# Patient Record
Sex: Male | Born: 1980
Health system: Southern US, Community
[De-identification: ages and names within clinical notes are randomized; demographics above are authoritative.]

## PROBLEM LIST (undated history)

## (undated) DIAGNOSIS — F418 Other specified anxiety disorders: Secondary | ICD-10-CM

## (undated) DIAGNOSIS — E785 Hyperlipidemia, unspecified: Secondary | ICD-10-CM

## (undated) DIAGNOSIS — I10 Essential (primary) hypertension: Secondary | ICD-10-CM

## (undated) DIAGNOSIS — Z903 Acquired absence of stomach [part of]: Secondary | ICD-10-CM

## (undated) DIAGNOSIS — R7303 Prediabetes: Secondary | ICD-10-CM

## (undated) DIAGNOSIS — Z8042 Family history of malignant neoplasm of prostate: Secondary | ICD-10-CM

## (undated) HISTORY — DX: Acquired absence of stomach (part of): Z90.3

## (undated) HISTORY — DX: Family history of malignant neoplasm of prostate: Z80.42

## (undated) HISTORY — DX: Hyperlipidemia, unspecified: E78.5

## (undated) HISTORY — DX: Other specified anxiety disorders: F41.8

## (undated) HISTORY — PX: WISDOM TOOTH EXTRACTION: SHX21

## (undated) HISTORY — DX: Essential (primary) hypertension: I10

---

## 2006-03-09 ENCOUNTER — Emergency Department (HOSPITAL_COMMUNITY): Admission: EM | Admit: 2006-03-09 | Discharge: 2006-03-09 | Payer: Self-pay | Admitting: Emergency Medicine

## 2012-12-15 ENCOUNTER — Encounter (HOSPITAL_COMMUNITY): Payer: Self-pay | Admitting: Emergency Medicine

## 2012-12-15 ENCOUNTER — Emergency Department (HOSPITAL_COMMUNITY)
Admission: EM | Admit: 2012-12-15 | Discharge: 2012-12-15 | Disposition: A | Discharge status: Home / Self Care | Attending: Emergency Medicine | Admitting: Emergency Medicine

## 2012-12-15 DIAGNOSIS — Y92410 Unspecified street and highway as the place of occurrence of the external cause: Secondary | ICD-10-CM | POA: Insufficient documentation

## 2012-12-15 DIAGNOSIS — S0990XA Unspecified injury of head, initial encounter: Secondary | ICD-10-CM | POA: Insufficient documentation

## 2012-12-15 DIAGNOSIS — I10 Essential (primary) hypertension: Secondary | ICD-10-CM | POA: Insufficient documentation

## 2012-12-15 DIAGNOSIS — S161XXA Strain of muscle, fascia and tendon at neck level, initial encounter: Secondary | ICD-10-CM

## 2012-12-15 DIAGNOSIS — Y9389 Activity, other specified: Secondary | ICD-10-CM | POA: Insufficient documentation

## 2012-12-15 DIAGNOSIS — S139XXA Sprain of joints and ligaments of unspecified parts of neck, initial encounter: Secondary | ICD-10-CM | POA: Insufficient documentation

## 2012-12-15 HISTORY — DX: Essential (primary) hypertension: I10

## 2012-12-15 MED ORDER — NAPROXEN 500 MG PO TABS: 500.0000 mg | ORAL_TABLET | Freq: Two times a day (BID) | ORAL | Status: DC
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
## 2012-12-16 NOTE — ED Provider Notes (Signed)
Medical screening examination/treatment/procedure(s) were performed by non-physician practitioner and as supervising physician I was immediately available for consultation/collaboration.   Gavin Pound. Oletta Lamas, MD 12/16/12 [phone]

## 2012-12-27 ENCOUNTER — Ambulatory Visit (INDEPENDENT_AMBULATORY_CARE_PROVIDER_SITE_OTHER): Payer: No Typology Code available for payment source | Admitting: Family Medicine

## 2012-12-27 VITALS — BP 142/92 | HR 84 | Temp 98.9°F | Resp 16 | Ht 76.0 in | Wt 358.0 lb

## 2012-12-27 DIAGNOSIS — Z2089 Contact with and (suspected) exposure to other communicable diseases: Secondary | ICD-10-CM

## 2012-12-27 DIAGNOSIS — Z202 Contact with and (suspected) exposure to infections with a predominantly sexual mode of transmission: Secondary | ICD-10-CM

## 2012-12-27 MED ORDER — METRONIDAZOLE 500 MG PO TABS: ORAL_TABLET | ORAL | Status: DC

## 2012-12-27 NOTE — Progress Notes (Signed)
  [facility]:  Office Visit  Chief Complaint:  Chief Complaint  Patient presents with  . Exposure to STD    HPI: Donald [name] is a 32 y.o. male who complains of: He needs to be treated for infection. His wife was treated for Trichomonas yesterday. She was having dc and went to her doctor. He realizes it is a sexually transmitted disease. He is in mongamous relationship with his wife, no one else. He deneis havoing any prior STDs, was tested along time ago.  He is asymtpomatic  Past Medical History  Diagnosis Date  . Hypertension    History reviewed. No pertinent past surgical history. History   Social History  . Marital Status: Married    Spouse Name: N/A    Number of Children: N/A  . Years of Education: N/A   Social History Main Topics  . Smoking status: Never Smoker   . Smokeless tobacco: None  . Alcohol Use: No  . Drug Use: None  . Sexual Activity: None   Other Topics Concern  . None   Social History Narrative  . None   Family History  Problem Relation Age of Onset  . Hyperlipidemia Mother   . Diabetes Mother   . Diabetes Father   . Prostate cancer Father   . Prostate cancer Paternal Grandfather    No Known Allergies Prior to Admission medications   Medication Sig Start Date End Date Taking? Authorizing Provider  naproxen (NAPROSYN) 500 MG tablet Take 1 tablet (500 mg total) by mouth 2 (two) times daily. 12/15/12   Trevor Mace, PA-C     ROS: The patient denies fevers, chills, night sweats, unintentional weight loss, chest pain, palpitations, wheezing, dyspnea on exertion, nausea, vomiting, abdominal pain, dysuria, hematuria, melena, numbness, weakness, or tingling.  All other systems have been reviewed and were otherwise negative with the exception of those mentioned in the HPI and as above.    PHYSICAL EXAM: Filed Vitals:   12/27/12 1815  BP: 142/92  Pulse: 84  Temp: 98.9 F (37.2 C)  Resp: 16   Filed Vitals:   12/27/12  1815  Height: 6\' 4"  (1.93 m)  Weight: 358 lb (162.388 kg)   Body mass index is 43.6 kg/(m^2).  General: Alert, no acute distress HEENT:  Normocephalic, atraumatic, oropharynx patent. EOMI, PERRLA Cardiovascular:  Regular rate and rhythm, no rubs murmurs or gallops.  No Carotid bruits, radial pulse intact. No pedal edema.  Respiratory: Clear to auscultation bilaterally.  No wheezes, rales, or rhonchi.  No cyanosis, no use of accessory musculature GI: No organomegaly, abdomen is soft and non-tender, positive bowel sounds.  No masses. Skin: No rashes. Neurologic: Facial musculature symmetric. Psychiatric: Patient is appropriate throughout our interaction. Lymphatic: No cervical lymphadenopathy Musculoskeletal: Gait intact.   LABS: No results found for this or any previous visit.   EKG/XRAY:   Primary read interpreted by Dr. Conley Rolls at [facility].   ASSESSMENT/PLAN: Encounter Diagnosis  Name Primary?  . Exposure to STD Yes   Rx Flagyl 500 mg 4 pills x 1 dose Labs pending: trich/G/C urine probe Gross sideeffects, risk and benefits, and alternatives of medications d/w patient. Patient is aware that all medications have potential sideeffects and we are unable to predict every sideeffect or drug-drug interaction that may occur.  Hamilton Capri PHUONG, DO 12/27/2012 6:31 PM

## 2012-12-27 NOTE — Patient Instructions (Signed)
Trichomoniasis °Trichomoniasis is an infection, caused by the Trichomonas organism, that affects both women and men. In women, the outer male genitalia and the vagina are affected. In men, the penis is mainly affected, but the prostate and other reproductive organs can also be involved. Trichomoniasis is a sexually transmitted disease (STD) and is most often passed to another person through sexual contact. The majority of people who get trichomoniasis do so from a sexual encounter and are also at risk for other STDs. °CAUSES  °· Sexual intercourse with an infected partner. °· It can be present in swimming pools or hot tubs. °SYMPTOMS  °· Abnormal gray-green frothy vaginal discharge in women. °· Vaginal itching and irritation in women. °· Itching and irritation of the area outside the vagina in women. °· Penile discharge with or without pain in males. °· Inflammation of the urethra (urethritis), causing painful urination. °· Bleeding after sexual intercourse. °RELATED COMPLICATIONS °· Pelvic inflammatory disease. °· Infection of the uterus (endometritis). °· Infertility. °· Tubal (ectopic) pregnancy. °· It can be associated with other STDs, including gonorrhea and chlamydia, hepatitis B, and HIV. °COMPLICATIONS DURING PREGNANCY °· Early (premature) delivery. °· Premature rupture of the membranes (PROM). °· Low birth weight. °DIAGNOSIS  °· Visualization of Trichomonas under the microscope from the vagina discharge. °· Ph of the vagina greater than 4.5, tested with a test tape. °· Trich Rapid Test. °· Culture of the organism, but this is not usually needed. °· It may be found on a Pap test. °· Having a "strawberry cervix,"which means the cervix looks very red like a strawberry. °TREATMENT  °· You may be given medication to fight the infection. Inform your caregiver if you could be or are pregnant. Some medications used to treat the infection should not be taken during pregnancy. °· Over-the-counter medications or  creams to decrease itching or irritation may be recommended. °· Your sexual partner will need to be treated if infected. °HOME CARE INSTRUCTIONS  °· Take all medication prescribed by your caregiver. °· Take over-the-counter medication for itching or irritation as directed by your caregiver. °· Do not have sexual intercourse while you have the infection. °· Do not douche or wear tampons. °· Discuss your infection with your partner, as your partner may have acquired the infection from you. Or, your partner may have been the person who transmitted the infection to you. °· Have your sex partner examined and treated if necessary. °· Practice safe, informed, and protected sex. °· See your caregiver for other STD testing. °SEEK MEDICAL CARE IF:  °· You still have symptoms after you finish the medication. °· You have an oral temperature above 102° F (38.9° C). °· You develop belly (abdominal) pain. °· You have pain when you urinate. °· You have bleeding after sexual intercourse. °· You develop a rash. °· The medication makes you sick or makes you throw up (vomit). °Document Released: 09/28/2000 Document Revised: 06/27/2011 Document Reviewed: 10/24/2008 °ExitCare® Patient Information ©2014 [employer]. ° °

## 2012-12-28 LAB — TRICHOMONAS VAGINALIS, PROBE AMP: T vaginalis RNA: NEGATIVE

## 2012-12-30 ENCOUNTER — Encounter: Payer: Self-pay | Admitting: Family Medicine

## 2013-01-02 LAB — GC/CHLAMYDIA PROBE AMP
CT Probe RNA: NEGATIVE
GC Probe RNA: NEGATIVE

## 2013-09-05 ENCOUNTER — Ambulatory Visit: Payer: Self-pay | Admitting: Family Medicine

## 2014-09-24 ENCOUNTER — Telehealth: Payer: Self-pay | Admitting: Family Medicine

## 2014-09-24 NOTE — Telephone Encounter (Signed)
Lab work ordered 09/09/14 through [employer] EMR have resulted as a hard copy from [facility].  This patient's most recent lab results have been reviewed and are within normal ranges including:  Blood counts Liver function Kidney function Thyroid function Prostate Antigen  Electrolytes Cholesterol  Hba1c is high at 6.3%, he is nearly diabetic! His Hba1c results in the past have been 6.0% and 5.6%, so this recent result is the highest it has been. I will keep an eye on this at least yearly with lab work.    I highly recommend that he really changes his diet around. Work on content of food and portion control consisting of low fat, low carb, low sugar diet along with regular DAILY exercise.   A copy of the results can be provided to the patient if they would like.

## 2014-09-25 NOTE — Telephone Encounter (Signed)
Per the request of Dr. Edwena Felty, I contacted this patient to review the results from this patient's recent blood work and after he verified his date of birth, labs were reviewed. Patient was encouraged to be mindful of what and how much he ate along with incorporating regular daily exercise. Patient then asked if I can set his appointment for next year. Patient has been scheduled for Wednesday, Sep 09, 2015 @ 8am.

## 2014-10-30 ENCOUNTER — Encounter: Payer: Self-pay | Admitting: Family Medicine

## 2015-01-27 ENCOUNTER — Ambulatory Visit: Payer: Self-pay | Admitting: Family Medicine

## 2015-01-27 ENCOUNTER — Other Ambulatory Visit: Payer: Self-pay | Admitting: Family Medicine

## 2015-01-27 DIAGNOSIS — E785 Hyperlipidemia, unspecified: Secondary | ICD-10-CM | POA: Insufficient documentation

## 2015-01-27 DIAGNOSIS — Z6841 Body Mass Index (BMI) 40.0 and over, adult: Principal | ICD-10-CM

## 2015-01-27 DIAGNOSIS — G4733 Obstructive sleep apnea (adult) (pediatric): Secondary | ICD-10-CM | POA: Insufficient documentation

## 2015-02-09 ENCOUNTER — Ambulatory Visit: Payer: Self-pay | Admitting: Family Medicine

## 2015-02-20 ENCOUNTER — Ambulatory Visit
Admission: RE | Admit: 2015-02-20 | Discharge: 2015-02-20 | Disposition: A | Discharge status: Home / Self Care | Payer: 59 | Source: Ambulatory Visit | Attending: Family Medicine | Admitting: Family Medicine

## 2015-02-20 ENCOUNTER — Ambulatory Visit (INDEPENDENT_AMBULATORY_CARE_PROVIDER_SITE_OTHER): Payer: 59 | Admitting: Family Medicine

## 2015-02-20 ENCOUNTER — Encounter: Payer: Self-pay | Admitting: Family Medicine

## 2015-02-20 VITALS — BP 122/90 | HR 91 | Temp 98.5°F | Resp 16 | Wt 357.3 lb

## 2015-02-20 DIAGNOSIS — M10032 Idiopathic gout, left wrist: Secondary | ICD-10-CM

## 2015-02-20 DIAGNOSIS — R946 Abnormal results of thyroid function studies: Secondary | ICD-10-CM | POA: Diagnosis not present

## 2015-02-20 DIAGNOSIS — H60339 Swimmer's ear, unspecified ear: Secondary | ICD-10-CM | POA: Insufficient documentation

## 2015-02-20 DIAGNOSIS — M79672 Pain in left foot: Secondary | ICD-10-CM | POA: Insufficient documentation

## 2015-02-20 DIAGNOSIS — E785 Hyperlipidemia, unspecified: Secondary | ICD-10-CM | POA: Diagnosis not present

## 2015-02-20 DIAGNOSIS — Z8042 Family history of malignant neoplasm of prostate: Secondary | ICD-10-CM | POA: Insufficient documentation

## 2015-02-20 DIAGNOSIS — M546 Pain in thoracic spine: Secondary | ICD-10-CM | POA: Insufficient documentation

## 2015-02-20 DIAGNOSIS — R030 Elevated blood-pressure reading, without diagnosis of hypertension: Secondary | ICD-10-CM | POA: Diagnosis not present

## 2015-02-20 DIAGNOSIS — R197 Diarrhea, unspecified: Secondary | ICD-10-CM | POA: Insufficient documentation

## 2015-02-20 DIAGNOSIS — M25532 Pain in left wrist: Secondary | ICD-10-CM | POA: Insufficient documentation

## 2015-02-20 DIAGNOSIS — R7303 Prediabetes: Secondary | ICD-10-CM

## 2015-02-20 DIAGNOSIS — E669 Obesity, unspecified: Secondary | ICD-10-CM | POA: Insufficient documentation

## 2015-02-20 DIAGNOSIS — G4730 Sleep apnea, unspecified: Secondary | ICD-10-CM | POA: Insufficient documentation

## 2015-02-20 DIAGNOSIS — M2062 Acquired deformities of toe(s), unspecified, left foot: Secondary | ICD-10-CM | POA: Insufficient documentation

## 2015-02-20 DIAGNOSIS — M1A09X0 Idiopathic chronic gout, multiple sites, without tophus (tophi): Secondary | ICD-10-CM | POA: Insufficient documentation

## 2015-02-20 DIAGNOSIS — F430 Acute stress reaction: Secondary | ICD-10-CM | POA: Insufficient documentation

## 2015-02-20 DIAGNOSIS — R7989 Other specified abnormal findings of blood chemistry: Secondary | ICD-10-CM | POA: Insufficient documentation

## 2015-02-20 DIAGNOSIS — Z23 Encounter for immunization: Secondary | ICD-10-CM

## 2015-02-20 MED ORDER — PREDNISONE 10 MG (21) PO TBPK: ORAL_TABLET | ORAL | Status: DC

## 2015-02-20 MED ORDER — IBUPROFEN 800 MG PO TABS: 800.0000 mg | ORAL_TABLET | Freq: Three times a day (TID) | ORAL | Status: DC | PRN

## 2015-02-20 MED ORDER — ALLOPURINOL 100 MG PO TABS: 100.0000 mg | ORAL_TABLET | Freq: Every day | ORAL | Status: DC

## 2015-02-20 NOTE — Progress Notes (Signed)
Name: Donald [name]   MRN: [id number]    DOB: December 20, 1980   Date:02/20/2015       Progress Note  Subjective  Chief Complaint  Chief Complaint  Patient presents with  . Gout    left foot pain, no injury. patient stated it has calmed down some.  . Wrist Pain    left wrist pain for 2 weeks. no swelling, but is sore. limited range of motion. patient does do repetitive motion at work Music therapist)    HPI   Donald [name] is a 34 year old male with complaints of left foot pain. He first saw me for left foot pain last year at which time a fracture was identified but not at the site where he reported pain (he reported pain around base of 1st digit). Relevent information includes history of left foot 5th digit base fracture 09/06/14 along with uric acid level 9.2 mg/dL on 1/47/82 as well. Donald [name] was referred to [facility] and he reports establishing care but did not receive a call about follow up so he never returned for further care. He is currently not on any chronic suppressive medication for Gout. He also complains of left wrist pain, onset 2 weeks ago, feels tight with limited ROM. No overt redness or swelling. Still works as a Paramedic. Still has not made significant strides with weight loss as previously recommended. He is requesting FMLA papers filled out as he is having gout attacks about every other month keeping his out of work about 3 days.   Past Medical History  Diagnosis Date  . Hypertension     Patient Active Problem List   Diagnosis Date Noted  . Acute thoracic back pain 02/20/2015  . Family history of malignant neoplasm of prostate 02/20/2015  . Benign hypertension 02/20/2015  . D (diarrhea) 02/20/2015  . Morbid obesity (HCC) 02/20/2015  . Beach ear 02/20/2015  . Arthralgia of foot 02/20/2015  . Chemical diabetes (HCC) 02/20/2015  . Acute situational disturbance 02/20/2015  . Apnea, sleep 02/20/2015  . Elevated TSH 02/20/2015  . Morbid obesity with BMI of  50.0-59.9, adult (HCC) 01/27/2015  . Obstructive sleep apnea 01/27/2015  . Hyperlipidemia LDL goal <100 01/27/2015    Social History  Substance Use Topics  . Smoking status: Never Smoker   . Smokeless tobacco: Not on file  . Alcohol Use: No    No current outpatient prescriptions on file.  No Known Allergies  Review of Systems  CONSTITUTIONAL: No significant weight changes, fever, chills, weakness or fatigue.  SKIN: No rash or itching.  CARDIOVASCULAR: No chest pain, chest pressure or chest discomfort. No palpitations or edema.  RESPIRATORY: No shortness of breath, cough or sputum.  NEUROLOGICAL: No headache, dizziness, syncope, paralysis, ataxia, numbness or tingling in the extremities. No memory changes. No change in bowel or bladder control.  MUSCULOSKELETAL: Yes joint pain. No muscle pain. PSYCHIATRIC: No change in mood. No change in sleep pattern.  ENDOCRINOLOGIC: No reports of sweating, cold or heat intolerance. No polyuria or polydipsia.      Objective  BP 122/90 mmHg  Pulse 91  Temp(Src) 98.5 F (36.9 C) (Oral)  Resp 16  Wt 357 lb 4.8 oz (162.07 kg)  SpO2 97%  Body mass index is 43.51 kg/(m^2).   Physical Exam  Constitutional: Patient has a large build and is obese and well-nourished. In no distress.  Cardiovascular: Normal rate, regular rhythm and normal heart sounds.  No murmur heard.  Pulmonary/Chest: Effort normal and breath  sounds normal. No respiratory distress. Musculoskeletal: Normal range of motion bilateral UE and LE, no joint effusions. Bilateral wrists, MCP, DIP, PIP joints full ROM with no swelling or nodules. Left foot no reproducible tenderness, no swelling at toe joints. Peripheral vascular: Bilateral LE no edema. Neurological: CN II-XII grossly intact with no focal deficits. Alert and oriented to person, place, and time. Coordination, balance, strength, speech and gait are normal.  Skin: Skin is warm and dry. No rash noted. No erythema.   Psychiatric: Patient has a normal mood and affect. Behavior is normal in office today. Judgment and thought content normal in office today.    Assessment & Plan   1. Left foot pain Repeat imaging and if fracture not well healed will refer back to podiatry.  - CBC with Differential/Platelet - Comprehensive metabolic panel - ANA - Rheumatoid factor - Sedimentation rate - Uric acid - DG Foot Complete Left; Future - ibuprofen (ADVIL,MOTRIN) 800 MG tablet; Take 1 tablet (800 mg total) by mouth every 8 (eight) hours as needed.  Dispense: 50 tablet; Refill: 3 - predniSONE (STERAPRED UNI-PAK 21 TAB) 10 MG (21) TBPK tablet; Use as directed in a 6 day taper PredPak  Dispense: 21 tablet; Refill: 0  2. Left wrist pain Start Ibuprofen for 3 days and wean down if symptoms improving. If not improving add on Prednisone taper.   - CBC with Differential/Platelet - Comprehensive metabolic panel - ANA - Rheumatoid factor - Sedimentation rate - Uric acid - DG Wrist Complete Left; Future - ibuprofen (ADVIL,MOTRIN) 800 MG tablet; Take 1 tablet (800 mg total) by mouth every 8 (eight) hours as needed.  Dispense: 50 tablet; Refill: 3 - predniSONE (STERAPRED UNI-PAK 21 TAB) 10 MG (21) TBPK tablet; Use as directed in a 6 day taper PredPak  Dispense: 21 tablet; Refill: 0  3. Acute idiopathic gout of left wrist Start allopurinol today. The patient has been counseled on the proper use, side effects and potential interactions of the new medication. Patient encouraged to review the side effects and safety profile pamphlet provided with the prescription from the pharmacy as well as request counseling from the pharmacy team as needed.   - CBC with Differential/Platelet - Comprehensive metabolic panel - ANA - Rheumatoid factor - Sedimentation rate - Uric acid - ibuprofen (ADVIL,MOTRIN) 800 MG tablet; Take 1 tablet (800 mg total) by mouth every 8 (eight) hours as needed.  Dispense: 50 tablet; Refill: 3 -  predniSONE (STERAPRED UNI-PAK 21 TAB) 10 MG (21) TBPK tablet; Use as directed in a 6 day taper PredPak  Dispense: 21 tablet; Refill: 0 - allopurinol (ZYLOPRIM) 100 MG tablet; Take 1 tablet (100 mg total) by mouth daily.  Dispense: 30 tablet; Refill: 3  4. Pre-diabetes  - Hemoglobin A1c  5. Elevated blood pressure reading without diagnosis of hypertension Diastolic borderline today.  - CBC with Differential/Platelet - Comprehensive metabolic panel  6. Hyperlipidemia LDL goal <100 Repeat.  - Lipid panel  7. Elevated TSH Previously elevated, antibodies negative 07/21/14. Subclinical hypothyroidism needs monitoring.   - TSH - T3, free - T4, free

## 2015-02-20 NOTE — Patient Instructions (Signed)

## 2015-02-21 LAB — CBC WITH DIFFERENTIAL/PLATELET
BASOS: 0 %
Basophils Absolute: 0 10*3/uL (ref 0.0–0.2)
EOS (ABSOLUTE): 0.1 10*3/uL (ref 0.0–0.4)
EOS: 2 %
HEMATOCRIT: 47.6 % (ref 37.5–51.0)
HEMOGLOBIN: 15.8 g/dL (ref 12.6–17.7)
Immature Grans (Abs): 0 10*3/uL (ref 0.0–0.1)
Immature Granulocytes: 0 %
LYMPHS ABS: 1.9 10*3/uL (ref 0.7–3.1)
Lymphs: 42 %
MCH: 25.8 pg — ABNORMAL LOW (ref 26.6–33.0)
MCHC: 33.2 g/dL (ref 31.5–35.7)
MCV: 78 fL — ABNORMAL LOW (ref 79–97)
MONOCYTES: 7 %
Monocytes Absolute: 0.3 10*3/uL (ref 0.1–0.9)
Neutrophils Absolute: 2.3 10*3/uL (ref 1.4–7.0)
Neutrophils: 49 %
Platelets: 218 10*3/uL (ref 150–379)
RBC: 6.13 x10E6/uL — AB (ref 4.14–5.80)
RDW: 16 % — ABNORMAL HIGH (ref 12.3–15.4)
WBC: 4.6 10*3/uL (ref 3.4–10.8)

## 2015-02-21 LAB — COMPREHENSIVE METABOLIC PANEL
ALBUMIN: 4.6 g/dL (ref 3.5–5.5)
ALT: 31 IU/L (ref 0–44)
AST: 17 IU/L (ref 0–40)
Albumin/Globulin Ratio: 1.8 (ref 1.1–2.5)
Alkaline Phosphatase: 80 IU/L (ref 39–117)
BUN / CREAT RATIO: 11 (ref 8–19)
BUN: 12 mg/dL (ref 6–20)
Bilirubin Total: 0.2 mg/dL (ref 0.0–1.2)
CALCIUM: 9.6 mg/dL (ref 8.7–10.2)
CO2: 26 mmol/L (ref 18–29)
CREATININE: 1.14 mg/dL (ref 0.76–1.27)
Chloride: 99 mmol/L (ref 97–106)
GFR calc Af Amer: 97 mL/min/{1.73_m2} (ref >59)
GFR, EST NON AFRICAN AMERICAN: 84 mL/min/{1.73_m2} (ref >59)
GLOBULIN, TOTAL: 2.5 g/dL (ref 1.5–4.5)
Glucose: 107 mg/dL — ABNORMAL HIGH (ref 65–99)
Potassium: 5 mmol/L (ref 3.5–5.2)
SODIUM: 140 mmol/L (ref 136–144)
Total Protein: 7.1 g/dL (ref 6.0–8.5)

## 2015-02-21 LAB — HEMOGLOBIN A1C
Est. average glucose Bld gHb Est-mCnc: 134 mg/dL
Hgb A1c MFr Bld: 6.3 % — ABNORMAL HIGH (ref 4.8–5.6)

## 2015-02-21 LAB — LIPID PANEL
CHOL/HDL RATIO: 4.2 ratio (ref 0.0–5.0)
Cholesterol, Total: 167 mg/dL (ref 100–199)
HDL: 40 mg/dL (ref >39)
LDL Calculated: 102 mg/dL — ABNORMAL HIGH (ref 0–99)
Triglycerides: 127 mg/dL (ref 0–149)
VLDL CHOLESTEROL CAL: 25 mg/dL (ref 5–40)

## 2015-02-21 LAB — URIC ACID: URIC ACID: 9.1 mg/dL — AB (ref 3.7–8.6)

## 2015-02-21 LAB — T4, FREE: Free T4: 1.01 ng/dL (ref 0.82–1.77)

## 2015-02-21 LAB — SEDIMENTATION RATE: Sed Rate: 5 mm/hr (ref 0–15)

## 2015-02-21 LAB — TSH: TSH: 3.17 u[IU]/mL (ref 0.450–4.500)

## 2015-02-21 LAB — RHEUMATOID FACTOR: Rhuematoid fact SerPl-aCnc: <10 IU/mL (ref 0.0–13.9)

## 2015-02-21 LAB — T3, FREE: T3, Free: 3.4 pg/mL (ref 2.0–4.4)

## 2015-02-22 LAB — ANA: ANA: NEGATIVE

## 2015-02-24 ENCOUNTER — Encounter: Payer: Self-pay | Admitting: Family Medicine

## 2015-02-24 ENCOUNTER — Telehealth: Payer: Self-pay

## 2015-02-24 NOTE — Telephone Encounter (Signed)
I contacted this patient to review the results from his recent labs. Patient informed me that he has been taking the Allopurinol 100mg  as directed. Patient was informed of lab results and asked if he could referred to a nutritionist or a dietician to help aid him in weight loss.   Also this patient mentioned that he turned his FMLA paperwork in too late, but was told by HR that if he has to be out again he just had have us complete the forms again. I told him that it must be documented and an appointment may be needed in order to complete the paperwork again.

## 2015-02-26 ENCOUNTER — Other Ambulatory Visit: Payer: Self-pay | Admitting: Family Medicine

## 2015-02-26 DIAGNOSIS — R7303 Prediabetes: Secondary | ICD-10-CM

## 2015-02-26 DIAGNOSIS — E785 Hyperlipidemia, unspecified: Secondary | ICD-10-CM

## 2015-02-26 DIAGNOSIS — M1A09X0 Idiopathic chronic gout, multiple sites, without tophus (tophi): Secondary | ICD-10-CM

## 2015-02-26 NOTE — Telephone Encounter (Signed)
Patient was informed and said thanks. 

## 2015-02-26 NOTE — Telephone Encounter (Signed)
Please let him know that I have [facility] for Nutrition Counseling.

## 2015-04-03 ENCOUNTER — Encounter: Payer: 59 | Attending: Family Medicine | Admitting: Dietician

## 2015-04-03 ENCOUNTER — Encounter: Payer: Self-pay | Admitting: Dietician

## 2015-04-03 VITALS — Ht 76.0 in | Wt 366.0 lb

## 2015-04-03 DIAGNOSIS — R7303 Prediabetes: Secondary | ICD-10-CM | POA: Insufficient documentation

## 2015-04-03 DIAGNOSIS — E669 Obesity, unspecified: Secondary | ICD-10-CM | POA: Insufficient documentation

## 2015-04-03 NOTE — Patient Instructions (Signed)
   Make healthy breakfast and snack choices. Try English muffin or whole grain bagel sandwiches and add fruit.   Try fruit with cheese stick or a handful of nuts for snacks, or make your own trail mix with whole grain cereal (like Cheerios) mixed with nuts and dried fruit.   Choose more grilled, baked foods for lunches. Add a salad or other vegetable or fruit.   When you feel ready, work on dinner by eating slowly, or try using a smaller plate, or try to stick with one plate rather than 2.   Begin some type of exercise on a regular basis. Start with a short duration, even 15 minutes, and increase from there.

## 2015-04-03 NOTE — Progress Notes (Signed)
Medical Nutrition Therapy: Visit start time: 0900  end time: 1000  Assessment:  Diagnosis: pre-diabetes, obesity Past medical history: gout, sleep apnea Psychosocial issues/ stress concerns: patient reports low stress level Preferred learning method:  . Auditory . Visual . Hands-on . No preference indicated  Current weight: 366lbs  Height: 6'4" Medications, supplements: reviewed with patient Progress and evaluation: Patient reports elevated BGs for the past year.          He has been relying on fast food and convenience store foods Donald [name]([employer]) for many meals, due to work.         He wants to lose weight to prevent diabetes.    Physical activity: some work-related (delivering packages); no structured exercise.  Dietary Intake:  Usual eating pattern includes 3 meals and 3 snacks per day. Dining out frequency: 12-14 meals per week.  Breakfast: 9am, fast food, such as biscuit or cold pizza,  or convenience store food Snack: cookies, deli sandwich, pop tarts Lunch: fast food Snack: same as am, or snack cakes Supper: chicken or pork chops, few vegetables, rice often Snack: often pop tarts; craving sweets recently Beverages: water virtually only water.   Nutrition Care Education: Topics covered: weight management, diabetes prevention Basic nutrition: basic food groups, appropriate nutrient balance, appropriate meal and snack schedule, general nutrition guidelines    Weight control: behavioral changes for weight loss: portion control strategies, reasonable goals; 2100kcal meal plan with 50%CHO, 20% protein, 30% fat.       Importance of low fat and low sugar foods, and increasing vegetable and fruit intake as well as fiber. Advanced nutrition:  dining out Diabetes prevention:  appropriate carb intake and balance, healthy carb choices, ways to decrease intake of sweets, healthy meal and snack options. Other lifestyle changes:  Role of physical exercise in weight management and diabetes  prevention  Nutritional Diagnosis:  Donald [name]-3.3 Overweight/obesity As related to excess caloric intake.  As evidenced by patient report, high BMI.  Intervention: Instruction as noted above.   Set goals with patient input; encouraged him to work on goals gradually if needed.      Education Materials given:  . General diet guidelines for Diabetes . Food lists/ Planning A Balanced Meal . Sample meal pattern/ menus: Quick and Healthy Meal Ideas . Goals/ instructions  Learner/ who was taught:  . Patient    Level of understanding: Donald [name] Kitchen. Verbalizes/ demonstrates competency  Demonstrated degree of understanding via:   Teach back Learning barriers: . None  Willingness to learn/ readiness for change: . Acceptance, ready for change   Monitoring and Evaluation:  Dietary intake, exercise, BG control, and body weight      follow up: 05/15/15

## 2015-05-15 ENCOUNTER — Encounter: Payer: 59 | Attending: Family Medicine | Admitting: Dietician

## 2015-05-15 DIAGNOSIS — E669 Obesity, unspecified: Secondary | ICD-10-CM | POA: Insufficient documentation

## 2015-05-15 DIAGNOSIS — R7303 Prediabetes: Secondary | ICD-10-CM | POA: Diagnosis not present

## 2015-05-15 NOTE — Patient Instructions (Signed)
   Keep up your great healthy changes!!  Continue to gradually increase physical activity, try adding some strength-building exercises like desk push-ups, etc. You can also try searching online for exercises coordinated for your fitness level.  Allow for occasional treats  You can aim for 1800 calories daily for weight loss, don't go much below that level.

## 2015-05-15 NOTE — Progress Notes (Signed)
Medical Nutrition Therapy: Visit start time: 1100  end time: 1130  Assessment:  Diagnosis: pre-diabetes, obesity Medical history changes: no changes Psychosocial issues/ stress concerns: none  Current weight: 355lbs  Height: 6'4" Medications, supplement changes: no changes  Progress and evaluation: Patient reports going 21 days without junk food as new year's resolution with some friends, did eat more last week after 21 days ended.          Weight loss of 11lbs since initial visit; he reports lowest weight of 349 last week, but has regained some since last week.         Increased vegetable and fruit intake, and choosing restaurants with healthy options.          Wife is cooking more meals at home and adding vegetables to meals.   Physical activity: minimal changes; has been trying to increase speed of walking when working.  Dietary Intake:  Usual eating pattern includes 3 meals and 2 snacks per day. Dining out frequency: 5-6 meals per week.  Breakfast: deli sandwich on whole wheat bread, banana or pineapple. Trying to keep to 320kcals.  Snack: none Lunch: subway or [employer], sometimes salad and baked chicken at [employer]. If caloric info available, aims for 400 or less.  Snack: none Supper: wife cooks; adding more vegetables. Chicken, pork chops; some smaller portions of starches. No calorie counting, but has kept to 1 - 1 1/2 plates of food rather than 2-3 plates he was previously eating. Snack: none Beverages: water  Nutrition Care Education: Topics covered: weight management Weight control: Reviewed caloric needs for weight loss; previously goal was 2100kcal. Advised patient to avoid going below 1800kcal daily to avoid "under" -eating.        Discussed additional ways to prepare and eat fruit as desserts. Discussed options for further increasing physical activity.   Nutritional Diagnosis:  [place]-3.3 Overweight/obesity As related to history of excess caloric intake, inactivity.  As  evidenced by patient report.  Intervention: Instruction as noted above.   Commended patient for changes and efforts made thus far.    Encouraged ongoing gradual increase in physical activity.      Education Materials given:  Marland Kitchen Goals/ instructions  Learner/ who was taught:  . Patient   Level of understanding: Marland Kitchen Verbalizes/ demonstrates competency  Demonstrated degree of understanding via:   Teach back Learning barriers: . None  Willingness to learn/ readiness for change: . Eager, change in progress  Monitoring and Evaluation:  Dietary intake, exercise, and body weight      follow up: 06/26/15

## 2015-06-26 ENCOUNTER — Encounter: Payer: Self-pay | Admitting: Family Medicine

## 2015-06-26 ENCOUNTER — Ambulatory Visit: Payer: Self-pay | Admitting: Dietician

## 2015-06-26 ENCOUNTER — Ambulatory Visit (INDEPENDENT_AMBULATORY_CARE_PROVIDER_SITE_OTHER): Payer: 59 | Admitting: Family Medicine

## 2015-06-26 VITALS — BP 132/90 | HR 86 | Temp 98.6°F | Resp 14 | Ht 76.0 in | Wt 360.0 lb

## 2015-06-26 DIAGNOSIS — M1A09X0 Idiopathic chronic gout, multiple sites, without tophus (tophi): Secondary | ICD-10-CM | POA: Diagnosis not present

## 2015-06-26 DIAGNOSIS — H6500 Acute serous otitis media, unspecified ear: Secondary | ICD-10-CM | POA: Insufficient documentation

## 2015-06-26 DIAGNOSIS — F418 Other specified anxiety disorders: Secondary | ICD-10-CM | POA: Diagnosis not present

## 2015-06-26 DIAGNOSIS — H6502 Acute serous otitis media, left ear: Secondary | ICD-10-CM | POA: Diagnosis not present

## 2015-06-26 DIAGNOSIS — R030 Elevated blood-pressure reading, without diagnosis of hypertension: Secondary | ICD-10-CM

## 2015-06-26 MED ORDER — DIAZEPAM 5 MG PO TABS: 5.0000 mg | ORAL_TABLET | Freq: Two times a day (BID) | ORAL | Status: DC | PRN

## 2015-06-26 MED ORDER — INDOMETHACIN 50 MG PO CAPS
50.0000 mg | ORAL_CAPSULE | Freq: Three times a day (TID) | ORAL | Status: DC | PRN
Start: 2015-06-26 — End: 2016-05-19

## 2015-06-26 MED ORDER — ALLOPURINOL 100 MG PO TABS: 100.0000 mg | ORAL_TABLET | Freq: Two times a day (BID) | ORAL | Status: DC

## 2015-06-26 MED ORDER — AMOXICILLIN-POT CLAVULANATE 875-125 MG PO TABS: 1.0000 | ORAL_TABLET | Freq: Two times a day (BID) | ORAL | Status: DC

## 2015-06-26 NOTE — Progress Notes (Signed)
Name: Donald [name]   MRN: [id number]    DOB: 04/30/1980   Date:06/26/2015       Progress Note  Subjective  Chief Complaint  Chief Complaint  Patient presents with  . Medication Refill    follow-up   . Gout  . URI    HPI  Patient is here today with concerns regarding the following symptoms sore throat, congestion, sneezing, ear pressure on the left, sinus pressure and non productive cough that started weeks ago.   Not associated with fever. Has tried the following home remedies: otc nyquil and mucinex.  Otherwise Donald [name] is taking his allopurinol 100 mg one a day as directed to gout prophylaxis. About 3 weeks ago he had a flare up in his left toe. He used 3 allopurinols but this did not help. Now resolved.  Flying to [place] next month and has flight anxiety. Previously Valium has helped, requesting refill.  Past Medical History  Diagnosis Date  . Hypertension   . Obesity, morbid, BMI 50 or higher (HCC)   . Hyperlipidemia LDL goal <100   . Sleep apnea   . Benign essential HTN   . TSH elevation   . Hypercalcemia   . Situational anxiety   . Acute thoracic back pain   . Intermittent diarrhea     Social History  Substance Use Topics  . Smoking status: Never Smoker   . Smokeless tobacco: Not on file  . Alcohol Use: 0.0 - 0.6 oz/week    0-1 Standard drinks or equivalent per week     Comment: socially     Current outpatient prescriptions:  .  allopurinol (ZYLOPRIM) 100 MG tablet, Take 1 tablet (100 mg total) by mouth daily., Disp: 30 tablet, Rfl: 3 .  chlorhexidine (PERIDEX) 0.12 % solution, , Disp: , Rfl: 12 .  ibuprofen (ADVIL,MOTRIN) 800 MG tablet, Take 1 tablet (800 mg total) by mouth every 8 (eight) hours as needed., Disp: 50 tablet, Rfl: 3  No Known Allergies  ROS  CONSTITUTIONAL: No significant weight changes, fever, chills, weakness or fatigue.  HEENT:  - Eyes: No visual changes.  - Ears: No auditory changes. Left ear pressure.  - Nose: Yes nasal  congestion with maxillary sinus pressure. - Throat: No sore throat. No changes in swallowing. SKIN: No rash or itching.  CARDIOVASCULAR: No chest pain, chest pressure or chest discomfort. No palpitations or edema.  RESPIRATORY: No shortness of breath, cough or sputum.  GASTROINTESTINAL: No anorexia, nausea, vomiting. No changes in bowel habits. No abdominal pain or blood.  GENITOURINARY: No dysuria. No frequency. No discharge.  NEUROLOGICAL: No headache, dizziness, syncope, paralysis, ataxia, numbness or tingling in the extremities. No memory changes. No change in bowel or bladder control.  MUSCULOSKELETAL: Occasional joint pain. No muscle pain. HEMATOLOGIC: No anemia, bleeding or bruising.  LYMPHATICS: No enlarged lymph nodes.  PSYCHIATRIC: No change in mood. No change in sleep pattern.  ENDOCRINOLOGIC: No reports of sweating, cold or heat intolerance. No polyuria or polydipsia.     Objective  Filed Vitals:   06/26/15 0812  BP: 132/90  Pulse: 86  Temp: 98.6 F (37 C)  TempSrc: Oral  Resp: 14  Height:  (1.93 m)  Weight: 360 lb (163.295 kg)  SpO2: 96%   Body mass index is 43.84 kg/(m^2).   Physical Exam  Constitutional: Patient is obese and well-nourished. In no acute distress but does appear to be fatigued from acute illness. HEENT:  - Head: Normocephalic and atraumatic.  - Ears: RIGHT  TM bulging with minimal clear exudate, LEFT TM bulging, red, with opaque exudate.  - Nose: Nasal mucosa boggy and congested.  - Mouth/Throat: Oropharynx is moist with slight erythema of bilateral tonsils without hypertrophy or exudates. Post nasal drainage present.  - Eyes: Conjunctivae clear, EOM movements normal. PERRLA. No scleral icterus.  Neck: Normal range of motion. Neck supple. No JVD present. No thyromegaly present. No local lymphadenopathy. Cardiovascular: Regular rate, regular rhythm with no murmurs heard.  Pulmonary/Chest: Effort normal and breath sounds clear in all lung  fields.  Musculoskeletal: Normal range of motion bilateral UE and LE, no joint effusions. Skin: Skin is warm and dry. No rash noted. Psychiatric: Patient has a normal mood and affect. Behavior is normal in office today. Judgment and thought content normal in office today.   Assessment & Plan  1. 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 tophus Re educated patient that NSAID is used for acute flares, and allopurinol is for daily use as a prophylactic medication. Will recheck uric acid levels. Increased allopurinol to 100mg  twice a day.  - indomethacin (INDOCIN) 50 MG capsule; Take 1 capsule (50 mg total) by mouth 3 (three) times daily as needed. For gout flares  Dispense: 30 capsule; Refill: 1 - Uric acid - allopurinol (ZYLOPRIM) 100 MG tablet; Take 1 tablet (100 mg total) by mouth 2 (two) times daily.  Dispense: 60 tablet; Refill: 5  2. Elevated blood pressure reading without diagnosis of hypertension Diastolic borderline today.  3. Acute serous otitis media of left ear, recurrence not specified  - amoxicillin-clavulanate (AUGMENTIN) 875-125 MG tablet; Take 1 tablet by mouth 2 (two) times daily.  Dispense: 20 tablet; Refill: 0  4. Situational anxiety Flight anxiety.  - diazepam (VALIUM) 5 MG tablet; Take 1-2 tablets (5-10 mg total) by mouth every 12 (twelve) hours as needed for anxiety.  Dispense: 10 tablet; Refill: 0

## 2015-06-27 LAB — URIC ACID: Uric Acid: 7.1 mg/dL (ref 3.7–8.6)

## 2015-07-24 ENCOUNTER — Encounter: Payer: Self-pay | Admitting: Dietician

## 2015-07-24 NOTE — Progress Notes (Signed)
Have not heard back from patient to reschedule appointment from 06/26/15. Sent discharge letter to MD.

## 2015-09-09 ENCOUNTER — Ambulatory Visit: Payer: Self-pay | Admitting: Family Medicine

## 2015-09-10 ENCOUNTER — Encounter: Payer: Self-pay | Admitting: Family Medicine

## 2015-09-10 ENCOUNTER — Ambulatory Visit (INDEPENDENT_AMBULATORY_CARE_PROVIDER_SITE_OTHER): Payer: 59 | Admitting: Family Medicine

## 2015-09-10 VITALS — BP 124/82 | HR 83 | Temp 99.2°F | Resp 16 | Wt 355.0 lb

## 2015-09-10 DIAGNOSIS — G4733 Obstructive sleep apnea (adult) (pediatric): Secondary | ICD-10-CM

## 2015-09-10 DIAGNOSIS — Z8042 Family history of malignant neoplasm of prostate: Secondary | ICD-10-CM

## 2015-09-10 DIAGNOSIS — R7303 Prediabetes: Secondary | ICD-10-CM

## 2015-09-10 DIAGNOSIS — Z0000 Encounter for general adult medical examination without abnormal findings: Secondary | ICD-10-CM | POA: Diagnosis not present

## 2015-09-10 DIAGNOSIS — M1A09X0 Idiopathic chronic gout, multiple sites, without tophus (tophi): Secondary | ICD-10-CM

## 2015-09-10 DIAGNOSIS — Z114 Encounter for screening for human immunodeficiency virus [HIV]: Secondary | ICD-10-CM | POA: Diagnosis not present

## 2015-09-10 HISTORY — DX: Family history of malignant neoplasm of prostate: Z80.42

## 2015-09-10 NOTE — Assessment & Plan Note (Signed)
encouraged pt to eat less meat, especially red meat

## 2015-09-10 NOTE — Progress Notes (Signed)
Patient ID: Donald [name], male   DOB: April 17, 1981, 35 y.o.   MRN: [id number]   Subjective:   Donald [name] is a 35 y.o. male here for a complete physical exam  Interim issues since last visit: gout flare earlier this week; he's interested in bariatric surgery  USPSTF grade A and B recommendations Alcohol: yes, but less than cutoff Depression: Depression screen [facility] 2/9 09/10/2015 06/26/2015 04/03/2015 02/20/2015  Decreased Interest 0 0 0 0  Down, Depressed, Hopeless 0 0 0 0  PHQ - 2 Score 0 0 0 0   Hypertension: controlled Obesity: yes; did manage to lose some weight by changing diet, but hard to maintain; considering bariatric surgery Tobacco use: NO HIV: check STD testing and prevention (chl/gon/syphilis):  Lipids: check today Glucose: check today Colorectal cancer: no Diet: room for improvement Exercise: no regular Skin cancer: nothing worrisome  Past Medical History  Diagnosis Date  . Hypertension   . Obesity, morbid, BMI 50 or higher (HCC)   . Hyperlipidemia LDL goal <100   . Sleep apnea   . Benign essential HTN   . TSH elevation   . Hypercalcemia   . Situational anxiety   . Acute thoracic back pain   . Intermittent diarrhea   . Family hx of prostate cancer 09/10/2015   No past surgical history on file.   Family History  Problem Relation Age of Onset  . Hyperlipidemia Mother   . Diabetes Mother   . Diabetes Father   . Prostate cancer Father   . Prostate cancer Paternal Grandfather   father was in his 67s when diagnosed with prostate cancer  Social History  Substance Use Topics  . Smoking status: Never Smoker   . Smokeless tobacco: Not on file  . Alcohol Use: 0.0 - 0.6 oz/week    0-1 Standard drinks or equivalent per week     Comment: socially   Review of Systems  Constitutional: Negative for unexpected weight change.  Gastrointestinal: Negative for constipation and blood in stool.  Genitourinary: Negative for hematuria, decreased urine volume  and difficulty urinating.  Musculoskeletal: Positive for arthralgias (left foot, gout attack earlier this week).       Gout, left foot; last episode was Monday, caught it early and it is fine  Neurological:       Sleep apnea    Objective:   Filed Vitals:   09/10/15 0832  BP: 124/82  Pulse: 83  Temp: 99.2 F (37.3 C)  TempSrc: Oral  Resp: 16  Weight: 355 lb (161.027 kg)  SpO2: 96%   Body mass index is 43.23 kg/(m^2). Wt Readings from Last 3 Encounters:  09/10/15 355 lb (161.027 kg)  06/26/15 360 lb (163.295 kg)  05/15/15 355 lb 14.4 oz (161.435 kg)   Physical Exam  Constitutional: He appears well-developed and well-nourished. No distress.  Morbidly obese  HENT:  Head: Normocephalic and atraumatic.  Right Ear: Hearing and external ear normal.  Left Ear: Hearing and external ear normal.  Nose: Nose normal. No rhinorrhea.  Mouth/Throat: Oropharynx is clear and moist. Mucous membranes are not dry.  Eyes: EOM are normal. No scleral icterus.  Neck: No JVD present. No thyromegaly present.  Cardiovascular: Normal rate, regular rhythm and normal heart sounds.   Pulmonary/Chest: Effort normal and breath sounds normal. No respiratory distress. He has no wheezes. He has no rales.  Abdominal: Soft. Bowel sounds are normal. He exhibits no distension. There is no tenderness. There is no guarding.  Musculoskeletal: Normal range of  motion. He exhibits no edema.  Lymphadenopathy:    He has no cervical adenopathy.  Neurological: He is alert. He displays normal reflexes. He exhibits normal muscle tone. Coordination normal.  Skin: Skin is warm and dry. No rash noted. He is not diaphoretic. No erythema. No pallor.  Skin tags around neck  Psychiatric: He has a normal mood and affect. His behavior is normal. Judgment and thought content normal.   Assessment/Plan:   Problem List Items Addressed This Visit      Respiratory   Obstructive sleep apnea    He reports using CPAP         Musculoskeletal and Integument   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 tophus    Check uric acid; acute attack earlier this week resolved; advised pt to cut down on foods rich in purines, especially gravies, chicken soup, beef stock, red meat      Relevant Orders   Uric acid     Other   Encounter for screening for HIV    Discussed one-time HIV screening recommendation per USPSTF guidelines; patient agrees with testing; HIV antibody ordered      Relevant Orders   HIV antibody   Family hx of prostate cancer    encouraged pt to eat less meat, especially red meat      Morbid obesity (HCC)    BMI is 43; refer to bariatric surgeon for consultation; see AVS for advice for weight loss      Relevant Orders   Ambulatory referral to General Surgery   Pre-diabetes    Check A1c and glucose (fasting) today; encouraged weight loss; referring to bariatric surgeon for consultations; encouraged activity      Relevant Orders   Hemoglobin A1c   Preventative health care - Primary    USPSTF grade A and B recommendations reviewed with patient; age-appropriate recommendations, preventive care, screening tests, etc discussed and encouraged; healthy living encouraged; see AVS for patient education given to patient      Relevant Orders   Comprehensive metabolic panel   Lipid Panel w/o Chol/HDL Ratio   TSH      No orders of the defined types were placed in this encounter.   Orders Placed This Encounter  Procedures  . Comprehensive metabolic panel    Order Specific Question:  Has the patient fasted?    Answer:  Yes  . Lipid Panel w/o Chol/HDL Ratio    Order Specific Question:  Has the patient fasted?    Answer:  Yes  . HIV antibody  . TSH  . Uric acid  . Hemoglobin A1c  . Ambulatory referral to General Surgery    Referral Priority:  Routine    Referral Type:  Surgical    Referral Reason:  Specialty Services Required    Requested Specialty:  General Surgery    Number of  Visits Requested:  1   Follow up plan: Return in about 1 year (around 09/09/2016) for complete physical; 6 months for prediabetes, chol. An after-visit summary was printed and given to the patient at check-out.  Please see the patient instructions which may contain other information and recommendations beyond what is mentioned above in the assessment and plan.

## 2015-09-10 NOTE — Assessment & Plan Note (Signed)
He reports using CPAP

## 2015-09-10 NOTE — Assessment & Plan Note (Addendum)
BMI is 43; refer to bariatric surgeon for consultation; see AVS for advice for weight loss

## 2015-09-10 NOTE — Assessment & Plan Note (Signed)
USPSTF grade A and B recommendations reviewed with patient; age-appropriate recommendations, preventive care, screening tests, etc discussed and encouraged; healthy living encouraged; see AVS for patient education given to patient  

## 2015-09-10 NOTE — Patient Instructions (Addendum)
Do cut down on red meat, as it is linked to higher risk of prostate cancer  Check out the information at familydoctor.org entitled "Nutrition for Weight Loss: What You Need to Know about Fad Diets" Try to lose between 1-2 pounds per week by taking in fewer calories and burning off more calories You can succeed by limiting portions, limiting foods dense in calories and fat, becoming more active, and drinking 8 glasses of water a day (64 ounces) Don't skip meals, especially breakfast, as skipping meals may alter your metabolism Do not use over-the-counter weight loss pills or gimmicks that claim rapid weight loss A healthy BMI (or body mass index) is between 18.5 and 24.9 You can calculate your ideal BMI at the NIH website [employer]huhttp://www.nhlbi.nih.gov/health/educational/lose_wt/BMI/bmicalc.htm  Avoid gravies and chicken soup and beef stock, limit red meat   Health Maintenance, Male A healthy lifestyle and preventative care can promote health and wellness.  Maintain regular health, dental, and eye exams.  Eat a healthy diet. Foods like vegetables, fruits, whole grains, low-fat dairy products, and lean protein foods contain the nutrients you need and are low in calories. Decrease your intake of foods high in solid fats, added sugars, and salt. Get information about a proper diet from your health care provider, if necessary.  Regular physical exercise is one of the most important things you can do for your health. Most adults should get at least 150 minutes of moderate-intensity exercise (any activity that increases your heart rate and causes you to sweat) each week. In addition, most adults need muscle-strengthening exercises on 2 or more days a week.   Maintain a healthy weight. The body mass index (BMI) is a screening tool to identify possible weight problems. It provides an estimate of body fat based on height and weight. Your health care provider can find your BMI and can help you achieve or  maintain a healthy weight. For males 20 years and older:  A BMI below 18.5 is considered underweight.  A BMI of 18.5 to 24.9 is normal.  A BMI of 25 to 29.9 is considered overweight.  A BMI of 30 and above is considered obese.  Maintain normal blood lipids and cholesterol by exercising and minimizing your intake of saturated fat. Eat a balanced diet with plenty of fruits and vegetables. Blood tests for lipids and cholesterol should begin at age 34 and be repeated every 5 years. If your lipid or cholesterol levels are high, you are over age 34, or you are at high risk for heart disease, you may need your cholesterol levels checked more frequently.Ongoing high lipid and cholesterol levels should be treated with medicines if diet and exercise are not working.  If you smoke, find out from your health care provider how to quit. If you do not use tobacco, do not start.  Lung cancer screening is recommended for adults aged 55-80 years who are at high risk for developing lung cancer because of a history of smoking. A yearly low-dose CT scan of the lungs is recommended for people who have at least a 30-pack-year history of smoking and are current smokers or have quit within the past 15 years. A pack year of smoking is smoking an average of 1 pack of cigarettes a day for 1 year (for example, a 30-pack-year history of smoking could mean smoking 1 pack a day for 30 years or 2 packs a day for 15 years). Yearly screening should continue until the smoker has stopped smoking for at least 15  years. Yearly screening should be stopped for people who develop a health problem that would prevent them from having lung cancer treatment.  If you choose to drink alcohol, do not have more than 2 drinks per day. One drink is considered to be 12 oz (360 mL) of beer, 5 oz (150 mL) of wine, or 1.5 oz (45 mL) of liquor.  Avoid the use of street drugs. Do not share needles with anyone. Ask for help if you need support or  instructions about stopping the use of drugs.  High blood pressure causes heart disease and increases the risk of stroke. High blood pressure is more likely to develop in:  People who have blood pressure in the end of the normal range (100-139/85-89 mm Hg).  People who are overweight or obese.  People who are African American.  If you are 56-72 years of age, have your blood pressure checked every 3-5 years. If you are 12 years of age or older, have your blood pressure checked every year. You should have your blood pressure measured twice--once when you are at a hospital or clinic, and once when you are not at a hospital or clinic. Record the average of the two measurements. To check your blood pressure when you are not at a hospital or clinic, you can use:  An automated blood pressure machine at a pharmacy.  A home blood pressure monitor.  If you are 37-29 years old, ask your health care provider if you should take aspirin to prevent heart disease.  Diabetes screening involves taking a blood sample to check your fasting blood sugar level. This should be done once every 3 years after age 44 if you are at a normal weight and without risk factors for diabetes. Testing should be considered at a younger age or be carried out more frequently if you are overweight and have at least 1 risk factor for diabetes.  Colorectal cancer can be detected and often prevented. Most routine colorectal cancer screening begins at the age of 57 and continues through age 28. However, your health care provider may recommend screening at an earlier age if you have risk factors for colon cancer. On a yearly basis, your health care provider may provide home test kits to check for hidden blood in the stool. A small camera at the end of a tube may be used to directly examine the colon (sigmoidoscopy or colonoscopy) to detect the earliest forms of colorectal cancer. Talk to your health care provider about this at age 17 when  routine screening begins. A direct exam of the colon should be repeated every 5-10 years through age 51, unless early forms of precancerous polyps or small growths are found.  People who are at an increased risk for hepatitis B should be screened for this virus. You are considered at high risk for hepatitis B if:  You were born in a country where hepatitis B occurs often. Talk with your health care provider about which countries are considered high risk.  Your parents were born in a high-risk country and you have not received a shot to protect against hepatitis B (hepatitis B vaccine).  You have HIV or AIDS.  You use needles to inject street drugs.  You live with, or have sex with, someone who has hepatitis B.  You are a man who has sex with other men (MSM).  You get hemodialysis treatment.  You take certain medicines for conditions like cancer, organ transplantation, and autoimmune conditions.  Hepatitis  C blood testing is recommended for all people born from [birth year] through 1964 and any individual with known risk factors for hepatitis C.  Healthy men should no longer receive prostate-specific antigen (PSA) blood tests as part of routine cancer screening. Talk to your health care provider about prostate cancer screening.  Testicular cancer screening is not recommended for adolescents or adult males who have no symptoms. Screening includes self-exam, a health care provider exam, and other screening tests. Consult with your health care provider about any symptoms you have or any concerns you have about testicular cancer.  Practice safe sex. Use condoms and avoid high-risk sexual practices to reduce the spread of sexually transmitted infections (STIs).  You should be screened for STIs, including gonorrhea and chlamydia if:  You are sexually active and are younger than 24 years.  You are older than 24 years, and your health care provider tells you that you are at risk for this type of  infection.  Your sexual activity has changed since you were last screened, and you are at an increased risk for chlamydia or gonorrhea. Ask your health care provider if you are at risk.  If you are at risk of being infected with HIV, it is recommended that you take a prescription medicine daily to prevent HIV infection. This is called pre-exposure prophylaxis (PrEP). You are considered at risk if:  You are a man who has sex with other men (MSM).  You are a heterosexual man who is sexually active with multiple partners.  You take drugs by injection.  You are sexually active with a partner who has HIV.  Talk with your health care provider about whether you are at high risk of being infected with HIV. If you choose to begin PrEP, you should first be tested for HIV. You should then be tested every 3 months for as long as you are taking PrEP.  Use sunscreen. Apply sunscreen liberally and repeatedly throughout the day. You should seek shade when your shadow is shorter than you. Protect yourself by wearing long sleeves, pants, a wide-brimmed hat, and sunglasses year round whenever you are outdoors.  Tell your health care provider of new moles or changes in moles, especially if there is a change in shape or color. Also, tell your health care provider if a mole is larger than the size of a pencil eraser.  A one-time screening for abdominal aortic aneurysm (AAA) and surgical repair of large AAAs by ultrasound is recommended for men aged 65-75 years who are current or former smokers.  Stay current with your vaccines (immunizations).   This information is not intended to replace advice given to you by your health care provider. Make sure you discuss any questions you have with your health care provider.   Document Released: 10/01/2007 Document Revised: 04/25/2014 Document Reviewed: 08/30/2010 Elsevier Interactive Patient Education [employer]

## 2015-09-10 NOTE — Assessment & Plan Note (Addendum)
Check A1c and glucose (fasting) today; encouraged weight loss; referring to bariatric surgeon for consultations; encouraged activity

## 2015-09-10 NOTE — Assessment & Plan Note (Addendum)
Check uric acid; acute attack earlier this week resolved; advised pt to cut down on foods rich in purines, especially gravies, chicken soup, beef stock, red meat

## 2015-09-10 NOTE — Assessment & Plan Note (Signed)
Discussed one-time HIV screening recommendation per USPSTF guidelines; patient agrees with testing; HIV antibody ordered 

## 2015-09-11 LAB — HGB A1C W/O EAG: Hgb A1c MFr Bld: 6.4 % — ABNORMAL HIGH (ref 4.8–5.6)

## 2015-09-11 LAB — URIC ACID: Uric Acid: 7.8 mg/dL (ref 3.7–8.6)

## 2015-09-11 LAB — TSH: TSH: 4.63 u[IU]/mL — AB (ref 0.450–4.500)

## 2015-09-11 LAB — COMPREHENSIVE METABOLIC PANEL
ALBUMIN: 4.6 g/dL (ref 3.5–5.5)
ALK PHOS: 84 IU/L (ref 39–117)
ALT: 26 IU/L (ref 0–44)
AST: 17 IU/L (ref 0–40)
Albumin/Globulin Ratio: 1.6 (ref 1.2–2.2)
BILIRUBIN TOTAL: 0.3 mg/dL (ref 0.0–1.2)
BUN / CREAT RATIO: 10 (ref 9–20)
BUN: 11 mg/dL (ref 6–20)
CO2: 26 mmol/L (ref 18–29)
CREATININE: 1.14 mg/dL (ref 0.76–1.27)
Calcium: 9.5 mg/dL (ref 8.7–10.2)
Chloride: 96 mmol/L (ref 96–106)
GFR calc non Af Amer: 83 mL/min/{1.73_m2} (ref >59)
GFR, EST AFRICAN AMERICAN: 96 mL/min/{1.73_m2} (ref >59)
GLOBULIN, TOTAL: 2.9 g/dL (ref 1.5–4.5)
Glucose: 103 mg/dL — ABNORMAL HIGH (ref 65–99)
Potassium: 4.5 mmol/L (ref 3.5–5.2)
SODIUM: 139 mmol/L (ref 134–144)
Total Protein: 7.5 g/dL (ref 6.0–8.5)

## 2015-09-11 LAB — LIPID PANEL W/O CHOL/HDL RATIO
CHOLESTEROL TOTAL: 176 mg/dL (ref 100–199)
HDL: 52 mg/dL (ref >39)
LDL CALC: 104 mg/dL — AB (ref 0–99)
Triglycerides: 99 mg/dL (ref 0–149)
VLDL Cholesterol Cal: 20 mg/dL (ref 5–40)

## 2015-09-11 LAB — HIV ANTIBODY (ROUTINE TESTING W REFLEX): HIV SCREEN 4TH GENERATION: NONREACTIVE

## 2015-10-08 ENCOUNTER — Encounter: Payer: Self-pay | Admitting: Dietician

## 2015-10-08 ENCOUNTER — Encounter: Payer: 59 | Attending: General Surgery | Admitting: Dietician

## 2015-10-08 DIAGNOSIS — Z713 Dietary counseling and surveillance: Secondary | ICD-10-CM | POA: Insufficient documentation

## 2015-10-08 NOTE — Progress Notes (Signed)
  Pre-Op Assessment Visit:  Pre-Operative Sleeve gastrectomy Surgery  Medical Nutrition Therapy:  Appt start time: 0820   End time:  0900.  Patient was seen on 10/08/2015 for Pre-Operative Nutrition Assessment. [facility] on 10/08/2015.   Preferred Learning Style:   No preference indicated   Learning Readiness:   Ready  Handouts given during visit include:  Pre-Op Goals Bariatric Surgery Protein Shakes   During the appointment today the following Pre-Op Goals were reviewed with the patient: Maintain or lose weight as instructed by your surgeon Make healthy food choices Begin to limit portion sizes Limited concentrated sugars and fried foods Keep fat/sugar in the single digits per serving on   food labels Practice CHEWING your food  (aim for 30 chews per bite or until applesauce consistency) Practice not drinking 15 minutes before, during, and 30 minutes after each meal/snack Avoid all carbonated beverages  Avoid/limit caffeinated beverages  Avoid all sugar-sweetened beverages Consume 3 meals per day; eat every 3-5 hours Make a list of non-food related activities Aim for 64-100 ounces of FLUID daily  Aim for at least 60-80 grams of PROTEIN daily Look for a liquid protein source that contain ?15 g protein and ?5 g carbohydrate  (ex: shakes, drinks, shots)  Demonstrated degree of understanding via:  Teach Back  Teaching Method Utilized:  Visual Auditory Hands on  Barriers to learning/adherence to lifestyle change: none  [facility] to [facility] to enroll in Pre-Op and Post-Op Nutrition Education when surgery date is scheduled.

## 2015-10-09 ENCOUNTER — Other Ambulatory Visit (HOSPITAL_COMMUNITY): Payer: Self-pay | Admitting: General Surgery

## 2015-10-09 ENCOUNTER — Telehealth: Payer: Self-pay | Admitting: Family Medicine

## 2015-10-14 ENCOUNTER — Encounter: Payer: 59 | Admitting: Dietician

## 2015-10-14 ENCOUNTER — Encounter: Payer: Self-pay | Admitting: Dietician

## 2015-10-14 NOTE — Progress Notes (Signed)
Supervised weight loss:  Appt start time: 1200 end time:  1215.  SWL visit 1:  Primary concerns today: Mr. Donald [name] returns having lost 7 pounds since his last visit. He states he has been following a high protein, low carb diet. Has also been trying to chew thoroughly and avoid drinking while eating. Bought some whey protein powder that meets the protein and carb criteria and likes it.   Weight: 349 lbs BMI: 41.5  MEDICATIONS: see list  DIETARY INTAKE:  24-hr recall:  B ( AM): protein shake  Snk ( AM): deli meat and cheese  L ( PM): chicken breast and zucchini   Snk ( PM):  D ( PM): BBQ chicken with salad  Snk ( PM): another protein shake  Beverages: water, low sugar nut milk with powder protein shake  Recent physical activity: none  Estimated energy needs: 2000-2200 calories  Progress Towards Goal(s):  In progress.   Nutritional Diagnosis:  [place]-3.3 Overweight/obesity related to past poor dietary habits and physical inactivity as evidenced by patient in SWL for pending surgery following dietary guidelines for continued weight loss.     Intervention:  Nutrition counseling provided.  Goals: -Continue to have a protein food with each meal and snack  Handouts given during visit include:  none  Monitoring/Evaluation:  Dietary intake, exercise, and body weight in 4 week(s).

## 2015-10-14 NOTE — Telephone Encounter (Signed)
errenous °

## 2015-10-14 NOTE — Patient Instructions (Signed)
-  Continue to have a protein food with each meal and snack

## 2015-10-26 ENCOUNTER — Other Ambulatory Visit: Payer: Self-pay

## 2015-10-26 ENCOUNTER — Ambulatory Visit (HOSPITAL_COMMUNITY): Payer: No Typology Code available for payment source

## 2015-10-26 ENCOUNTER — Ambulatory Visit (HOSPITAL_COMMUNITY)
Admission: RE | Admit: 2015-10-26 | Discharge: 2015-10-26 | Disposition: A | Discharge status: Home / Self Care | Payer: 59 | Source: Ambulatory Visit | Attending: General Surgery | Admitting: General Surgery

## 2015-11-09 ENCOUNTER — Encounter: Payer: Self-pay | Admitting: Dietician

## 2015-11-09 ENCOUNTER — Ambulatory Visit (INDEPENDENT_AMBULATORY_CARE_PROVIDER_SITE_OTHER): Payer: 59 | Admitting: Licensed Clinical Social Worker

## 2015-11-09 ENCOUNTER — Encounter: Payer: 59 | Attending: General Surgery | Admitting: Dietician

## 2015-11-09 DIAGNOSIS — Z713 Dietary counseling and surveillance: Secondary | ICD-10-CM | POA: Insufficient documentation

## 2015-11-09 DIAGNOSIS — F5081 Binge eating disorder: Secondary | ICD-10-CM | POA: Diagnosis not present

## 2015-11-09 NOTE — Progress Notes (Signed)
Supervised weight loss:  Appt start time: 1000 end time:  1015.  SWL visit 2:  Primary concerns today: Donald [name] returns having lost another 5 pounds since his last visit. He continues to follow a high protein, low carb diet. No longer likes his protein shake. We discussed finding a new shake and having it less often to avoid burnout.    Weight: 344.3 lbs BMI: 41  MEDICATIONS: see list  DIETARY INTAKE:  24-hr recall:  B ( AM): protein shake  Snk ( AM): deli meat and cheese  L ( PM): chicken breast and zucchini   Snk ( PM):  D ( PM): BBQ chicken with salad  Snk ( PM): another protein shake  Beverages: water, low sugar nut milk with powder protein shake  Recent physical activity: none  Estimated energy needs: 2000-2200 calories  Progress Towards Goal(s):  In progress.   Nutritional Diagnosis:  [place]-3.3 Overweight/obesity related to past poor dietary habits and physical inactivity as evidenced by patient in SWL for pending surgery following dietary guidelines for continued weight loss.     Intervention:  Nutrition counseling provided.  Goals: -Continue to have a protein food with each meal and snack -Find a new approved protein shake  Handouts given during visit include:  none  Monitoring/Evaluation:  Dietary intake, exercise, and body weight in 4 week(s).

## 2015-11-09 NOTE — Patient Instructions (Signed)
-  Continue to have a protein food with each meal and snack -Find a new protein shake that you like  -Avoid having shake every day so you don't get tired of it

## 2015-11-25 ENCOUNTER — Ambulatory Visit (INDEPENDENT_AMBULATORY_CARE_PROVIDER_SITE_OTHER): Payer: 59 | Admitting: Licensed Clinical Social Worker

## 2015-12-11 ENCOUNTER — Encounter: Payer: Self-pay | Admitting: Skilled Nursing Facility1

## 2015-12-11 ENCOUNTER — Encounter: Payer: 59 | Attending: General Surgery | Admitting: Skilled Nursing Facility1

## 2015-12-11 DIAGNOSIS — Z713 Dietary counseling and surveillance: Secondary | ICD-10-CM | POA: Diagnosis not present

## 2015-12-11 NOTE — Progress Notes (Signed)
Supervised weight loss:  Appt start time: 1000 end time:  1015.  SWL visit 2:  Primary concerns today: Donald [name] returns having gained 6 pounds since his last visit. Pt states he has not found a new protein shake.  Pt states he joined a gym and drinks a protein shake after he works out. Pt states he went on a cruise so he was expecting wt gain. Pt states the drinking and chewing recommendations have been tough and he has trouble remembering to do them.   Physical activity: swimming, lifting, elliptical 3 days a week for 45 minutes to a hour.    Weight: 344.3 lbs BMI: 41  MEDICATIONS: see list  DIETARY INTAKE:  24-hr recall:  B ( AM): protein shake  Snk ( AM): deli meat and cheese  L ( PM): chicken breast and zucchini   Snk ( PM):  D ( PM): BBQ chicken with salad  Snk ( PM): another protein shake  Beverages: water, low sugar nut milk with powder protein shake  Recent physical activity: none  Estimated energy needs: 2000-2200 calories  Progress Towards Goal(s):  In progress.   Nutritional Diagnosis:  [place]-3.3 Overweight/obesity related to past poor dietary habits and physical inactivity as evidenced by patient in SWL for pending surgery following dietary guidelines for continued weight loss.     Intervention:  Nutrition counseling provided.  Goals: -Continue to have a protein food with each meal and snack -Find a new approved protein shake  Handouts given during visit include:  none  Monitoring/Evaluation:  Dietary intake, exercise, and body weight in 4 week(s).

## 2016-01-06 ENCOUNTER — Encounter: Payer: 59 | Attending: General Surgery | Admitting: Dietician

## 2016-01-06 ENCOUNTER — Encounter: Payer: Self-pay | Admitting: Dietician

## 2016-01-06 DIAGNOSIS — Z713 Dietary counseling and surveillance: Secondary | ICD-10-CM | POA: Insufficient documentation

## 2016-01-06 NOTE — Progress Notes (Signed)
Supervised weight loss:  Appt start time: 1110 end time:  1125  SWL visit 5: (Patient began SWL visit in May 2017).  Primary concerns today: Donald [name] returns having lost 2 pounds since his last visit. He has been working to return his weight to normal since his cruise this summer. Has been drinking a chocolate protein shake and likes it. He confirms that the shake he chose has 26 grams of protein but 10 grams of carbs. He is excited about surgery and feels ready. He is most concerned about not being able to gulp water after surgery since he works outside.   Weight: 348 lbs BMI: 42.4  MEDICATIONS: see list  DIETARY INTAKE:  24-hr recall:  B ( AM): sometimes skips, meat and cheese Snk ( AM): deli meat and cheese  L ( PM): Core protein shake Snk ( PM):  D ( PM): BBQ chicken with salad  Snk ( PM): another protein shake  Beverages: water, low sugar nut milk with powder protein shake  Physical activity: swimming, lifting, elliptical 3 days a week for 45 minutes to a hour.  Estimated energy needs: 2000-2200 calories  Progress Towards Goal(s):  In progress.   Nutritional Diagnosis:  [place]-3.3 Overweight/obesity related to past poor dietary habits and physical inactivity as evidenced by patient in SWL for pending surgery following dietary guidelines for continued weight loss.     Intervention:  Nutrition counseling provided.  Goals: -Continue to have a protein food with each meal and snack -Find a new approved protein shake  Handouts given during visit include:  none  Monitoring/Evaluation:  Dietary intake, exercise, and body weight in 4 week(s).

## 2016-01-06 NOTE — Patient Instructions (Signed)
-  Continue to have a protein food with each meal and snack -Find a new protein shake that you like  -Avoid having shake every day so you don't get tired of it 

## 2016-01-19 ENCOUNTER — Telehealth: Payer: Self-pay | Admitting: Family Medicine

## 2016-01-21 NOTE — Telephone Encounter (Signed)
COMPLETED

## 2016-01-22 ENCOUNTER — Ambulatory Visit: Payer: 59 | Admitting: Family Medicine

## 2016-02-01 ENCOUNTER — Ambulatory Visit (INDEPENDENT_AMBULATORY_CARE_PROVIDER_SITE_OTHER): Payer: 59 | Admitting: Family Medicine

## 2016-02-01 ENCOUNTER — Encounter: Payer: Self-pay | Admitting: Family Medicine

## 2016-02-01 VITALS — BP 128/80 | HR 93 | Temp 99.1°F | Resp 16 | Ht 76.0 in | Wt 356.1 lb

## 2016-02-01 DIAGNOSIS — E785 Hyperlipidemia, unspecified: Secondary | ICD-10-CM

## 2016-02-01 DIAGNOSIS — R946 Abnormal results of thyroid function studies: Secondary | ICD-10-CM

## 2016-02-01 DIAGNOSIS — R7303 Prediabetes: Secondary | ICD-10-CM

## 2016-02-01 DIAGNOSIS — R5383 Other fatigue: Secondary | ICD-10-CM | POA: Diagnosis not present

## 2016-02-01 DIAGNOSIS — Z23 Encounter for immunization: Secondary | ICD-10-CM | POA: Diagnosis not present

## 2016-02-01 DIAGNOSIS — M1A09X0 Idiopathic chronic gout, multiple sites, without tophus (tophi): Secondary | ICD-10-CM

## 2016-02-01 DIAGNOSIS — G4733 Obstructive sleep apnea (adult) (pediatric): Secondary | ICD-10-CM

## 2016-02-01 DIAGNOSIS — R7989 Other specified abnormal findings of blood chemistry: Secondary | ICD-10-CM

## 2016-02-01 LAB — CBC WITH DIFFERENTIAL/PLATELET
BASOS PCT: 1 %
Basophils Absolute: 71 cells/uL (ref 0–200)
EOS ABS: 71 {cells}/uL (ref 15–500)
EOS PCT: 1 %
HCT: 48.4 % (ref 38.5–50.0)
Hemoglobin: 15.9 g/dL (ref 13.2–17.1)
LYMPHS PCT: 30 %
Lymphs Abs: 2130 cells/uL (ref 850–3900)
MCH: 25.9 pg — AB (ref 27.0–33.0)
MCHC: 32.9 g/dL (ref 32.0–36.0)
MCV: 79 fL — AB (ref 80.0–100.0)
MONOS PCT: 8 %
MPV: 10.8 fL (ref 7.5–12.5)
Monocytes Absolute: 568 cells/uL (ref 200–950)
Neutro Abs: 4260 cells/uL (ref 1500–7800)
Neutrophils Relative %: 60 %
PLATELETS: 229 10*3/uL (ref 140–400)
RBC: 6.13 MIL/uL — ABNORMAL HIGH (ref 4.20–5.80)
RDW: 15.9 % — AB (ref 11.0–15.0)
WBC: 7.1 10*3/uL (ref 3.8–10.8)

## 2016-02-01 LAB — TSH: TSH: 2.82 mIU/L (ref 0.40–4.50)

## 2016-02-01 NOTE — Assessment & Plan Note (Signed)
Check vit D 

## 2016-02-01 NOTE — Progress Notes (Signed)
BP 128/80 (BP Location: Left Arm, Patient Position: Sitting, Cuff Size: Large)   Pulse 93   Temp 99.1 F (37.3 C) (Oral)   Resp 16   Ht 6\' 4"  (1.93 m)   Wt (!) 356 lb 2 oz (161.5 kg)   SpO2 94%   BMI 43.35 kg/m    Subjective:    Patient ID: Donald [name], male    DOB: 1981-02-18, 34 y.o.   MRN: [id number]  HPI: Donald [name] is a 35 y.o. male  Chief Complaint  Patient presents with  . Medication Refill  . Follow-up    redue labs for thyroid    He had an abnormal thyroid level in May; TSH was 4.630 on Sep 10, 2015 Since last visit, energy level has been down, feels more tired; no known thyroid diseease in the fam Really tired the last two weeks No dry skin; hair dry; no constipation  He also has prediabetes; last A1c on Sep 10, 2015 was 6.4 Very strong family hx of diabetes; mother's side (GF, aunts, uncle) and father No dry mouth; no blurred vision; no urinary frequency at night  He is undergoing work-up for weight loss, bariatric surgery with doctor at [facility]; he has been trying to change his diet  He has sleep apnea; he wears the mask; he has had it for 16 years  Gout flares, 2-3 days at a time; uses 1x every 2-3 months; social drinker  Depression screen [facility] 2/9 02/01/2016 01/06/2016 11/09/2015 10/14/2015 10/08/2015  Decreased Interest 0 0 0 0 0  Down, Depressed, Hopeless 0 0 0 0 0  PHQ - 2 Score 0 0 0 0 0   Relevant past medical, surgical, family and social history reviewed Past Medical History:  Diagnosis Date  . Acute thoracic back pain   . Benign essential HTN   . Family hx of prostate cancer 09/10/2015  . Hypercalcemia   . Hyperlipidemia LDL goal <100   . Hypertension   . Intermittent diarrhea   . Obesity, morbid, BMI 50 or higher (HCC)   . Situational anxiety   . Sleep apnea   . TSH elevation    History reviewed. No pertinent surgical history.   Family History  Problem Relation Age of Onset  . Hyperlipidemia Mother   . Diabetes Mother    . Diabetes Father   . Prostate cancer Father   . Prostate cancer Paternal Grandfather    Social History  Substance Use Topics  . Smoking status: Never Smoker  . Smokeless tobacco: Not on file  . Alcohol use 0.0 - 0.6 oz/week     Comment: socially   Interim medical history since last visit reviewed. Allergies and medications reviewed  Review of Systems Per HPI unless specifically indicated above     Objective:    BP 128/80 (BP Location: Left Arm, Patient Position: Sitting, Cuff Size: Large)   Pulse 93   Temp 99.1 F (37.3 C) (Oral)   Resp 16   Ht 6\' 4"  (1.93 m)   Wt (!) 356 lb 2 oz (161.5 kg)   SpO2 94%   BMI 43.35 kg/m   Wt Readings from Last 3 Encounters:  02/01/16 (!) 356 lb 2 oz (161.5 kg)  01/06/16 (!) 348 lb (157.9 kg)  12/11/15 (!) 350 lb 3.2 oz (158.8 kg)    Physical Exam  Constitutional: He appears well-developed and well-nourished. No distress.  Morbidly obese; weight gain of over 8 pounds in the last 4 weeks  HENT:  Head: Normocephalic and atraumatic.  Eyes: EOM are normal. No scleral icterus.  Neck: No thyromegaly present.  Cardiovascular: Normal rate and regular rhythm.   Pulmonary/Chest: Effort normal and breath sounds normal.  Abdominal: Soft. Bowel sounds are normal. He exhibits no distension.  Musculoskeletal: He exhibits no edema.  Neurological: Coordination normal.  Skin: Skin is warm and dry. No pallor.  Skin tags around neck  Psychiatric: He has a normal mood and affect. His behavior is normal. Judgment and thought content normal.      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 tophus    Check uric acid; avoid [place], gravies, organ meats, chicken soup; try tart cherry      Relevant Orders   Uric acid (Completed)     Other   Pre-diabetes (Chronic)      Check a1c today, work on weight loss      Relevant Orders   Hemoglobin A1c (Completed)   Morbid obesity (HCC) (Chronic)    Encouraged patient to keep trying to lose weight; I fully support him in his desire to pursue bariatric surgery      Hyperlipidemia LDL goal <100 (Chronic)    Check lipids      Relevant Orders   Lipid panel (Completed)   Fatigue    Check vit D      Relevant Orders   COMPLETE METABOLIC PANEL WITH GFR (Completed)   VITAMIN D 25 Hydroxy (Vit-D Deficiency, Fractures) (Completed)   CBC with Differential/Platelet (Completed)   Elevated TSH - Primary    Check TSH      Relevant Orders   TSH (Completed)    Other Visit Diagnoses    Needs flu shot       Relevant Orders   Flu Vaccine QUAD 36+ mos PF IM (Fluarix & Fluzone Quad PF) (Completed)       Follow up plan: Return in about 3 months (around 05/03/2016).  An after-visit summary was printed and given to the patient at check-out.  Please see the patient instructions which may contain other information and recommendations beyond what is mentioned above in the assessment and plan.  Meds ordered this encounter  Medications  . chlorhexidine (PERIDEX) 0.12 % solution    Refill:  0  . DISCONTD: chlorhexidine (PERIDEX) 0.12 % solution    Orders Placed This Encounter  Procedures  . Flu Vaccine QUAD 36+ mos PF IM (Fluarix & Fluzone Quad PF)  . COMPLETE METABOLIC PANEL WITH GFR  . Lipid panel  . Hemoglobin A1c  . Uric acid  . VITAMIN D 25 Hydroxy (Vit-D Deficiency, Fractures)  . CBC with Differential/Platelet  . TSH

## 2016-02-01 NOTE — Assessment & Plan Note (Signed)
Check uric acid; avoid Mal[place]key, gravies, organ meats, chicken soup; try tart cherry

## 2016-02-01 NOTE — Patient Instructions (Addendum)
You received the flu shot today; it should protect you against the flu virus over the coming months; it will take about two weeks for antibodies to develop; do try to stay away from hospitals, nursing homes, and daycares during peak flu season; taking extra vitamin C daily during flu season may help you avoid getting sick Try tart cherry We'll get labs today Bring by any FMLA paperwork you need me to do Check out the information at familydoctor.org entitled "Nutrition for Weight Loss: What You Need to Know about Fad Diets" Try to lose between 1-2 pounds per week by taking in fewer calories and burning off more calories You can succeed by limiting portions, limiting foods dense in calories and fat, becoming more active, and drinking 8 glasses of water a day (64 ounces) Don't skip meals, especially breakfast, as skipping meals may alter your metabolism Do not use over-the-counter weight loss pills or gimmicks that claim rapid weight loss A healthy BMI (or body mass index) is between 18.5 and 24.9 You can calculate your ideal BMI at the NIH website JobE[employer].huhttp://www.nhlbi.nih.gov/health/educational/lose_wt/BMI/bmicalc.htm Try to limit saturated fats in your diet (bologna, hot dogs, barbeque, cheeseburgers, hamburgers, steak, bacon, sausage, cheese, etc.) and get more fresh fruits, vegetables, and whole grains

## 2016-02-01 NOTE — Assessment & Plan Note (Signed)
Check a1c today, work on weight loss

## 2016-02-01 NOTE — Assessment & Plan Note (Signed)
Check lipids 

## 2016-02-01 NOTE — Assessment & Plan Note (Signed)
Check TSH 

## 2016-02-01 NOTE — Assessment & Plan Note (Signed)
Continue CPAP.  

## 2016-02-02 LAB — LIPID PANEL
CHOLESTEROL: 217 mg/dL — AB (ref 125–200)
HDL: 48 mg/dL (ref >=40)
LDL Cholesterol: 105 mg/dL (ref <130)
Total CHOL/HDL Ratio: 4.5 Ratio (ref <=5.0)
Triglycerides: 318 mg/dL — ABNORMAL HIGH (ref <150)
VLDL: 64 mg/dL — AB (ref <30)

## 2016-02-02 LAB — COMPLETE METABOLIC PANEL WITH GFR
ALBUMIN: 4.5 g/dL (ref 3.6–5.1)
ALK PHOS: 77 U/L (ref 40–115)
ALT: 27 U/L (ref 9–46)
AST: 17 U/L (ref 10–40)
BILIRUBIN TOTAL: 0.3 mg/dL (ref 0.2–1.2)
BUN: 15 mg/dL (ref 7–25)
CALCIUM: 10.1 mg/dL (ref 8.6–10.3)
CHLORIDE: 102 mmol/L (ref 98–110)
CO2: 25 mmol/L (ref 20–31)
CREATININE: 1.32 mg/dL (ref 0.60–1.35)
GFR, EST AFRICAN AMERICAN: 81 mL/min (ref >=60)
GFR, Est Non African American: 70 mL/min (ref >=60)
Glucose, Bld: 92 mg/dL (ref 65–99)
Potassium: 4.2 mmol/L (ref 3.5–5.3)
Sodium: 143 mmol/L (ref 135–146)
TOTAL PROTEIN: 7.7 g/dL (ref 6.1–8.1)

## 2016-02-02 LAB — HEMOGLOBIN A1C
Hgb A1c MFr Bld: 6 % — ABNORMAL HIGH (ref <5.7)
MEAN PLASMA GLUCOSE: 126 mg/dL

## 2016-02-02 LAB — URIC ACID: URIC ACID, SERUM: 9.4 mg/dL — AB (ref 4.0–8.0)

## 2016-02-02 LAB — VITAMIN D 25 HYDROXY (VIT D DEFICIENCY, FRACTURES): VIT D 25 HYDROXY: 19 ng/mL — AB (ref 30–100)

## 2016-02-05 NOTE — Assessment & Plan Note (Signed)
Encouraged patient to keep trying to lose weight; I fully support him in his desire to pursue bariatric surgery

## 2016-02-09 ENCOUNTER — Encounter: Payer: Self-pay | Admitting: Dietician

## 2016-02-09 ENCOUNTER — Encounter: Payer: 59 | Attending: General Surgery | Admitting: Dietician

## 2016-02-09 DIAGNOSIS — Z713 Dietary counseling and surveillance: Secondary | ICD-10-CM | POA: Insufficient documentation

## 2016-02-09 NOTE — Progress Notes (Signed)
Supervised weight loss:  Appt start time: 1000 end time:  1015  SWL visit 6: (Patient began SWL visit in May 2017).  Primary concerns today: Donald [name] returns having gained 4 pounds since last month. Feeling mentally ready for surgery. Still going to the gym 2-3x a week. Doing more swimming than weight lifting lately. Does not have any questions about the diet after surgery. Provided information about the next steps.   Weight: 352 lbs BMI: 42.9  MEDICATIONS: see list  DIETARY INTAKE:  24-hr recall:  B ( AM): sometimes skips, meat and cheese Snk ( AM): deli meat and cheese  L ( PM): Core protein shake Snk ( PM):  D ( PM): BBQ chicken with salad  Snk ( PM): another protein shake  Beverages: water, low sugar nut milk with powder protein shake  Physical activity: swimming, lifting, elliptical 3 days a week for 45 minutes to a hour.  Estimated energy needs: 2000-2200 calories  Progress Towards Goal(s):  In progress.   Nutritional Diagnosis:  [place]-3.3 Overweight/obesity related to past poor dietary habits and physical inactivity as evidenced by patient in SWL for pending surgery following dietary guidelines for continued weight loss.     Intervention:  Nutrition counseling provided.  Goals: -Continue to have a protein food with each meal and snack -Find a new approved protein shake  Handouts given during visit include:  none  Monitoring/Evaluation:  Dietary intake, exercise, and body weight in 4 week(s).

## 2016-02-09 NOTE — Patient Instructions (Signed)
-  Continue to have a protein food with each meal and snack -Find a new protein shake that you like  -Avoid having shake every day so you don't get tired of it 

## 2016-02-10 ENCOUNTER — Other Ambulatory Visit: Payer: Self-pay

## 2016-02-10 DIAGNOSIS — M79672 Pain in left foot: Secondary | ICD-10-CM

## 2016-02-10 DIAGNOSIS — M25532 Pain in left wrist: Secondary | ICD-10-CM

## 2016-02-10 DIAGNOSIS — G4733 Obstructive sleep apnea (adult) (pediatric): Secondary | ICD-10-CM

## 2016-02-10 DIAGNOSIS — M1A09X0 Idiopathic chronic gout, multiple sites, without tophus (tophi): Secondary | ICD-10-CM

## 2016-02-10 DIAGNOSIS — M10032 Idiopathic gout, left wrist: Secondary | ICD-10-CM

## 2016-02-10 NOTE — Telephone Encounter (Signed)
Please review labs and order refills! Patient is upset states he got labs last week and has not  Heard anything and needs refills?

## 2016-02-11 ENCOUNTER — Other Ambulatory Visit: Payer: Self-pay | Admitting: Family Medicine

## 2016-02-11 DIAGNOSIS — M1A09X0 Idiopathic chronic gout, multiple sites, without tophus (tophi): Secondary | ICD-10-CM

## 2016-02-11 DIAGNOSIS — G4733 Obstructive sleep apnea (adult) (pediatric): Secondary | ICD-10-CM

## 2016-02-11 MED ORDER — VITAMIN D (ERGOCALCIFEROL) 1.25 MG (50000 UNIT) PO CAPS: 50000.0000 [IU] | ORAL_CAPSULE | ORAL | 1 refills | Status: AC

## 2016-02-11 MED ORDER — ALLOPURINOL 300 MG PO TABS: 300.0000 mg | ORAL_TABLET | Freq: Every day | ORAL | 0 refills | Status: DC

## 2016-02-11 NOTE — Telephone Encounter (Signed)
Pt  notified. Lab work order and will be up front.

## 2016-02-11 NOTE — Telephone Encounter (Signed)
My apologies to the patient Please let him know that his uric acid is high and I'd like to increase the allopurinol from 200 mg daily to 300 mg daily; I sent a new Rx in; please ask him to have BMP and uric acid rechecked TWO weeks after the dose change (please enter orders) I promise to contact him the very next day with his lab results His RBCs are high, so I'm concerned about sleep apnea; I don't think the CPAP is doing what it needs to be doing if he's still compensating by making more RBCs; please enter referral back to whomever manages his sleep apnea to get that adjusted and evaluated His thyroid normal His vitamin D is low enough that I'd like to start Rx vitamin D once a week for 8 weeks, then have him take 1000 iu of OTC vitamin D3 once a day His 3 month blood sugar average is much better TG are high; work on healthier eating, weight loss, and take krill oil or fish oil BID Thank you

## 2016-02-19 NOTE — Progress Notes (Signed)
Need orders for 12-4 surgery in epic

## 2016-02-21 ENCOUNTER — Ambulatory Visit: Payer: Self-pay | Admitting: General Surgery

## 2016-03-02 ENCOUNTER — Ambulatory Visit: Payer: Self-pay | Admitting: General Surgery

## 2016-03-02 NOTE — H&P (Signed)
Donald [name] 03/01/2016 2:45 PM Location: [facility] Patient #: [id number] DOB: 09/28/1980 Married / Language: English / Race: Black or African American Male  History of Present Illness (Donald [name] M. Donald Creque MD; 03/02/2016 8:30 AM) The patient is a 35 year old male who presents for a pre-op visit. He comes in today for his preoperative visit. I initially met him in June. His weight at that time was 358 pounds. He denies any significant medical changes since I initially met him. He has been treated for vitamin D deficiency. He denies any chest pain, chest pressure, source of breath, orthopnea, paroxysmal nocturnal dyspnea, reflux, jaw pain, nausea, melena or hematochezia.  His upper GI, chest x-ray and EKG were unremarkable. His bariatric evaluation labs in October were unremarkable. His lipid panel was slightly abnormal. Total cholesterol 217, triglyceride level 318, HDL level 48, LDL level 105. Hemoglobin A1c 6. Vitamin D level was 19  He continues to use his CPAP.   Problem List/Past Medical (Donald [name] M Donald Cheramie, MD; 03/02/2016 8:32 AM) PREDIABETES (R73.03) MORBID OBESITY WITH BMI OF 40.0-44.9, ADULT (E66.01) VITAMIN D DEFICIENCY (E55.9)  Other Problems (Donald [name] M Donald Lukes, MD; 03/02/2016 8:32 AM) Hypercholesterolemia BLOOD PRESSURE ELEVATED WITHOUT HISTORY OF HTN (R03.0) OBSTRUCTIVE SLEEP APNEA ON CPAP (G47.33) Hemorrhoids Back Pain  Past Surgical History (Donald [name] M Donald Freeburg, MD; 03/02/2016 8:32 AM) Oral Surgery  Diagnostic Studies History (Donald [name] M Donald Loewe, MD; 03/02/2016 8:32 AM) Colonoscopy never  Allergies (Donald [name], CMA; 03/01/2016 2:49 PM) No Known Drug Allergies 10/07/2015  Medication History (Donald [name] M Donald Sledd, MD; 03/02/2016 8:32 AM) Allopurinol (300MG Tablet, Daily Oral) Active. Peridex (0.12% Solution, Mouth/Throat daily) Active. Ibuprofen (800MG Tablet, Oral as needed) Active. Indocin (50MG Capsule, Oral daily) Active. Vitamin D (50000UNIT  Capsule, Oral daily) Active. Medications Reconciled OxyCODONE HCl (5MG/5ML Solution, 5-10 Milliliter Oral every four hours, as needed, Taken starting 03/01/2016) Active. Protonix (40MG Tablet DR, 1 (one) Tablet Oral daily, Taken starting 03/01/2016) Active. Zofran ODT (4MG Tablet Disint, 1 (one) Tablet Disperse Oral every six hours, as needed, Taken starting 03/01/2016) Active. Indomethacin (50MG Capsule, Oral) Active. DiazePAM (5MG Tablet, Oral) Active.  Social History (Donald [name] M Donald Hodgkins, MD; 03/02/2016 8:32 AM) No drug use Tobacco use Former smoker. No caffeine use Alcohol use Occasional alcohol use.  Family History (Donald [name] M Donald Wittmeyer, MD; 03/02/2016 8:32 AM) Arthritis Mother. Cerebrovascular Accident Family Members In General. Diabetes Mellitus Family Members In General, Father. Prostate Cancer Family Members In General, Father. Hypertension Father, Mother. Migraine Headache Father.     Review of Systems (Donald [name] M. Donald Ratajczak MD; 03/01/2016 7:20 PM) General Present- Weight Gain and Weight Loss. Not Present- Appetite Loss, Chills, Fatigue, Fever and Night Sweats. HEENT Present- Ringing in the Ears. Not Present- Earache, Hearing Loss, Hoarseness, Nose Bleed, Oral Ulcers, Seasonal Allergies, Sinus Pain, Sore Throat, Visual Disturbances, Wears glasses/contact lenses and Yellow Eyes. Respiratory Not Present- Bloody sputum, Chronic Cough, Difficulty Breathing, Snoring and Wheezing. Breast Not Present- Breast Mass, Breast Pain, Nipple Discharge and Skin Changes. Cardiovascular Not Present- Chest Pain, Difficulty Breathing Lying Down, Leg Cramps, Palpitations, Rapid Heart Rate, Shortness of Breath and Swelling of Extremities. Gastrointestinal Not Present- Abdominal Pain, Bloating, Bloody Stool, Change in Bowel Habits, Chronic diarrhea, Constipation, Difficulty Swallowing, Excessive gas, Gets full quickly at meals, Hemorrhoids, Indigestion, Nausea, Rectal Pain and Vomiting. Male  Genitourinary Not Present- Blood in Urine, Change in Urinary Stream, Frequency, Impotence, Nocturia, Painful Urination, Urgency and Urine Leakage. Musculoskeletal Present- Back Pain, Joint Stiffness and Swelling of Extremities. Not Present- Joint Pain, Muscle Pain and   Muscle Weakness. Neurological Not Present- Decreased Memory, Fainting, Headaches, Numbness, Seizures, Tingling, Tremor, Trouble walking and Weakness. Psychiatric Not Present- Anxiety, Bipolar, Change in Sleep Pattern, Depression, Fearful and Frequent crying. Endocrine Not Present- Cold Intolerance, Excessive Hunger, Hair Changes, Heat Intolerance and New Diabetes. Hematology Not Present- Easy Bruising, Excessive bleeding, Gland problems, HIV and Persistent Infections.  Vitals (Donald [name] CMA; 03/01/2016 2:53 PM) 03/01/2016 2:53 PM Weight: 362.8 lb Height: 77.5in Body Surface Area: 2.89 m Body Mass Index: 42.47 kg/m  Temp.: 98.8F  Pulse: 90 (Regular)  BP: 132/82 (Sitting, Left Arm, Standard)      Physical Exam (Donald [name] M. [name] MD; 03/01/2016 7:20 PM)  General Mental Status-Alert. General Appearance-Consistent with stated age. Hydration-Well hydrated. Voice-Normal. Note: Morbidly obese, mainly evenly distributed  Head and Neck Head-normocephalic, atraumatic with no lesions or palpable masses. Trachea-midline. Thyroid Gland Characteristics - normal size and consistency.  Eye Eyeball - Bilateral-Extraocular movements intact. Sclera/Conjunctiva - Bilateral-No scleral icterus.  Chest and Lung Exam Chest and lung exam reveals -quiet, even and easy respiratory effort with no use of accessory muscles and on auscultation, normal breath sounds, no adventitious sounds and normal vocal resonance. Inspection Chest Wall - Normal. Back - normal.  Breast - Did not examine.  Cardiovascular Cardiovascular examination reveals -normal heart sounds, regular rate and rhythm with no murmurs and  normal pedal pulses bilaterally.  Abdomen Inspection Inspection of the abdomen reveals - No Hernias. Skin - Scar - no surgical scars. Palpation/Percussion Palpation and Percussion of the abdomen reveal - Soft, Non Tender, No Rebound tenderness, No Rigidity (guarding) and No hepatosplenomegaly. Auscultation Auscultation of the abdomen reveals - Bowel sounds normal.  Peripheral Vascular Upper Extremity Palpation - Pulses bilaterally normal.  Neurologic Neurologic evaluation reveals -alert and oriented x 3 with no impairment of recent or remote memory. Mental Status-Normal.  Neuropsychiatric The patient's mood and affect are described as -normal. Judgment and Insight-insight is appropriate concerning matters relevant to self.  Musculoskeletal Normal Exam - Left-Upper Extremity Strength Normal and Lower Extremity Strength Normal. Normal Exam - Right-Upper Extremity Strength Normal and Lower Extremity Strength Normal. Note: Bilateral knee crepitus  Lymphatic Head & Neck  General Head & Neck Lymphatics: Bilateral - Description - Normal. Axillary - Did not examine. Femoral & Inguinal - Did not examine.    Assessment & Plan (Arvle Grabe M. Manika Hast MD; 03/02/2016 8:32 AM)  MORBID OBESITY WITH BMI OF 40.0-44.9, ADULT (E66.01) Impression: We reviewed his preoperative workup. We discussed results of his upper GI and chest x-ray and EKG. We discussed results of his blood work. He took high-dose vitamin D supplementation. He is accompanied by his wife today. We discussed the typical hospital course as well as the recovery process. We discussed the typical issues that occurred during hospitalization like nausea, epigastric cramps and gas pain. We discussed the typical return to work schedule. We discussed the importance of the preoperative meal plan. All of their questions were asked and answered. He was given his postoperative prescriptions for pain, heartburn and nausea  today.  Current Plans Started OxyCODONE HCl 5MG/5ML, 5-10 Milliliter every four hours, as needed, 200 Milliliter, 03/01/2016, No Refill. Started Protonix 40MG, 1 (one) Tablet daily, #30, 30 days starting 03/01/2016, Ref. x3. Started Zofran ODT 4MG, 1 (one) Tablet Disperse every six hours, as needed, #20, 03/01/2016, No Refill. Pt Education - EMW_preopbariatric PREDIABETES (R73.03)  BLOOD PRESSURE ELEVATED WITHOUT HISTORY OF HTN (R03.0)  OBSTRUCTIVE SLEEP APNEA ON CPAP (G47.33) Impression: cont cpap  VITAMIN D DEFICIENCY (E55.9) Impression: Continued 2000   and 3000 international units of vitamin D per day  Analea Muller M. Robynn Marcel, MD, FACS General, Bariatric, & Minimally Invasive Surgery [facility]   

## 2016-03-04 ENCOUNTER — Ambulatory Visit: Payer: Self-pay | Admitting: Dietician

## 2016-03-07 ENCOUNTER — Encounter: Payer: 59 | Attending: General Surgery | Admitting: Dietician

## 2016-03-07 DIAGNOSIS — Z713 Dietary counseling and surveillance: Secondary | ICD-10-CM | POA: Insufficient documentation

## 2016-03-07 NOTE — Progress Notes (Signed)
  Pre-Operative Nutrition Class:  Appt start time: 7903   End time:  1830.  Patient was seen on 03/07/2016 for Pre-Operative Bariatric Surgery Education [facility].   Surgery date: 03/21/2016 Surgery type: sleeve gastrectomy [facility] weight at [facility]: 357 lbs on 10/08/2015 Weight today: 366 lbs  TANITA  BODY COMP RESULTS  03/07/16   BMI (kg/m^2) 43.4   Fat Mass (lbs) 160.6   Fat Free Mass (lbs) 205.4   Total Body Water (lbs) 158.4   Samples given per MNT protocol. Patient educated on appropriate usage: Bariatric Advantage multivitamin (mixed fruit - qty 1) Lot #: [id number] Exp: 02/2017  Premier protein shake (strawberry - qty 1) Lot #: [id number] Exp: 01/2017  Bariatric Advantage Vitamins Calcium Citrate chew (lemon - qty 1) Lot #: [id number] Exp: 09/2016  The following the learning objectives were met by the patient during this course:  Identify Pre-Op Dietary Goals and will begin 2 weeks pre-operatively  Identify appropriate sources of fluids and proteins   State protein recommendations and appropriate sources pre and post-operatively  Identify Post-Operative Dietary Goals and will follow for 2 weeks post-operatively  Identify appropriate multivitamin and calcium sources  Describe the need for physical activity post-operatively and will follow MD recommendations  State when to call healthcare provider regarding medication questions or post-operative complications  Handouts given during class include:  Pre-Op Bariatric Surgery Diet Handout  Protein Shake Handout  Post-Op Bariatric Surgery Nutrition Handout  BELT Program Information Flyer  Support Group Information Flyer  [facility] Bariatric Supplements Price List  Follow-Up Plan: Patient will follow-up at [facility] 2 weeks post operatively for diet advancement per MD.

## 2016-03-08 ENCOUNTER — Encounter: Payer: Self-pay | Admitting: Dietician

## 2016-03-09 NOTE — Progress Notes (Signed)
Chest, ekg 7/17 epic

## 2016-03-09 NOTE — Patient Instructions (Addendum)
Donald BergerHayward Schecter [name]  03/09/2016   Your procedure is scheduled on: 03/21/16  [facility]  [facility] to 3rd floor to  Short Stay Center at  1145 AM.  Call this number if you have problems the morning of surgery [phone]   Remember: ONLY 1 PERSON MAY GO WITH YOU TO SHORT STAY TO GET  READY MORNING OF YOUR SURGERY.              PLEASE BRING CPAP MASK AND TUBING WITH YOU TO HOSPITAL!    Do not eat food:After Midnight. May have clear liquids until 0745 am, then nothing   CLEAR LIQUID DIET   Foods Allowed                                                                     Foods Excluded  Coffee and tea, regular and decaf                             liquids that you cannot  Plain Jell-O in any flavor                                             see through such as: Fruit ices (not with fruit pulp)                                     milk, soups, orange juice  Iced Popsicles                                    All solid food Carbonated beverages, regular and diet                                    Cranberry, grape and apple juices Sports drinks like Gatorade Lightly seasoned clear broth or consume(fat free) Sugar, honey syrup  Sample Menu Breakfast                                Lunch                                     Supper Cranberry juice                    Beef broth                            Chicken broth Jell-O  Grape juice                           Apple juice Coffee or tea                        Jell-O                                      Popsicle                                                Coffee or tea                        Coffee or tea  _____________________________________________________________________       Take these medicines the morning of surgery with A SIP OF WATER: none                                 You may not have any metal on your body including hair  pins and              piercings  Do not wear jewelry, make-up, lotions, powders or perfumes, deodorant             Do not wear nail polish.  Do not shave  48 hours prior to surgery.              Men may shave face and neck.   Do not bring valuables to the hospital. [facility] IS NOT             RESPONSIBLE   FOR VALUABLES.  Contacts, dentures or bridgework may not be worn into surgery.  Leave suitcase in the car. After surgery it may be brought to your room.                Please read over the following fact sheets you were given: _____________________________________________________________________             [facility] - Preparing for Surgery Before surgery, you can play an important role.  Because skin is not sterile, your skin needs to be as free of germs as possible.  You can reduce the number of germs on your skin by washing with CHG (chlorahexidine gluconate) soap before surgery.  CHG is an antiseptic cleaner which kills germs and bonds with the skin to continue killing germs even after washing. Please DO NOT use if you have an allergy to CHG or antibacterial soaps.  If your skin becomes reddened/irritated stop using the CHG and inform your nurse when you arrive at Short Stay. Do not shave (including legs and underarms) for at least 48 hours prior to the first CHG shower.  You may shave your face/neck. Please follow these instructions carefully:  1.  Shower with CHG Soap the night before surgery and the  morning of Surgery.  2.  If you choose to wash your hair, wash your hair first as usual with your  normal  shampoo.  3.  After you shampoo, rinse your hair and body thoroughly to remove the  shampoo.  4.  Use CHG as you would any other liquid soap.  You can apply chg directly  to the skin and wash                       Gently with a scrungie or clean washcloth.  5.  Apply the CHG Soap to your body ONLY FROM THE NECK DOWN.   Do not use on face/ open                            Wound or open sores. Avoid contact with eyes, ears mouth and genitals (private parts).                       Wash face,  Genitals (private parts) with your normal soap.             6.  Wash thoroughly, paying special attention to the area where your surgery  will be performed.  7.  Thoroughly rinse your body with warm water from the neck down.  8.  DO NOT shower/wash with your normal soap after using and rinsing off  the CHG Soap.                9.  Pat yourself dry with a clean towel.            10.  Wear clean pajamas.            11.  Place clean sheets on your bed the night of your first shower and do not  sleep with pets. Day of Surgery : Do not apply any lotions/deodorants the morning of surgery.  Please wear clean clothes to the hospital/surgery center.  FAILURE TO FOLLOW THESE INSTRUCTIONS MAY RESULT IN THE CANCELLATION OF YOUR SURGERY PATIENT SIGNATURE_________________________________  NURSE SIGNATURE__________________________________  ________________________________________________________________________

## 2016-03-14 ENCOUNTER — Encounter (HOSPITAL_COMMUNITY): Payer: Self-pay

## 2016-03-14 ENCOUNTER — Encounter (HOSPITAL_COMMUNITY)
Admission: RE | Admit: 2016-03-14 | Discharge: 2016-03-14 | Disposition: A | Discharge status: Home / Self Care | Payer: 59 | Source: Ambulatory Visit | Attending: General Surgery | Admitting: General Surgery

## 2016-03-14 DIAGNOSIS — Z01812 Encounter for preprocedural laboratory examination: Secondary | ICD-10-CM | POA: Diagnosis present

## 2016-03-14 HISTORY — DX: Prediabetes: R73.03

## 2016-03-14 LAB — COMPREHENSIVE METABOLIC PANEL
ALK PHOS: 64 U/L (ref 38–126)
ALT: 36 U/L (ref 17–63)
AST: 28 U/L (ref 15–41)
Albumin: 4.7 g/dL (ref 3.5–5.0)
Anion gap: 8 (ref 5–15)
BILIRUBIN TOTAL: 0.6 mg/dL (ref 0.3–1.2)
BUN: 20 mg/dL (ref 6–20)
CALCIUM: 9.6 mg/dL (ref 8.9–10.3)
CO2: 26 mmol/L (ref 22–32)
CREATININE: 1.13 mg/dL (ref 0.61–1.24)
Chloride: 103 mmol/L (ref 101–111)
Glucose, Bld: 88 mg/dL (ref 65–99)
Potassium: 4.5 mmol/L (ref 3.5–5.1)
Sodium: 137 mmol/L (ref 135–145)
Total Protein: 8.4 g/dL — ABNORMAL HIGH (ref 6.5–8.1)

## 2016-03-14 LAB — CBC WITH DIFFERENTIAL/PLATELET
Basophils Absolute: 0 10*3/uL (ref 0.0–0.1)
Basophils Relative: 0 %
EOS PCT: 1 %
Eosinophils Absolute: 0.1 10*3/uL (ref 0.0–0.7)
HEMATOCRIT: 48.1 % (ref 39.0–52.0)
HEMOGLOBIN: 15.8 g/dL (ref 13.0–17.0)
LYMPHS ABS: 1.8 10*3/uL (ref 0.7–4.0)
LYMPHS PCT: 30 %
MCH: 25.9 pg — AB (ref 26.0–34.0)
MCHC: 32.8 g/dL (ref 30.0–36.0)
MCV: 78.7 fL (ref 78.0–100.0)
Monocytes Absolute: 0.4 10*3/uL (ref 0.1–1.0)
Monocytes Relative: 7 %
Neutro Abs: 3.8 10*3/uL (ref 1.7–7.7)
Neutrophils Relative %: 62 %
Platelets: 218 10*3/uL (ref 150–400)
RBC: 6.11 MIL/uL — AB (ref 4.22–5.81)
RDW: 14.6 % (ref 11.5–15.5)
WBC: 6.1 10*3/uL (ref 4.0–10.5)

## 2016-03-15 ENCOUNTER — Other Ambulatory Visit: Payer: Self-pay

## 2016-03-15 DIAGNOSIS — M10032 Idiopathic gout, left wrist: Secondary | ICD-10-CM

## 2016-03-15 DIAGNOSIS — M25532 Pain in left wrist: Secondary | ICD-10-CM

## 2016-03-15 DIAGNOSIS — M79672 Pain in left foot: Secondary | ICD-10-CM

## 2016-03-15 LAB — HEMOGLOBIN A1C
Hgb A1c MFr Bld: 6.3 % — ABNORMAL HIGH (ref 4.8–5.6)
MEAN PLASMA GLUCOSE: 134 mg/dL

## 2016-03-15 MED ORDER — ALLOPURINOL 300 MG PO TABS: 300.0000 mg | ORAL_TABLET | Freq: Every day | ORAL | 0 refills | Status: DC

## 2016-03-15 MED ORDER — IBUPROFEN 800 MG PO TABS: 800.0000 mg | ORAL_TABLET | Freq: Three times a day (TID) | ORAL | 1 refills | Status: DC | PRN

## 2016-03-15 NOTE — Telephone Encounter (Signed)
Please see 02/10/16 refill note We increased his allopurinol and he was supposed to have had a uric acid and BMP rechecked 2 weeks later (someone ordered a CMP, but I just needed a BMP) I really need to see his uric acid level; that's the hold-up I see that someone else did a creatinine yesterday at the hospital, so now I don't need the BMP, but I need his uric acid level I'm not trying to be difficult, but I need to know if the dose is correct Now that I see his kidney function on the higher dose of allopurinol, I can refill the ibuprofen

## 2016-03-15 NOTE — Telephone Encounter (Signed)
Called patient informed him of your message and he was still upset.  He states he just got blood work yesterday and he can't get off work this week to get bloodwork done again and he is having surgery next week.  I told him he could even go to the hospital for after hours to get it done.  He states does not understand why you cannot refill the older dose since he cannot not get blood work done since he has been on this med for years.  I told him I would let you call him and explain to him in detail why you need labs, he denied and states "just forget about it"

## 2016-03-15 NOTE — Telephone Encounter (Signed)
Patient is frustrated was just here in October and asked for ibuprofen refill which was not given and allopurinol which had no refills, he states usually gets refills for this for 1 year.  Please advise?

## 2016-03-15 NOTE — Telephone Encounter (Signed)
Please just see if they can add on his uric acid to blood in lab I can refill the allopurinol now that I see it hasn't harmed his kidneys, but I just don't know if it's the right dose I'll send in the 300 mg strength but I'm sorry he's upset

## 2016-03-21 ENCOUNTER — Inpatient Hospital Stay (HOSPITAL_COMMUNITY)
Admission: RE | Admit: 2016-03-21 | Discharge: 2016-03-22 | DRG: 621 | Disposition: A | Discharge status: Home / Self Care | Payer: 59 | Source: Ambulatory Visit | Attending: General Surgery | Admitting: General Surgery

## 2016-03-21 ENCOUNTER — Inpatient Hospital Stay (HOSPITAL_COMMUNITY): Payer: 59 | Admitting: Anesthesiology

## 2016-03-21 ENCOUNTER — Encounter (HOSPITAL_COMMUNITY): Payer: Self-pay | Admitting: *Deleted

## 2016-03-21 ENCOUNTER — Encounter (HOSPITAL_COMMUNITY)
Admission: RE | Disposition: A | Discharge status: Home / Self Care | Payer: Self-pay | Source: Ambulatory Visit | Attending: General Surgery

## 2016-03-21 DIAGNOSIS — Z6841 Body Mass Index (BMI) 40.0 and over, adult: Secondary | ICD-10-CM | POA: Diagnosis not present

## 2016-03-21 DIAGNOSIS — Z6830 Body mass index (BMI) 30.0-30.9, adult: Secondary | ICD-10-CM | POA: Diagnosis present

## 2016-03-21 DIAGNOSIS — E559 Vitamin D deficiency, unspecified: Secondary | ICD-10-CM | POA: Diagnosis present

## 2016-03-21 DIAGNOSIS — E7800 Pure hypercholesterolemia, unspecified: Secondary | ICD-10-CM | POA: Diagnosis present

## 2016-03-21 DIAGNOSIS — M1A09X0 Idiopathic chronic gout, multiple sites, without tophus (tophi): Secondary | ICD-10-CM | POA: Diagnosis present

## 2016-03-21 DIAGNOSIS — E781 Pure hyperglyceridemia: Secondary | ICD-10-CM | POA: Diagnosis present

## 2016-03-21 DIAGNOSIS — Z79899 Other long term (current) drug therapy: Secondary | ICD-10-CM

## 2016-03-21 DIAGNOSIS — R7303 Prediabetes: Secondary | ICD-10-CM | POA: Diagnosis present

## 2016-03-21 DIAGNOSIS — G4733 Obstructive sleep apnea (adult) (pediatric): Secondary | ICD-10-CM | POA: Diagnosis present

## 2016-03-21 DIAGNOSIS — R030 Elevated blood-pressure reading, without diagnosis of hypertension: Secondary | ICD-10-CM | POA: Diagnosis present

## 2016-03-21 DIAGNOSIS — Z9884 Bariatric surgery status: Secondary | ICD-10-CM

## 2016-03-21 DIAGNOSIS — Z903 Acquired absence of stomach [part of]: Secondary | ICD-10-CM

## 2016-03-21 DIAGNOSIS — E669 Obesity, unspecified: Secondary | ICD-10-CM | POA: Diagnosis present

## 2016-03-21 HISTORY — DX: Acquired absence of stomach (part of): Z90.3

## 2016-03-21 HISTORY — PX: LAPAROSCOPIC GASTRIC SLEEVE RESECTION: SHX5895

## 2016-03-21 LAB — HEMOGLOBIN AND HEMATOCRIT, BLOOD
HEMATOCRIT: 46.5 % (ref 39.0–52.0)
HEMOGLOBIN: 15.5 g/dL (ref 13.0–17.0)

## 2016-03-21 LAB — GLUCOSE, CAPILLARY: Glucose-Capillary: 159 mg/dL — ABNORMAL HIGH (ref 65–99)

## 2016-03-21 SURGERY — GASTRECTOMY, SLEEVE, LAPAROSCOPIC
Anesthesia: General | Site: Abdomen

## 2016-03-21 MED ORDER — SCOPOLAMINE 1 MG/3DAYS TD PT72
MEDICATED_PATCH | TRANSDERMAL | Status: DC | PRN
  Administered 2016-03-21: 1 via TRANSDERMAL

## 2016-03-21 MED ORDER — ROCURONIUM BROMIDE 50 MG/5ML IV SOSY
PREFILLED_SYRINGE | INTRAVENOUS | Status: AC
  Filled 2016-03-21: qty 5

## 2016-03-21 MED ORDER — LACTATED RINGERS IV SOLN
INTRAVENOUS | Status: DC
  Administered 2016-03-21: 1000 mL via INTRAVENOUS

## 2016-03-21 MED ORDER — APREPITANT 80 MG PO CAPS
80.0000 mg | ORAL_CAPSULE | ORAL | Status: AC
  Administered 2016-03-21: 80 mg via ORAL
  Filled 2016-03-21: qty 1

## 2016-03-21 MED ORDER — CHLORHEXIDINE GLUCONATE CLOTH 2 % EX PADS: 6.0000 | MEDICATED_PAD | Freq: Once | CUTANEOUS | Status: DC

## 2016-03-21 MED ORDER — PHENYLEPHRINE 40 MCG/ML (10ML) SYRINGE FOR IV PUSH (FOR BLOOD PRESSURE SUPPORT)
PREFILLED_SYRINGE | INTRAVENOUS | Status: AC
  Filled 2016-03-21: qty 10

## 2016-03-21 MED ORDER — DIPHENHYDRAMINE HCL 50 MG/ML IJ SOLN: 12.5000 mg | Freq: Three times a day (TID) | INTRAMUSCULAR | Status: DC | PRN

## 2016-03-21 MED ORDER — FENTANYL CITRATE (PF) 100 MCG/2ML IJ SOLN
INTRAMUSCULAR | Status: DC | PRN
  Administered 2016-03-21 (×3): 50 ug via INTRAVENOUS
  Administered 2016-03-21: 100 ug via INTRAVENOUS
  Administered 2016-03-21: 50 ug via INTRAVENOUS

## 2016-03-21 MED ORDER — HYDROMORPHONE HCL 1 MG/ML IJ SOLN
INTRAMUSCULAR | Status: AC
  Filled 2016-03-21: qty 1

## 2016-03-21 MED ORDER — HYDROMORPHONE HCL 1 MG/ML IJ SOLN
0.2500 mg | INTRAMUSCULAR | Status: DC | PRN
  Administered 2016-03-21 (×4): 0.5 mg via INTRAVENOUS

## 2016-03-21 MED ORDER — PROPOFOL 10 MG/ML IV BOLUS
INTRAVENOUS | Status: DC | PRN
  Administered 2016-03-21: 200 mg via INTRAVENOUS

## 2016-03-21 MED ORDER — ACETAMINOPHEN 160 MG/5ML PO SOLN: 650.0000 mg | ORAL | Status: DC | PRN

## 2016-03-21 MED ORDER — POTASSIUM CHLORIDE IN NACL 20-0.45 MEQ/L-% IV SOLN
INTRAVENOUS | Status: DC
  Administered 2016-03-21 – 2016-03-22 (×3): via INTRAVENOUS
  Filled 2016-03-21 (×4): qty 1000

## 2016-03-21 MED ORDER — BUPIVACAINE LIPOSOME 1.3 % IJ SUSP
20.0000 mL | Freq: Once | INTRAMUSCULAR | Status: DC
  Filled 2016-03-21: qty 20

## 2016-03-21 MED ORDER — EPHEDRINE 5 MG/ML INJ
INTRAVENOUS | Status: AC
  Filled 2016-03-21: qty 10

## 2016-03-21 MED ORDER — SODIUM CHLORIDE 0.9 % IJ SOLN
INTRAMUSCULAR | Status: DC | PRN
  Administered 2016-03-21: 50 mL via INTRAVENOUS

## 2016-03-21 MED ORDER — ACETAMINOPHEN 160 MG/5ML PO SOLN
1000.0000 mg | Freq: Four times a day (QID) | ORAL | Status: DC
  Administered 2016-03-22 (×2): 1000 mg via ORAL
  Filled 2016-03-21 (×3): qty 40.6

## 2016-03-21 MED ORDER — CEFOTETAN DISODIUM-DEXTROSE 2-2.08 GM-% IV SOLR
2.0000 g | INTRAVENOUS | Status: AC
  Administered 2016-03-21: 2 g via INTRAVENOUS

## 2016-03-21 MED ORDER — SUGAMMADEX SODIUM 500 MG/5ML IV SOLN
INTRAVENOUS | Status: DC | PRN
Start: 2016-03-21 — End: 2016-03-21
  Administered 2016-03-21: 310 mg via INTRAVENOUS

## 2016-03-21 MED ORDER — MIDAZOLAM HCL 2 MG/2ML IJ SOLN
INTRAMUSCULAR | Status: AC
  Filled 2016-03-21: qty 2

## 2016-03-21 MED ORDER — PROPOFOL 10 MG/ML IV BOLUS
INTRAVENOUS | Status: AC
  Filled 2016-03-21: qty 40

## 2016-03-21 MED ORDER — PHENOL 1.4 % MT LIQD: 1.0000 | OROMUCOSAL | Status: DC | PRN

## 2016-03-21 MED ORDER — MORPHINE SULFATE (PF) 2 MG/ML IV SOLN
2.0000 mg | INTRAVENOUS | Status: DC | PRN
  Administered 2016-03-21 – 2016-03-22 (×5): 2 mg via INTRAVENOUS
  Filled 2016-03-21 (×5): qty 1

## 2016-03-21 MED ORDER — HYDROMORPHONE HCL 1 MG/ML IJ SOLN
INTRAMUSCULAR | Status: AC
  Filled 2016-03-21: qty 0.5

## 2016-03-21 MED ORDER — HEPARIN SODIUM (PORCINE) 5000 UNIT/ML IJ SOLN
5000.0000 [IU] | INTRAMUSCULAR | Status: AC
  Administered 2016-03-21: 5000 [IU] via SUBCUTANEOUS
  Filled 2016-03-21: qty 1

## 2016-03-21 MED ORDER — ONDANSETRON HCL 4 MG/2ML IJ SOLN
INTRAMUSCULAR | Status: AC
  Filled 2016-03-21: qty 2

## 2016-03-21 MED ORDER — LIDOCAINE 2% (20 MG/ML) 5 ML SYRINGE
INTRAMUSCULAR | Status: DC | PRN
  Administered 2016-03-21: 50 mg via INTRAVENOUS

## 2016-03-21 MED ORDER — ACETAMINOPHEN 160 MG/5ML PO SOLN: 325.0000 mg | ORAL | Status: DC | PRN

## 2016-03-21 MED ORDER — PHENYLEPHRINE HCL 10 MG/ML IJ SOLN
INTRAMUSCULAR | Status: DC | PRN
  Administered 2016-03-21: 40 ug via INTRAVENOUS
  Administered 2016-03-21: 80 ug via INTRAVENOUS
  Administered 2016-03-21: 40 ug via INTRAVENOUS

## 2016-03-21 MED ORDER — ENOXAPARIN SODIUM 30 MG/0.3ML ~~LOC~~ SOLN
30.0000 mg | Freq: Two times a day (BID) | SUBCUTANEOUS | Status: DC
  Administered 2016-03-22: 30 mg via SUBCUTANEOUS
  Filled 2016-03-21: qty 0.3

## 2016-03-21 MED ORDER — SUCCINYLCHOLINE CHLORIDE 200 MG/10ML IV SOSY
PREFILLED_SYRINGE | INTRAVENOUS | Status: AC
  Filled 2016-03-21: qty 10

## 2016-03-21 MED ORDER — DEXAMETHASONE SODIUM PHOSPHATE 10 MG/ML IJ SOLN
INTRAMUSCULAR | Status: AC
  Filled 2016-03-21: qty 1

## 2016-03-21 MED ORDER — PANTOPRAZOLE SODIUM 40 MG IV SOLR
40.0000 mg | Freq: Every day | INTRAVENOUS | Status: DC
  Administered 2016-03-21: 40 mg via INTRAVENOUS
  Filled 2016-03-21: qty 40

## 2016-03-21 MED ORDER — FENTANYL CITRATE (PF) 100 MCG/2ML IJ SOLN
INTRAMUSCULAR | Status: AC
  Filled 2016-03-21: qty 2

## 2016-03-21 MED ORDER — MIDAZOLAM HCL 5 MG/5ML IJ SOLN
INTRAMUSCULAR | Status: DC | PRN
  Administered 2016-03-21: 2 mg via INTRAVENOUS

## 2016-03-21 MED ORDER — SODIUM CHLORIDE 0.9 % IJ SOLN
INTRAMUSCULAR | Status: AC
  Filled 2016-03-21: qty 50

## 2016-03-21 MED ORDER — ONDANSETRON HCL 4 MG/2ML IJ SOLN
4.0000 mg | Freq: Four times a day (QID) | INTRAMUSCULAR | Status: DC
  Administered 2016-03-21 – 2016-03-22 (×3): 4 mg via INTRAVENOUS
  Filled 2016-03-21 (×3): qty 2

## 2016-03-21 MED ORDER — PROMETHAZINE HCL 25 MG/ML IJ SOLN: 6.2500 mg | INTRAMUSCULAR | Status: DC | PRN

## 2016-03-21 MED ORDER — LACTATED RINGERS IR SOLN
Status: DC | PRN
  Administered 2016-03-21: 3000 mL

## 2016-03-21 MED ORDER — CEFOTETAN DISODIUM-DEXTROSE 2-2.08 GM-% IV SOLR
INTRAVENOUS | Status: AC
  Filled 2016-03-21: qty 50

## 2016-03-21 MED ORDER — DEXAMETHASONE SODIUM PHOSPHATE 10 MG/ML IJ SOLN
INTRAMUSCULAR | Status: DC | PRN
  Administered 2016-03-21: 10 mg via INTRAVENOUS

## 2016-03-21 MED ORDER — 0.9 % SODIUM CHLORIDE (POUR BTL) OPTIME
TOPICAL | Status: DC | PRN
  Administered 2016-03-21: 1000 mL

## 2016-03-21 MED ORDER — SCOPOLAMINE 1 MG/3DAYS TD PT72
MEDICATED_PATCH | TRANSDERMAL | Status: AC
  Filled 2016-03-21: qty 1

## 2016-03-21 MED ORDER — BUPIVACAINE LIPOSOME 1.3 % IJ SUSP
INTRAMUSCULAR | Status: DC | PRN
  Administered 2016-03-21: 20 mL

## 2016-03-21 MED ORDER — SUCCINYLCHOLINE CHLORIDE 200 MG/10ML IV SOSY
PREFILLED_SYRINGE | INTRAVENOUS | Status: DC | PRN
  Administered 2016-03-21: 160 mg via INTRAVENOUS

## 2016-03-21 MED ORDER — OXYCODONE HCL 5 MG/5ML PO SOLN: 5.0000 mg | ORAL | Status: DC | PRN

## 2016-03-21 MED ORDER — STERILE WATER FOR IRRIGATION IR SOLN
Status: DC | PRN
  Administered 2016-03-21: 1000 mL

## 2016-03-21 MED ORDER — PROMETHAZINE HCL 25 MG/ML IJ SOLN: 12.5000 mg | Freq: Four times a day (QID) | INTRAMUSCULAR | Status: DC | PRN

## 2016-03-21 MED ORDER — LIDOCAINE 2% (20 MG/ML) 5 ML SYRINGE
INTRAMUSCULAR | Status: AC
  Filled 2016-03-21: qty 5

## 2016-03-21 MED ORDER — PREMIER PROTEIN SHAKE: 2.0000 [oz_av] | ORAL | Status: DC

## 2016-03-21 MED ORDER — ROCURONIUM BROMIDE 10 MG/ML (PF) SYRINGE
PREFILLED_SYRINGE | INTRAVENOUS | Status: DC | PRN
  Administered 2016-03-21: 20 mg via INTRAVENOUS
  Administered 2016-03-21: 10 mg via INTRAVENOUS
  Administered 2016-03-21 (×2): 20 mg via INTRAVENOUS
  Administered 2016-03-21: 50 mg via INTRAVENOUS
  Administered 2016-03-21: 10 mg via INTRAVENOUS
  Administered 2016-03-21: 20 mg via INTRAVENOUS

## 2016-03-21 SURGICAL SUPPLY — 78 items
ADH SKN CLS APL DERMABOND .7 (GAUZE/BANDAGES/DRESSINGS) ×1
APL SRG 32X5 SNPLK LF DISP (MISCELLANEOUS)
APPLICATOR COTTON TIP 6IN STRL (MISCELLANEOUS) IMPLANT
APPLIER CLIP ROT 10 11.4 M/L (STAPLE)
APPLIER CLIP ROT 13.4 12 LRG (CLIP)
APR CLP LRG 13.4X12 ROT 20 MLT (CLIP)
APR CLP MED LRG 11.4X10 (STAPLE)
BLADE SURG SZ11 CARB STEEL (BLADE) ×3 IMPLANT
CABLE HIGH FREQUENCY MONO STRZ (ELECTRODE) ×3 IMPLANT
CHLORAPREP W/TINT 26ML (MISCELLANEOUS) ×6 IMPLANT
CLIP APPLIE ROT 10 11.4 M/L (STAPLE) IMPLANT
CLIP APPLIE ROT 13.4 12 LRG (CLIP) IMPLANT
COVER SURGICAL LIGHT HANDLE (MISCELLANEOUS) ×1 IMPLANT
DECANTER SPIKE VIAL GLASS SM (MISCELLANEOUS) ×3 IMPLANT
DERMABOND ADVANCED (GAUZE/BANDAGES/DRESSINGS) ×2
DERMABOND ADVANCED .7 DNX12 (GAUZE/BANDAGES/DRESSINGS) ×1 IMPLANT
DEVICE PMI PUNCTURE CLOSURE (MISCELLANEOUS) ×2 IMPLANT
DEVICE SUT QUICK LOAD TK 5 (STAPLE) IMPLANT
DEVICE SUT TI-KNOT TK 5X26 (MISCELLANEOUS) IMPLANT
DEVICE SUTURE ENDOST 10MM (ENDOMECHANICALS) IMPLANT
DEVICE TI KNOT TK5 (MISCELLANEOUS)
DEVICE TROCAR PUNCTURE CLOSURE (ENDOMECHANICALS) IMPLANT
DRAPE UTILITY XL STRL (DRAPES) ×6 IMPLANT
ELECT L-HOOK LAP 45CM DISP (ELECTROSURGICAL)
ELECT PENCIL ROCKER SW 15FT (MISCELLANEOUS) IMPLANT
ELECT REM PT RETURN 9FT ADLT (ELECTROSURGICAL) ×3
ELECTRODE L-HOOK LAP 45CM DISP (ELECTROSURGICAL) IMPLANT
ELECTRODE REM PT RTRN 9FT ADLT (ELECTROSURGICAL) ×1 IMPLANT
GAUZE SPONGE 4X4 12PLY STRL (GAUZE/BANDAGES/DRESSINGS) IMPLANT
GLOVE BIO SURGEON STRL SZ7.5 (GLOVE) ×3 IMPLANT
GLOVE INDICATOR 8.0 STRL GRN (GLOVE) ×3 IMPLANT
GOWN STRL REUS W/TWL XL LVL3 (GOWN DISPOSABLE) ×11 IMPLANT
HOVERMATT SINGLE USE (MISCELLANEOUS) ×3 IMPLANT
IRRIG SUCT STRYKERFLOW 2 WTIP (MISCELLANEOUS) ×3
IRRIGATION SUCT STRKRFLW 2 WTP (MISCELLANEOUS) ×1 IMPLANT
KIT BASIN OR (CUSTOM PROCEDURE TRAY) ×3 IMPLANT
MARKER SKIN DUAL TIP RULER LAB (MISCELLANEOUS) ×3 IMPLANT
NDL SPNL 22GX3.5 QUINCKE BK (NEEDLE) ×1 IMPLANT
NEEDLE SPNL 22GX3.5 QUINCKE BK (NEEDLE) ×3 IMPLANT
PACK UNIVERSAL I (CUSTOM PROCEDURE TRAY) ×3 IMPLANT
QUICK LOAD TK 5 (STAPLE)
RELOAD STAPLE 60 3.6 BLU REG (STAPLE) ×1 IMPLANT
RELOAD STAPLE 60 4.1 GRN THCK (STAPLE) ×1 IMPLANT
RELOAD STAPLE 60 BLK VRY/THCK (STAPLE) IMPLANT
RELOAD STAPLER 60MM BLK (STAPLE) ×1 IMPLANT
RELOAD STAPLER BLUE 60MM (STAPLE) ×3 IMPLANT
RELOAD STAPLER GOLD 60MM (STAPLE) ×1 IMPLANT
RELOAD STAPLER GREEN 60MM (STAPLE) IMPLANT
SCISSORS LAP 5X45 EPIX DISP (ENDOMECHANICALS) ×3 IMPLANT
SEALANT SURGICAL APPL DUAL CAN (MISCELLANEOUS) IMPLANT
SHEARS HARMONIC ACE PLUS 45CM (MISCELLANEOUS) ×3 IMPLANT
SLEEVE ADV FIXATION 5X100MM (TROCAR) ×6 IMPLANT
SLEEVE GASTRECTOMY 40FR VISIGI (MISCELLANEOUS) ×3 IMPLANT
SOLUTION ANTI FOG 6CC (MISCELLANEOUS) ×3 IMPLANT
SPONGE LAP 18X18 X RAY DECT (DISPOSABLE) ×3 IMPLANT
STAPLER ECHELON BIOABSB 60 FLE (MISCELLANEOUS) ×12 IMPLANT
STAPLER ECHELON LONG 60 440 (INSTRUMENTS) IMPLANT
STAPLER RELOAD 60MM BLK (STAPLE) ×3
STAPLER RELOAD BLUE 60MM (STAPLE) ×9
STAPLER RELOAD GOLD 60MM (STAPLE) ×3
STAPLER RELOAD GREEN 60MM (STAPLE)
SUT MNCRL AB 4-0 PS2 18 (SUTURE) ×5 IMPLANT
SUT SURGIDAC NAB ES-9 0 48 120 (SUTURE) IMPLANT
SUT VICRYL 0 TIES 12 18 (SUTURE) ×3 IMPLANT
SYR 10ML ECCENTRIC (SYRINGE) ×3 IMPLANT
SYR 20CC LL (SYRINGE) ×3 IMPLANT
SYR 50ML LL SCALE MARK (SYRINGE) ×3 IMPLANT
TOWEL OR 17X26 10 PK STRL BLUE (TOWEL DISPOSABLE) ×3 IMPLANT
TOWEL OR NON WOVEN STRL DISP B (DISPOSABLE) ×3 IMPLANT
TRAY FOLEY W/METER SILVER 16FR (SET/KITS/TRAYS/PACK) IMPLANT
TROCAR ADV FIXATION 5X100MM (TROCAR) ×3 IMPLANT
TROCAR BLADELESS 15MM (ENDOMECHANICALS) ×3 IMPLANT
TROCAR BLADELESS OPT 5 100 (ENDOMECHANICALS) ×3 IMPLANT
TUBE CALIBRATION LAPBAND (TUBING) ×2 IMPLANT
TUBING CONNECTING 10 (TUBING) ×2 IMPLANT
TUBING CONNECTING 10' (TUBING) ×1
TUBING ENDO SMARTCAP (MISCELLANEOUS) ×3 IMPLANT
TUBING INSUF HEATED (TUBING) ×3 IMPLANT

## 2016-03-21 NOTE — Progress Notes (Signed)
CPAP set up in room, pt stated he has him mask and tubing in the car and that his mom will bring it in for him. RT made pt aware that when he gets his tubing and mask to call and we will get him on the CPAP for tonight.

## 2016-03-21 NOTE — Op Note (Signed)
03/21/2016 Rosana BergerHayward Harpole III 01/02/1981 [id number]   PRE-OPERATIVE DIAGNOSIS:     Morbid obesity (BMI 43)   Obstructive sleep apnea   Elevated blood pressure reading without diagnosis of hypertension   Pre-diabetes   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 tophus   Hypertriglyceridemia   POST-OPERATIVE DIAGNOSIS:  same  PROCEDURE:  Procedure(s): LAPAROSCOPIC SLEEVE GASTRECTOMY  UPPER GI ENDOSCOPY  SURGEON:  Surgeon(s): Atilano InaEric M Narmeen Kerper, MD FACS FASMBS  ASSISTANTS: Feliciana RossettiLuke Kinsinger, MD  ANESTHESIA:   general  DRAINS: none   EBL: < 50cc  BOUGIE: 40 fr ViSiGi  LOCAL MEDICATIONS USED:  MARCAINE + Exparel  SPECIMEN:  Source of Specimen:  Greater curvature of stomach  DISPOSITION OF SPECIMEN:  PATHOLOGY  COUNTS:  YES  INDICATION FOR PROCEDURE: This is a very pleasant 35 y.o.-year-old morbidly obese male who has had unsuccessful attempts for sustained weight loss. The patient presents today for a planned laparoscopic sleeve gastrectomy with upper endoscopy. We have discussed the risk and benefits of the procedure extensively preoperatively. Please see my separate notes.  PROCEDURE: After obtaining informed consent and receiving 5000 units of subcutaneous heparin, the patient [facility] and placed supine on the operating room table. General endotracheal anesthesia was established. Sequential compression devices were placed. A orogastric tube was placed. The patient's abdomen was prepped and draped in the usual standard surgical fashion. The patient received preoperative IV antibiotic. A surgical timeout was performed.  Access to the abdomen was achieved using a 5 mm 0 laparoscope thru a 5 mm trocar In the left upper Quadrant 2 fingerbreadths below the left subcostal margin using the Optiview technique. Pneumoperitoneum was smoothly established up to 15 mm of mercury. The laparoscope was advanced and the abdominal cavity was  surveilled. There were omental adhesions in the upper midline and left upper quadrant. The right upper quadrant was free. A 5 mm trocar was placed in the lateral right upper quadrant and I placed an additional 5 mm trocar above into the left of the umbilicus. Then using EndoShears without electrocautery I took down the omental adhesions in the upper midline and left upper quadrant. It was attached somewhat to the falciform ligament was taken down without difficulty. The patient was then placed in reverse Trendelenburg.   The Wolf liver retractor was placed under the left lobe of the liver through a 5 mm trocar incision site in the subxiphoid position. I was than able to place a 15 mm trocar in the mid right abdomen. A final 5 mm trocar was placed in the lateral LUQ.  Exparel was then infiltrated in upper abdominal b/l lateral abdominal walls as a TAPP block under laparoscopic guidance.   The stomach was inspected. It was completely decompressed and the orogastric tube was removed. His preoperative upper GI was negative for hiatal hernia.  There was no anterior dimple that was obviously visible. The calibration tube was placed in the oropharynx and guided down into the stomach by the CRNA. 10 mL of air was insufflated into the calibration balloon. The calibration tubing was then gently pulled back by the CRNA and it did not slide past the GE junction. At this point the calibration tubing was desufflated and pulled back into the esophagus. This confirmed my suspicion of a no clinically significant hiatal hernia.  At this point the calibration tubing was deflated and removed from the patient's body.   We identified the pylorus and measured 6 cm proximal to the pylorus  and identified an area of where we would start taking down the short gastric vessels. Harmonic scalpel was used to take down the short gastric vessels along the greater curvature of the stomach. We were able to enter the lesser sac. We continued to  march along the greater curvature of the stomach taking down the short gastrics. As we approached the gastrosplenic ligament we took care in this area not to injure the spleen. The fundus of the stomach was intimately attached to the tip of the spleen; however, We were able to take down the entire gastrosplenic ligament. We then mobilized the fundus away from the left crus of diaphragm. There were  significant posterior gastric avascular attachments which were taken down with EndoShears. This left the stomach completely mobilized. No vessels had been taken down along the lesser curvature of the stomach.  We then reidentified the pylorus. A 40Fr ViSiGi was then placed in the oropharynx and advanced down into the stomach and placed in the distal antrum and positioned along the lesser curvature. It was placed under suction which secured the 40Fr ViSiGi in place along the lesser curve. Then using the Ethicon echelon 60 mm stapler with a black load with Seamguard, I placed a stapler along the antrum approximately 5 cm from the pylorus. The stapler was angled so that there is ample room at the angularis incisura. I then fired the first staple load after inspecting it posteriorly to ensure adequate space both anteriorly and posteriorly. At this point I still was not completely past the angularis so with a green load with Seamguard, I placed the stapler in position just inside the prior stapleline. We then rotated the stomach to insure that there was adequate anteriorly as well as posteriorly. The stapler was then fired.  At this point I started using 60 mm gold load staple cartridge x1 with Seamguard. The echelon stapler was then repositioned with a 60 mm blue load with Seamguard and we continued to march up along the ViSiGi. My assistant was holding traction along the greater curvature stomach along the cauterized short gastric vessels ensuring that the stomach was symmetrically retracted. Prior to each firing of the  staple, we rotated the stomach to ensure that there is adequate stomach left.  As we approached the fundus, I used 60 mm blue cartridge with Seamguard aiming slightly lateral to the esophageal fat pad after mobilizing it. Although the staples on this fire had completely gone thru the last part of the stomach it had not completely cut it. Therefore 1 additional 60 blue load was used to free the remaining stomach. The sleeve was inspected. There is no evidence of cork screw. The staple line appeared hemostatic. The CRNA inflated the ViSiGi to the green zone and the upper abdomen was flooded with saline. There were no bubbles. The sleeve was decompressed and the ViSiGi removed. My assistant scrubbed out and performed an upper endoscopy. The sleeve easily distended with air and the scope was easily advanced to the pylorus. There is no evidence of internal bleeding or cork screwing. There was no narrowing at the angularis. There is no evidence of bubbles. Please see his operative note for further details. The gastric sleeve was decompressed and the endoscope was removed.  The greater curvature the stomach was grasped with a laparoscopic grasper and removed from the 15 mm trocar site.  The liver retractor was removed. I then closed the 15 mm trocar site with 2 interrupted 0 Vicryl sutures through the fascia using  the endoclose. The closure was viewed laparoscopically and it was airtight. Remaining Exparel was then infiltrated in the preperitoneal spaces around the trocar sites. Pneumoperitoneum was released. All trocar sites were closed with a 4-0 Monocryl in a subcuticular fashion followed by the application of Dermabond. The patient was extubated and taken to the recovery room in stable condition. All needle, instrument, and sponge counts were correct x2. There are no immediate complications  (1) 60 mm black with Seamguard (1) 60 mm green with Seamguard (1) 60 mm gold with seamguard (2) 60 mm blue with 1  seamguard  PLAN OF CARE: Admit to inpatient   PATIENT DISPOSITION:  PACU - hemodynamically stable.   Delay start of Pharmacological VTE agent (>24hrs) due to surgical blood loss or risk of bleeding:  no  Mary Sella. Andrey Campanile, MD, FACS FASMBS General, Bariatric, & Minimally Invasive Surgery [facility], [place]

## 2016-03-21 NOTE — Anesthesia Postprocedure Evaluation (Signed)
Anesthesia Post Note  Patient: Donald BergerHayward Skillin [name]  Procedure(s) Performed: Procedure(s) (LRB): LAPAROSCOPIC GASTRIC SLEEVE RESECTION, UPPER ENDO (N/A)  Patient location during evaluation: PACU Anesthesia Type: General Level of consciousness: awake and alert Pain management: pain level controlled Vital Signs Assessment: post-procedure vital signs reviewed and stable Respiratory status: spontaneous breathing, nonlabored ventilation, respiratory function stable and patient connected to nasal cannula oxygen Cardiovascular status: blood pressure returned to baseline and stable Postop Assessment: no signs of nausea or vomiting Anesthetic complications: no    Last Vitals:  Vitals:   03/21/16 1745 03/21/16 1800  BP: (!) 143/72 134/74  Pulse: 76 82  Resp: 12 12  Temp:  36.4 C    Last Pain:  Vitals:   03/21/16 1745  PainSc: 8                  Cecile HearingStephen Edward Turk

## 2016-03-21 NOTE — Transfer of Care (Signed)
Immediate Anesthesia Transfer of Care Note  Patient: Donald [name]  Procedure(s) Performed: Procedure(s): LAPAROSCOPIC GASTRIC SLEEVE RESECTION, UPPER ENDO (N/A)  Patient Location: PACU  Anesthesia Type:General  Level of Consciousness:  sedated, patient cooperative and responds to stimulation  Airway & Oxygen Therapy:Patient Spontanous Breathing and Patient connected to face mask oxgen  Post-op Assessment:  Report given to PACU RN and Post -op Vital signs reviewed and stable  Post vital signs:  Reviewed and stable  Last Vitals: There were no vitals filed for this visit.  Complications: No apparent anesthesia complications

## 2016-03-21 NOTE — Op Note (Signed)
Preoperative diagnosis: laparoscopic sleeve gastrectomy  Postoperative diagnosis: Same   Procedure: Upper endoscopy   Surgeon: Baylie Drakes, M.D.  Anesthesia: Gen.   Indications for procedure: This patient was undergoing a laparoscopic sleeve gastrectomy.   Description of procedure: The endoscopy was placed in the mouth and into the oropharynx and under endoscopic vision it was advanced to the esophagogastric junction. The pouch was insufflated and no bleeding or bubbles were seen. The GEJ was identified at 42cm from the teeth. No bleeding or leaks were detected. The scope was withdrawn without difficulty.   Ninoshka Wainwright, M.D. General, Bariatric, & Minimally Invasive Surgery [facility]    

## 2016-03-21 NOTE — Interval H&P Note (Signed)
History and Physical Interval Note:  03/21/2016 1:46 PM  Donald [name]  has presented today for surgery, with the diagnosis of Morbid Obesity, OSA, HTN, Pre-Diabetes  The various methods of treatment have been discussed with the patient and family. After consideration of risks, benefits and other options for treatment, the patient has consented to  Procedure(s): LAPAROSCOPIC GASTRIC SLEEVE RESECTION, UPPER ENDO (N/A) as a surgical intervention .  The patient's history has been reviewed, patient examined, no change in status, stable for surgery.  I have reviewed the patient's chart and labs.  Questions were answered to the patient's satisfaction.    Mary SellaEric M. Andrey CampanileWilson, MD, FACS General, Bariatric, & Minimally Invasive Surgery [facility] [place]  [facility] StWILSON,[name]

## 2016-03-21 NOTE — Anesthesia Procedure Notes (Signed)
Procedure Name: Intubation Date/Time: 03/21/2016 2:02 PM Performed by: Paris LoreBLANTON, Gaetana Kawahara M Pre-anesthesia Checklist: Patient identified, Emergency Drugs available, Suction available, Patient being monitored and Timeout performed Patient Re-evaluated:Patient Re-evaluated prior to inductionOxygen Delivery Method: Circle system utilized Preoxygenation: Pre-oxygenation with 100% oxygen Intubation Type: IV induction Ventilation: Mask ventilation without difficulty Laryngoscope Size: Mac and 4 Grade View: Grade III Tube type: Oral Tube size: 7.5 mm Number of attempts: 1 Airway Equipment and Method: Bougie stylet Placement Confirmation: ETT inserted through vocal cords under direct vision,  positive ETCO2,  CO2 detector and breath sounds checked- equal and bilateral Secured at: 23 cm Tube secured with: Tape Dental Injury: Teeth and Oropharynx as per pre-operative assessment

## 2016-03-21 NOTE — H&P (View-Only) (Signed)
Donald [name] 03/01/2016 2:45 PM Location: [facility] Patient #: [id number] DOB: 15-Sep-1980 Married / Language: English / Race: Black or African American Male  History of Present Illness [name] M. Einer Meals MD; 03/02/2016 8:30 AM) The patient is a 35 year old male who presents for a pre-op visit. He comes in today for his preoperative visit. I initially met him in June. His weight at that time was 358 pounds. He denies any significant medical changes since I initially met him. He has been treated for vitamin D deficiency. He denies any chest pain, chest pressure, source of breath, orthopnea, paroxysmal nocturnal dyspnea, reflux, jaw pain, nausea, melena or hematochezia.  His upper GI, chest x-ray and EKG were unremarkable. His bariatric evaluation labs in October were unremarkable. His lipid panel was slightly abnormal. Total cholesterol 217, triglyceride level 318, HDL level 48, LDL level 105. Hemoglobin A1c 6. Vitamin D level was 19  He continues to use his CPAP.   Problem List/Past Medical [name] [name], MD; 03/02/2016 8:32 AM) PREDIABETES (R73.03) MORBID OBESITY WITH BMI OF 40.0-44.9, ADULT (E66.01) VITAMIN D DEFICIENCY (E55.9)  Other Problems [name], MD; 03/02/2016 8:32 AM) Hypercholesterolemia BLOOD PRESSURE ELEVATED WITHOUT HISTORY OF HTN (R03.0) OBSTRUCTIVE SLEEP APNEA ON CPAP (G47.33) Hemorrhoids Back Pain  Past Surgical History [name], MD; 03/02/2016 8:32 AM) Oral Surgery  Diagnostic Studies History [name], MD; 03/02/2016 8:32 AM) Colonoscopy never  Allergies ([place], [place]; 03/01/2016 2:49 PM) No Known Drug Allergies 10/07/2015  Medication History [name], MD; 03/02/2016 8:32 AM) Allopurinol (300MG Tablet, Daily Oral) Active. Peridex (0.12% Solution, Mouth/Throat daily) Active. Ibuprofen (800MG Tablet, Oral as needed) Active. Indocin (50MG Capsule, Oral daily) Active. Vitamin D (50000UNIT  Capsule, Oral daily) Active. Medications Reconciled OxyCODONE HCl (5MG/5ML Solution, 5-10 Milliliter Oral every four hours, as needed, Taken starting 03/01/2016) Active. Protonix (40MG Tablet DR, 1 (one) Tablet Oral daily, Taken starting 03/01/2016) Active. Zofran ODT (4MG Tablet Disint, 1 (one) Tablet Disperse Oral every six hours, as needed, Taken starting 03/01/2016) Active. Indomethacin (50MG Capsule, Oral) Active. DiazePAM (5MG Tablet, Oral) Active.  Social History [name], MD; 03/02/2016 8:32 AM) No drug use Tobacco use Former smoker. No caffeine use Alcohol use Occasional alcohol use.  Family History [name], MD; 03/02/2016 8:32 AM) Arthritis Mother. Cerebrovascular Accident Family Members In General. Diabetes Mellitus Family Members In General, Father. Prostate Cancer Family Members In General, Father. Hypertension Father, Mother. Migraine Headache Father.     Review of Systems [name] M. Armandina Iman MD; 03/01/2016 7:20 PM) General Present- Weight Gain and Weight Loss. Not Present- Appetite Loss, Chills, Fatigue, Fever and Night Sweats. HEENT Present- Ringing in the Ears. Not Present- Earache, Hearing Loss, Hoarseness, Nose Bleed, Oral Ulcers, Seasonal Allergies, Sinus Pain, Sore Throat, Visual Disturbances, Wears glasses/contact lenses and Yellow Eyes. Respiratory Not Present- Bloody sputum, Chronic Cough, Difficulty Breathing, Snoring and Wheezing. Breast Not Present- Breast Mass, Breast Pain, Nipple Discharge and Skin Changes. Cardiovascular Not Present- Chest Pain, Difficulty Breathing Lying Down, Leg Cramps, Palpitations, Rapid Heart Rate, Shortness of Breath and Swelling of Extremities. Gastrointestinal Not Present- Abdominal Pain, Bloating, Bloody Stool, Change in Bowel Habits, Chronic diarrhea, Constipation, Difficulty Swallowing, Excessive gas, Gets full quickly at meals, Hemorrhoids, Indigestion, Nausea, Rectal Pain and Vomiting. Male  Genitourinary Not Present- Blood in Urine, Change in Urinary Stream, Frequency, Impotence, Nocturia, Painful Urination, Urgency and Urine Leakage. Musculoskeletal Present- Back Pain, Joint Stiffness and Swelling of Extremities. Not Present- Joint Pain, Muscle Pain and  Muscle Weakness. Neurological Not Present- Decreased Memory, Fainting, Headaches, Numbness, Seizures, Tingling, Tremor, Trouble walking and Weakness. Psychiatric Not Present- Anxiety, Bipolar, Change in Sleep Pattern, Depression, Fearful and Frequent crying. Endocrine Not Present- Cold Intolerance, Excessive Hunger, Hair Changes, Heat Intolerance and New Diabetes. Hematology Not Present- Easy Bruising, Excessive bleeding, Gland problems, HIV and Persistent Infections.  Vitals Bary Castilla Bradford CMA; 03/01/2016 2:53 PM) 03/01/2016 2:53 PM Weight: 362.8 lb Height: 77.5in Body Surface Area: 2.89 m Body Mass Index: 42.47 kg/m  Temp.: 98.47F  Pulse: 90 (Regular)  BP: 132/82 (Sitting, Left Arm, Standard)      Physical Exam [name] M. Carsten Carstarphen MD; 03/01/2016 7:20 PM)  General Mental Status-Alert. General Appearance-Consistent with stated age. Hydration-Well hydrated. Voice-Normal. Note: Morbidly obese, mainly evenly distributed  Head and Neck Head-normocephalic, atraumatic with no lesions or palpable masses. Trachea-midline. Thyroid Gland Characteristics - normal size and consistency.  Eye Eyeball - Bilateral-Extraocular movements intact. Sclera/Conjunctiva - Bilateral-No scleral icterus.  Chest and Lung Exam Chest and lung exam reveals -quiet, even and easy respiratory effort with no use of accessory muscles and on auscultation, normal breath sounds, no adventitious sounds and normal vocal resonance. Inspection Chest Wall - Normal. Back - normal.  Breast - Did not examine.  Cardiovascular Cardiovascular examination reveals -normal heart sounds, regular rate and rhythm with no murmurs and  normal pedal pulses bilaterally.  Abdomen Inspection Inspection of the abdomen reveals - No Hernias. Skin - Scar - no surgical scars. Palpation/Percussion Palpation and Percussion of the abdomen reveal - Soft, Non Tender, No Rebound tenderness, No Rigidity (guarding) and No hepatosplenomegaly. Auscultation Auscultation of the abdomen reveals - Bowel sounds normal.  Peripheral Vascular Upper Extremity Palpation - Pulses bilaterally normal.  Neurologic Neurologic evaluation reveals -alert and oriented x 3 with no impairment of recent or remote memory. Mental Status-Normal.  Neuropsychiatric The patient's mood and affect are described as -normal. Judgment and Insight-insight is appropriate concerning matters relevant to self.  Musculoskeletal Normal Exam - Left-Upper Extremity Strength Normal and Lower Extremity Strength Normal. Normal Exam - Right-Upper Extremity Strength Normal and Lower Extremity Strength Normal. Note: Bilateral knee crepitus  Lymphatic Head & Neck  General Head & Neck Lymphatics: Bilateral - Description - Normal. Axillary - Did not examine. Femoral & Inguinal - Did not examine.    Assessment & Plan [name] M. Chisum Habenicht MD; 03/02/2016 8:32 AM)  MORBID OBESITY WITH BMI OF 40.0-44.9, ADULT (E66.01) Impression: We reviewed his preoperative workup. We discussed results of his upper GI and chest x-ray and EKG. We discussed results of his blood work. He took high-dose vitamin D supplementation. He is accompanied by his wife today. We discussed the typical hospital course as well as the recovery process. We discussed the typical issues that occurred during hospitalization like nausea, epigastric cramps and gas pain. We discussed the typical return to work schedule. We discussed the importance of the preoperative meal plan. All of their questions were asked and answered. He was given his postoperative prescriptions for pain, heartburn and nausea  today.  Current Plans Started OxyCODONE HCl 5MG/5ML, 5-10 Milliliter every four hours, as needed, 200 Milliliter, 03/01/2016, No Refill. Started Protonix 40MG, 1 (one) Tablet daily, #30, 30 days starting 03/01/2016, Ref. x3. Started Zofran ODT 4MG, 1 (one) Tablet Disperse every six hours, as needed, #20, 03/01/2016, No Refill. Pt Education - EMW_preopbariatric PREDIABETES (R73.03)  BLOOD PRESSURE ELEVATED WITHOUT HISTORY OF HTN (R03.0)  OBSTRUCTIVE SLEEP APNEA ON CPAP (G47.33) Impression: cont cpap  VITAMIN D DEFICIENCY (E55.9) Impression: Continued 2000  and 3000 international units of vitamin D per day  Leighton Ruff. Redmond Pulling, MD, FACS General, Bariatric, & Minimally Invasive Surgery [facility], [place]

## 2016-03-21 NOTE — Anesthesia Preprocedure Evaluation (Signed)
Anesthesia Evaluation  Patient identified by MRN, date of birth, ID band Patient awake    Reviewed: Allergy & Precautions, NPO status , Patient's Chart, lab work & pertinent test results  Airway Mallampati: III  TM Distance: >3 FB Neck ROM: Full    Dental  (+) Teeth Intact, Dental Advisory Given   Pulmonary sleep apnea and Continuous Positive Airway Pressure Ventilation , Current Smoker,    Pulmonary exam normal breath sounds clear to auscultation       Cardiovascular hypertension, Normal cardiovascular exam Rhythm:Regular Rate:Normal     Neuro/Psych PSYCHIATRIC DISORDERS Anxiety negative neurological ROS     GI/Hepatic Neg liver ROS, GERD  Medicated,  Endo/Other  Morbid obesity  Renal/GU negative Renal ROS     Musculoskeletal  (+) Arthritis , Osteoarthritis,    Abdominal   Peds  Hematology negative hematology ROS (+)   Anesthesia Other Findings Day of surgery medications reviewed with the patient.  Reproductive/Obstetrics                             Anesthesia Physical Anesthesia Plan  ASA: III  Anesthesia Plan: General   Post-op Pain Management:    Induction: Intravenous  Airway Management Planned: Oral ETT  Additional Equipment:   Intra-op Plan:   Post-operative Plan: Extubation in OR  Informed Consent: I have reviewed the patients History and Physical, chart, labs and discussed the procedure including the risks, benefits and alternatives for the proposed anesthesia with the patient or authorized representative who has indicated his/her understanding and acceptance.   Dental advisory given  Plan Discussed with: CRNA  Anesthesia Plan Comments: (Risks/benefits of general anesthesia discussed with patient including risk of damage to teeth, lips, gum, and tongue, nausea/vomiting, allergic reactions to medications, and the possibility of heart attack, stroke and death.  All  patient questions answered.  Patient wishes to proceed.)        Anesthesia Quick Evaluation

## 2016-03-22 ENCOUNTER — Encounter (HOSPITAL_COMMUNITY): Payer: Self-pay | Admitting: General Surgery

## 2016-03-22 LAB — CBC WITH DIFFERENTIAL/PLATELET
Basophils Absolute: 0 10*3/uL (ref 0.0–0.1)
Basophils Relative: 0 %
Eosinophils Absolute: 0 10*3/uL (ref 0.0–0.7)
Eosinophils Relative: 0 %
HEMATOCRIT: 46.2 % (ref 39.0–52.0)
HEMOGLOBIN: 15.2 g/dL (ref 13.0–17.0)
LYMPHS ABS: 1 10*3/uL (ref 0.7–4.0)
LYMPHS PCT: 9 %
MCH: 26.2 pg (ref 26.0–34.0)
MCHC: 32.9 g/dL (ref 30.0–36.0)
MCV: 79.5 fL (ref 78.0–100.0)
MONOS PCT: 5 %
Monocytes Absolute: 0.6 10*3/uL (ref 0.1–1.0)
NEUTROS ABS: 10.1 10*3/uL — AB (ref 1.7–7.7)
NEUTROS PCT: 86 %
Platelets: 241 10*3/uL (ref 150–400)
RBC: 5.81 MIL/uL (ref 4.22–5.81)
RDW: 14.8 % (ref 11.5–15.5)
WBC: 11.7 10*3/uL — ABNORMAL HIGH (ref 4.0–10.5)

## 2016-03-22 LAB — COMPREHENSIVE METABOLIC PANEL
ALK PHOS: 60 U/L (ref 38–126)
ALT: 48 U/L (ref 17–63)
ANION GAP: 10 (ref 5–15)
AST: 29 U/L (ref 15–41)
Albumin: 4.3 g/dL (ref 3.5–5.0)
BILIRUBIN TOTAL: 0.7 mg/dL (ref 0.3–1.2)
BUN: 12 mg/dL (ref 6–20)
CALCIUM: 9 mg/dL (ref 8.9–10.3)
CO2: 25 mmol/L (ref 22–32)
CREATININE: 1.03 mg/dL (ref 0.61–1.24)
Chloride: 98 mmol/L — ABNORMAL LOW (ref 101–111)
GFR calc non Af Amer: >60 mL/min (ref >60)
Glucose, Bld: 115 mg/dL — ABNORMAL HIGH (ref 65–99)
Potassium: 4.2 mmol/L (ref 3.5–5.1)
Sodium: 133 mmol/L — ABNORMAL LOW (ref 135–145)
TOTAL PROTEIN: 7.5 g/dL (ref 6.5–8.1)

## 2016-03-22 MED ORDER — OXYCODONE HCL 5 MG/5ML PO SOLN: 5.0000 mg | ORAL | 0 refills | Status: DC | PRN

## 2016-03-22 MED ORDER — ALUM & MAG HYDROXIDE-SIMETH 200-200-20 MG/5ML PO SUSP
15.0000 mL | Freq: Four times a day (QID) | ORAL | Status: DC | PRN
Start: 2016-03-22 — End: 2016-03-22

## 2016-03-22 NOTE — Progress Notes (Signed)
Patient ID: Donald [name], Donald [name]   DOB: 03/22/1981, 35 y.o.   MRN: [id number]  Progress Note: Metabolic and Bariatric Surgery Service   Subjective: Did well. Had some nausea overnight but it was controlled. Had some nausea immediately after the first small cup ice but tolerated the other ice throughout the night. Ambulated several times  Objective: Vital signs in last 24 hours: Temp:  [97.6 F (36.4 C)-99.4 F (37.4 C)] 98.7 F (37.1 C) (12/05 1011) Pulse Rate:  [74-88] 74 (12/05 1011) Resp:  [11-18] 18 (12/05 1011) BP: (125-150)/(68-92) 125/69 (12/05 1011) SpO2:  [99 %-100 %] 100 % (12/05 1011) Weight:  [154.2 kg (340 lb)] 154.2 kg (340 lb) (12/04 1228) Last BM Date: 03/21/16  Intake/Output from previous day: 12/04 0701 - 12/05 0700 In: 2605 [P.O.:30; I.V.:2575] Out: 520 [Urine:500; Blood:20] Intake/Output this shift: Total I/O In: 525 [I.V.:525] Out: -   Lungs: Clear to auscultation, nonlabored  Cardiovascular: Regular  Abd: Out, non-distended, mild appropriate tenderness, incisions-clean, dry and intact  Extremities: No edema, positive SCDs  Neuro: Appropriate, smiling  Lab Results: CBC   Recent Labs  03/21/16 1652 03/22/16 0447  WBC  --  11.7*  HGB 15.5 15.2  HCT 46.5 46.2  PLT  --  241   BMET  Recent Labs  03/22/16 0447  NA 133*  K 4.2  CL 98*  CO2 25  GLUCOSE 115*  BUN 12  CREATININE 1.03  CALCIUM 9.0   PT/INR No results for input(s): LABPROT, INR in the last 72 hours. ABG No results for input(s): PHART, HCO3 in the last 72 hours.  Invalid input(s): PCO2, PO2  Studies/Results:  Anti-infectives: Anti-infectives    Start     Dose/Rate Route Frequency Ordered Stop   03/21/16 1153  cefoTEtan in Dextrose 5% (CEFOTAN) IVPB 2 g     2 g Intravenous On call to O.R. 03/21/16 1153 03/21/16 1413      Medications: Scheduled Meds: . acetaminophen (TYLENOL) oral liquid 160 mg/5 mL  1,000 mg Oral Q6H  . enoxaparin (LOVENOX) injection  30 mg  Subcutaneous Q12H  . ondansetron (ZOFRAN) IV  4 mg Intravenous Q6H  . pantoprazole (PROTONIX) IV  40 mg Intravenous QHS  . [START ON 03/23/2016] protein supplement shake  2 oz Oral Q2H   Continuous Infusions: . 0.45 % NaCl with KCl 20 mEq / L 125 mL/hr at 03/22/16 0838   PRN Meds:.oxyCODONE **AND** acetaminophen, acetaminophen (TYLENOL) oral liquid 160 mg/5 mL, alum & mag hydroxide-simeth, diphenhydrAMINE, morphine injection, phenol, promethazine  Assessment/Plan: Patient Active Problem List   Diagnosis Date Noted  . Hypertriglyceridemia 03/21/2016  . S/P laparoscopic sleeve gastrectomy 03/21/2016  . Fatigue 02/01/2016  . Preventative health care 09/10/2015  . Encounter for screening for HIV 09/10/2015  . Family hx of prostate cancer 09/10/2015  . Situational anxiety 06/26/2015  . Family history of malignant neoplasm of prostate 02/20/2015  . Elevated blood pressure reading without diagnosis of hypertension 02/20/2015  . Morbid obesity (HCC) 02/20/2015  . Left foot pain 02/20/2015  . Pre-diabetes 02/20/2015  . Elevated TSH 02/20/2015  . 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 Obstructive sleep apnea 01/27/2015  . Hyperlipidemia LDL goal <100 01/27/2015   s/p Procedure(s): LAPAROSCOPIC GASTRIC SLEEVE RESECTION, UPPER ENDO 03/21/2016  Principal Problem:   Morbid obesity (HCC) Active Problems:   Obstructive sleep apnea   Elevated blood pressure reading without diagnosis of hypertension   Pre-diabetes   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 tophus  Hypertriglyceridemia   S/P laparoscopic sleeve gastrectomy  Doing well. No fever or tachycardia. Looks good. Can have water unlimited this morning. If tolerates water will advance to protein shakes Start discharge teaching, possible discharge later today Ambulate, pulm toilet Continue chemical DVT prophylaxis Continue C Pap at night Disposition:  LOS: 1 day  The patient will be in the  hospital for normal postop protocol  Atilano InaWILSON,Kelcee Bjorn M, MD [phone] [facility]

## 2016-03-22 NOTE — Progress Notes (Signed)
Pt was given discharge instructions and all questions were answered. Patient was taken to main entrance by nurse tech. Marianela Mandrell R McClean

## 2016-03-22 NOTE — Discharge Instructions (Signed)

## 2016-03-22 NOTE — Progress Notes (Signed)
Patient alert and oriented, Post op day 1.  Provided support and encouragement.  Encouraged pulmonary toilet, ambulation and small sips of liquids.  All questions answered.  Will continue to monitor. 

## 2016-03-22 NOTE — Progress Notes (Signed)
Patient alert and oriented, pain is controlled. Patient is tolerating fluids, advanced to protein shake today, patient is tolerating well.  Reviewed Gastric sleeve discharge instructions with patient and patient is able to articulate understanding.  Provided information on BELT program, Support Group and [facility] outpatient pharmacy. All questions answered, will continue to monitor.  

## 2016-03-22 NOTE — Plan of Care (Signed)
Problem: Food- and Nutrition-Related Knowledge Deficit (NB-1.1) Goal: Nutrition education Formal process to instruct or train a patient/client in a skill or to impart knowledge to help patients/clients voluntarily manage or modify food choices and eating behavior to maintain or improve health. Outcome: Completed/Met Date Met: 03/22/16 Nutrition Education Note  Received consult for diet education per DROP protocol.   Discussed 2 week post op diet with pt. Emphasized that liquids must be non carbonated, non caffeinated, and sugar free. Fluid goals discussed. Reviewed progression of diet to include soft proteins at 7-10 days post-op. Pt to follow up with outpatient bariatric RD for further diet progression after 2 weeks. Multivitamins and minerals also reviewed. Teach back method used, pt expressed understanding, expect good compliance.   Diet: First 2 Weeks  You will see the dietitian about two (2) weeks after your surgery. The dietitian will increase the types of foods you can eat if you are handling liquids well:  If you have severe vomiting or nausea and cannot handle clear liquids lasting longer than 1 day, call your surgeon  Protein Shake  Drink at least 2 ounces of shake 5-6 times per day  Each serving of protein shakes (usually 8 - 12 ounces) should have a minimum of:  15 grams of protein  And no more than 5 grams of carbohydrate  Goal for protein each day:  Men = 80 grams per day  Women = 60 grams per day  Protein powder may be added to fluids such as non-fat milk or Lactaid milk or Soy milk (limit to 35 grams added protein powder per serving)   Hydration  Slowly increase the amount of water and other clear liquids as tolerated (See Acceptable Fluids)  Slowly increase the amount of protein shake as tolerated  Sip fluids slowly and throughout the day  May use sugar substitutes in small amounts (no more than 6 - 8 packets per day; i.e. Splenda)   Fluid Goal  The first goal is to  drink at least 8 ounces of protein shake/drink per day (or as directed by the nutritionist); some examples of protein shakes are [employer], [employer], EAS Edge HP, and Unjury. See handout from pre-op Bariatric Education Class:  Slowly increase the amount of protein shake you drink as tolerated  You may find it easier to slowly sip shakes throughout the day  It is important to get your proteins in first  Your fluid goal is to drink 64 - 100 ounces of fluid daily  It may take a few weeks to build up to this  32 oz (or more) should be clear liquids  And  32 oz (or more) should be full liquids (see below for examples)  Liquids should not contain sugar, caffeine, or carbonation   Clear Liquids:  Water or Sugar-free flavored water (i.e. Fruit H2O, Propel)  Decaffeinated coffee or tea (sugar-free)  Crystal Lite, Wyler's Lite, Minute Maid Lite  Sugar-free Jell-O  Bouillon or broth  Sugar-free Popsicle: *Less than 20 calories each; Limit 1 per day   Full Liquids:  Protein Shakes/Drinks + 2 choices per day of other full liquids  Full liquids must be:  No More Than 12 grams of Carbs per serving  No More Than 3 grams of Fat per serving  Strained low-fat cream soup  Non-Fat milk  Fat-free Lactaid Milk  Sugar-free yogurt (Dannon Lite & Fit, Greek yogurt)     Clayton Bibles, MS, RD, LDN Pager: [phone] After Hours Pager: [phone]

## 2016-03-28 NOTE — Discharge Summary (Signed)
Physician Discharge Summary  Donald [name] TSV:[id number] DOB: [birth year] DOA: 03/21/2016  PCP: Enid Derry, MD  Admit date: 03/21/2016 Discharge date: 03/22/2016  Recommendations for Outpatient Follow-up:  1.   Follow-up Information    Donald Curry, MD Follow up.   Specialty:  General Surgery Contact information: [address] [place] [address] [phone]          Discharge Diagnoses:  Principal Problem:   Morbid obesity ([place]) Active Problems:   Obstructive sleep apnea   Elevated blood pressure reading without diagnosis of hypertension   Pre-diabetes   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 tophus   Hypertriglyceridemia   S/P laparoscopic sleeve gastrectomy   Surgical Procedure: Laparoscopic Sleeve Gastrectomy, upper endoscopy  Discharge Condition: Good Disposition: Home  Diet recommendation: Postoperative sleeve gastrectomy diet (liquids only)  Filed Weights   03/21/16 1228 03/22/16 1341  Weight: (!) 154.2 kg (340 lb) (!) 154.6 kg (340 lb 14.4 oz)     Hospital Course:  The patient was admitted for a planned laparoscopic sleeve gastrectomy. Please see operative note. Preoperatively the patient was given 5000 units of subcutaneous heparin for DVT prophylaxis. Postoperative prophylactic Lovenox dosing was started on the morning of postoperative day 1. On the evening of postoperative day 0, the patient was started on water and ice chips. On postoperative day 1 the patient had no fever or tachycardia and was tolerating water in their diet was gradually advanced throughout the day. The patient was ambulating without difficulty. Their vital signs are stable without fever or tachycardia. Their hemoglobin had remained stable. The patient was maintained on their home settings for CPAP therapy. The patient had received discharge instructions and counseling. They were deemed stable for discharge and had met discharge criteria   Discharge  Instructions  Discharge Instructions    Ambulate hourly while awake    Complete by:  As directed    Call MD for:  difficulty breathing, headache or visual disturbances    Complete by:  As directed    Call MD for:  persistant dizziness or light-headedness    Complete by:  As directed    Call MD for:  persistant nausea and vomiting    Complete by:  As directed    Call MD for:  redness, tenderness, or signs of infection (pain, swelling, redness, odor or green/yellow discharge around incision site)    Complete by:  As directed    Call MD for:  severe uncontrolled pain    Complete by:  As directed    Call MD for:  temperature >101 F    Complete by:  As directed    Diet bariatric full liquid    Complete by:  As directed    Discharge instructions    Complete by:  As directed    See bariatric discharge instructions   Incentive spirometry    Complete by:  As directed    Perform hourly while awake       Medication List    STOP taking these medications   chlorhexidine 0.12 % solution Commonly known as:  PERIDEX   ibuprofen 800 MG tablet Commonly known as:  ADVIL,MOTRIN     TAKE these medications   allopurinol 300 MG tablet Commonly known as:  ZYLOPRIM Take 1 tablet (300 mg total) by mouth at bedtime.   Calcium 500 MG Chew Chew by mouth 3 (three) times daily.   indomethacin 50 MG capsule Commonly known as:  INDOCIN Take 1 capsule (50 mg  total) by mouth 3 (three) times daily as needed. For gout flares Notes to patient:  Avoid NSAIDs for 6-8 weeks after surgery   multivitamin tablet Take 1 tablet by mouth daily.   oxyCODONE 5 MG/5ML solution Commonly known as:  ROXICODONE Take 5-10 mLs (5-10 mg total) by mouth every 4 (four) hours as needed for moderate pain or severe pain.   Vitamin D (Ergocalciferol) 50000 units Caps capsule Commonly known as:  DRISDOL Take 1 capsule (50,000 Units total) by mouth every 7 (seven) days. What changed:  additional instructions       Follow-up Information    Donald Curry, MD Follow up.   Specialty:  General Surgery Contact information: [name] [address] [phone]            The results of significant diagnostics from this hospitalization (including imaging, microbiology, ancillary and laboratory) are listed below for reference.    Significant Diagnostic Studies: No results found.  Labs: Basic Metabolic Panel:  Recent Labs Lab 03/22/16 0447  NA 133*  K 4.2  CL 98*  CO2 25  GLUCOSE 115*  BUN 12  CREATININE 1.03  CALCIUM 9.0   Liver Function Tests:  Recent Labs Lab 03/22/16 0447  AST 29  ALT 48  ALKPHOS 60  BILITOT 0.7  PROT 7.5  ALBUMIN 4.3    CBC:  Recent Labs Lab 03/21/16 1652 03/22/16 0447  WBC  --  11.7*  NEUTROABS  --  10.1*  HGB 15.5 15.2  HCT 46.5 46.2  MCV  --  79.5  PLT  --  241    CBG:  Recent Labs Lab 03/21/16 1642  GLUCAP 159*    Principal Problem:   Morbid obesity ([place]) Active Problems:   Obstructive sleep apnea   Elevated blood pressure reading without diagnosis of hypertension   Pre-diabetes   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 tophus   Hypertriglyceridemia   S/P laparoscopic sleeve gastrectomy   Time coordinating discharge: 15 min  Signed:  [name], MD [facility], [place] 03/28/2016, 10:49 AM

## 2016-04-05 ENCOUNTER — Encounter: Payer: 59 | Attending: General Surgery | Admitting: Dietician

## 2016-04-05 DIAGNOSIS — Z713 Dietary counseling and surveillance: Secondary | ICD-10-CM | POA: Diagnosis not present

## 2016-04-06 NOTE — Progress Notes (Signed)
Bariatric Class:  Appt start time: 1530 end time:  1630.  2 Week Post-Operative Nutrition Class  Patient was seen on 04/05/16 for Post-[facility]ative Nutrition [facility].   Surgery date: 03/21/2016 Surgery type: sleeve gastrectomy Start weight at [facility]: 357 lbs on 10/08/2015, 366 lbs on 03/07/16 Weight today: 317.8 lbs  Weight change: 48.2 lbs  TANITA  BODY COMP RESULTS  03/07/16 04/05/16   BMI (kg/m^2) 43.4 37.7   Fat Mass (lbs) 160.6 118.6   Fat Free Mass (lbs) 205.4 199.2   Total Body Water (lbs) 158.4 143.8   The following the learning objectives were met by the patient during this course:  Identifies Phase 3A (Soft, High Proteins) Dietary Goals and will begin from 2 weeks post-operatively to 2 months post-operatively  Identifies appropriate sources of fluids and proteins   States protein recommendations and appropriate sources post-operatively  Identifies the need for appropriate texture modifications, mastication, and bite sizes when consuming solids  Identifies appropriate multivitamin and calcium sources post-operatively  Describes the need for physical activity post-operatively and will follow MD recommendations  States when to call healthcare provider regarding medication questions or post-operative complications  Handouts given during class include:  Phase 3A: Soft, High Protein Diet Handout  Follow-Up Plan: Patient will follow-up at [facility] in 6 weeks for 2 month post-op nutrition visit for diet advancement per MD.

## 2016-04-21 ENCOUNTER — Telehealth (HOSPITAL_COMMUNITY): Payer: Self-pay

## 2016-04-21 NOTE — Telephone Encounter (Signed)
Made discharge phone call to patient asking the following questions.    1. Do you have someone to care for you now that you are home?  Yes 2. Are you having pain now that is not relieved by your pain medication?  No  3. Are you able to drink the recommended daily amount of fluids (48 ounces minimum/day) and protein (60-80 grams/day) as prescribed by the dietitian or nutritional counselor?  Yes 4. Are you taking the vitamins and minerals as prescribed?  Yes 5. Do you have the "on call" number to contact your surgeon if you have a problem or question?  Yes 6. Are your incisions free of redness, swelling or drainage? (If steri strips, address that these can fall off, shower as tolerated) Yes 7. Have your bowels moved since your surgery?  If not, are you passing gas?  Yes 8. Are you up and walking 3-4 times per day?  Yes 9. Were you provided your discharge medications before your surgery or before you were discharged from the hospital and are you taking them without problem?  Yes  Patient stated that he sometimes feels dizzy when he stands.  Patient not on BP medication.  Advised patient to check BP.  Pt has a follow up appointment with Dr. Andrey CampanileWilson on tomorrow.    Deep Bonawitz RN

## 2016-04-26 ENCOUNTER — Telehealth: Payer: Self-pay

## 2016-04-26 ENCOUNTER — Other Ambulatory Visit: Payer: Self-pay

## 2016-04-26 DIAGNOSIS — G4733 Obstructive sleep apnea (adult) (pediatric): Secondary | ICD-10-CM

## 2016-04-26 DIAGNOSIS — M1A09X0 Idiopathic chronic gout, multiple sites, without tophus (tophi): Secondary | ICD-10-CM

## 2016-04-26 NOTE — Telephone Encounter (Signed)
Patient called regarding his labs that was suppose to be done in October. Patient stated that he had surgery and was not able to come in at that time because of the surgery. Patient stated dr. lada  Wanted him to have his Uric acid  Drawn  and Cmp for his allopurinol. Patient will come in tomorrow  and get the [facility].

## 2016-04-27 ENCOUNTER — Other Ambulatory Visit: Payer: Self-pay

## 2016-04-27 DIAGNOSIS — M1A09X0 Idiopathic chronic gout, multiple sites, without tophus (tophi): Secondary | ICD-10-CM

## 2016-04-27 DIAGNOSIS — G4733 Obstructive sleep apnea (adult) (pediatric): Secondary | ICD-10-CM | POA: Diagnosis not present

## 2016-04-28 LAB — COMPREHENSIVE METABOLIC PANEL
A/G RATIO: 1.5 (ref 1.2–2.2)
ALK PHOS: 89 IU/L (ref 39–117)
ALT: 24 IU/L (ref 0–44)
AST: 17 IU/L (ref 0–40)
Albumin: 4.4 g/dL (ref 3.5–5.5)
BILIRUBIN TOTAL: 0.4 mg/dL (ref 0.0–1.2)
BUN/Creatinine Ratio: 8 — ABNORMAL LOW (ref 9–20)
BUN: 10 mg/dL (ref 6–20)
CHLORIDE: 101 mmol/L (ref 96–106)
CO2: 24 mmol/L (ref 18–29)
Calcium: 9.9 mg/dL (ref 8.7–10.2)
Creatinine, Ser: 1.3 mg/dL — ABNORMAL HIGH (ref 0.76–1.27)
GFR calc non Af Amer: 71 mL/min/{1.73_m2} (ref >59)
GFR, EST AFRICAN AMERICAN: 82 mL/min/{1.73_m2} (ref >59)
GLUCOSE: 83 mg/dL (ref 65–99)
Globulin, Total: 3 g/dL (ref 1.5–4.5)
POTASSIUM: 4.3 mmol/L (ref 3.5–5.2)
Sodium: 145 mmol/L — ABNORMAL HIGH (ref 134–144)
TOTAL PROTEIN: 7.4 g/dL (ref 6.0–8.5)

## 2016-05-07 ENCOUNTER — Telehealth: Payer: Self-pay | Admitting: Family Medicine

## 2016-05-07 NOTE — Telephone Encounter (Signed)
Please check on labs that were ordered I wanted the patient to have a uric acid level done and a BMP, not a CMP A basic metabolic panel has creatinine and electrolytes, while the CMP has liver function tests and other things that I don't need to monitor on the allopurinol Patient's creatinine was up just a little, perhaps dehydration after surgery Please ask him to have a uric acid level and a BMP done in the next week Stay really well hydrated, avoid NSAIDs Thank you

## 2016-05-09 NOTE — Telephone Encounter (Signed)
Ok I remember us talking about this.  I have to see what happen. I remember putting in the BMP after we spoke about it and putting the lab orders up front, I should of deleted the CMP so it would be any confusion.  It seems for some reason he only received the orders with CMP.  I will call the patient and explain and ask him to come in next week. Thanks

## 2016-05-11 ENCOUNTER — Ambulatory Visit: Payer: 59 | Admitting: Family Medicine

## 2016-05-19 ENCOUNTER — Encounter: Payer: Self-pay | Admitting: Family Medicine

## 2016-05-19 ENCOUNTER — Encounter: Payer: 59 | Attending: General Surgery | Admitting: Skilled Nursing Facility1

## 2016-05-19 ENCOUNTER — Ambulatory Visit (INDEPENDENT_AMBULATORY_CARE_PROVIDER_SITE_OTHER): Payer: 59 | Admitting: Family Medicine

## 2016-05-19 ENCOUNTER — Encounter: Payer: Self-pay | Admitting: Skilled Nursing Facility1

## 2016-05-19 DIAGNOSIS — Z9884 Bariatric surgery status: Secondary | ICD-10-CM

## 2016-05-19 DIAGNOSIS — R7303 Prediabetes: Secondary | ICD-10-CM | POA: Diagnosis not present

## 2016-05-19 DIAGNOSIS — Z713 Dietary counseling and surveillance: Secondary | ICD-10-CM | POA: Diagnosis not present

## 2016-05-19 DIAGNOSIS — M1A09X0 Idiopathic chronic gout, multiple sites, without tophus (tophi): Secondary | ICD-10-CM

## 2016-05-19 DIAGNOSIS — E785 Hyperlipidemia, unspecified: Secondary | ICD-10-CM | POA: Diagnosis not present

## 2016-05-19 DIAGNOSIS — E669 Obesity, unspecified: Secondary | ICD-10-CM

## 2016-05-19 MED ORDER — ALLOPURINOL 300 MG PO TABS: 300.0000 mg | ORAL_TABLET | Freq: Every day | ORAL | 5 refills | Status: DC

## 2016-05-19 NOTE — Patient Instructions (Addendum)
Avoid purine-rich foods such as Malawiturkey, gravies, chicken soup Let's get labs today Return in May for physical and fasting labs Start 1,000 iu of vitamin D3 once a day to maintain the level

## 2016-05-19 NOTE — Assessment & Plan Note (Signed)
F/u with surgeon; avoid NSAIDs

## 2016-05-19 NOTE — Assessment & Plan Note (Signed)
Check labs in May when he returns; I will bet that numbers will return to normal

## 2016-05-19 NOTE — Progress Notes (Signed)
BP 118/74   Pulse 90   Temp 98.3 F (36.8 C) (Oral)   Resp 14   Wt 286 lb (129.7 kg)   SpO2 93%   BMI 34.81 kg/m    Subjective:    Patient ID: Donald [name], male    DOB: 07/03/1980, 36 y.o.   MRN: [id number]  HPI: Donald BergerHayward Mand [name] is a 36 y.o. male  Chief Complaint  Patient presents with  . Follow-up    3 mnth, patient want to know if he is stil prediabetic  . Labs Only    labs need to be done    Patient is here for f/u; he had bariatric surgery He has lost 80 pounds so far He goes back to see his surgeon on February 14th He knows to stay away from NSAIDs; only tylenol  He has gout; ran out of allopurinol just a day ago or so He has prediabetes Last A1c was 6.3 on March 14, 2016  [facility] 05/19/2016 04/06/2016 03/08/2016 02/09/2016 02/01/2016  Decreased Interest 0 0 0 0 0  Down, Depressed, Hopeless 0 0 0 0 0  PHQ - 2 Score 0 0 0 0 0   Relevant past medical, surgical, family and social history reviewed Past Medical History:  Diagnosis Date  . Benign essential HTN   . Family hx of prostate cancer 09/10/2015  . Hyperlipidemia LDL goal <100   . Hypertension   . Pre-diabetes   . Situational anxiety   . Sleep apnea    Past Surgical History:  Procedure Laterality Date  . LAPAROSCOPIC GASTRIC SLEEVE RESECTION N/A 03/21/2016   Procedure: LAPAROSCOPIC GASTRIC SLEEVE RESECTION, UPPER ENDO;  Surgeon: Gaynelle AduEric [name], MD;  Location: [facility];  Service: General;  Laterality: N/A;  . WISDOM TOOTH EXTRACTION     Social History  Substance Use Topics  . Smoking status: Former Smoker    Types: Cigarettes  . Smokeless tobacco: Never Used     Comment: smokes at social events rarely  . Alcohol use 0.0 - 0.6 oz/week     Comment: socially    Interim medical history since last visit reviewed. Allergies and medications reviewed  Review of Systems Per HPI unless specifically indicated above     Objective:    BP 118/74   Pulse 90   Temp 98.3 F (36.8  C) (Oral)   Resp 14   Wt 286 lb (129.7 kg)   SpO2 93%   BMI 34.81 kg/m   Wt Readings from Last 3 Encounters:  05/19/16 286 lb (129.7 kg)  05/19/16 281 lb 3.2 oz (127.6 kg)  04/06/16 (!) 317 lb 12.8 oz (144.2 kg)    Physical Exam  Constitutional: He appears well-developed and well-nourished. No distress.  Obese, weight loss since surgery noted  Eyes: No scleral icterus.  Cardiovascular: Normal rate and regular rhythm.   Pulmonary/Chest: Effort normal and breath sounds normal. No respiratory distress.  Neurological: He is alert.  Skin: No pallor.  Psychiatric: He has a normal mood and affect.      Assessment & Plan:   Problem List Items Addressed This Visit      Musculoskeletal and Integument   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 tophus    Avoid purine-rich foods; hydrate; check uric acid and BMP      Relevant Medications   allopurinol (ZYLOPRIM) 300 MG tablet   Other Relevant Orders   Uric acid (Completed)   Basic Metabolic Panel (BMET) (Completed)  Other   S/P laparoscopic sleeve gastrectomy    F/u with surgeon; avoid NSAIDs      Pre-diabetes (Chronic)    Check labs in May when he returns; I will bet that numbers will return to normal      Relevant Orders   Basic Metabolic Panel (BMET) (Completed)   Hyperlipidemia LDL goal <100 (Chronic)    Check labs in May          Follow up plan: No Follow-up on file.  An after-visit summary was printed and given to the patient at check-out.  Please see the patient instructions which may contain other information and recommendations beyond what is mentioned above in the assessment and plan.  Meds ordered this encounter  Medications  . chlorhexidine (PERIDEX) 0.12 % solution    Refill:  0  . pantoprazole (PROTONIX) 40 MG tablet    Sig: TK 1 T PO QD    Refill:  3  . allopurinol (ZYLOPRIM) 300 MG tablet    Sig: Take 1 tablet (300 mg total) by mouth at bedtime.    Dispense:  30 tablet    Refill:   5    Orders Placed This Encounter  Procedures  . Uric acid  . Basic Metabolic Panel (BMET)

## 2016-05-19 NOTE — Progress Notes (Signed)
  Follow-up visit: 8 Weeks Post-Operative Sleeve Gastrectomy Surgery  Medical Nutrition Therapy:  Appt start time: 9:00 end time:  9:45  Primary concerns today: Post-operative Bariatric Surgery Nutrition Management. Pt states he is cold more often and he feels his energy is increasing. Pt states he wants to add fruit into his diet. Pt states he is happy with his wt loss so far.   Pt states he is having Thick mucous in the back of his mouth at night when sleeping, when he wakes up mucous is stuck in his throat, the mucous is clear with no smell and he states he can feel it in his throat. Pt states he tried to call the surgeons office because he was worried about this but 2 different nurses were rude and hung up on him.  Surgery date: 03/21/2016 Surgery type: sleeve gastrectomy [facility] lbs on 10/08/2015, 366 lbs on 03/07/16 Weight today: 281.2 lbs Weight change: 35.8 lbs  TANITA  BODY COMP RESULTS  03/07/16 04/05/16 05/19/2016   BMI (kg/m^2) 43.4 37.7 34.2   Fat Mass (lbs) 160.6 118.6 93.4   Fat Free Mass (lbs) 205.4 199.2 187.8   Total Body Water (lbs) 158.4 143.8 131.6    Preferred Learning Style:   No preference indicated   Learning Readiness:   Change in progress  24-hr recall: B (AM): protein bar (13 protein)  AND protein shake (40 protein) Snk (AM):  Slim jim or beef jerky (10 grams protein) L (PM): chili----bbq shredded chicken (3 ounces-21 grams protein) Snk (PM):slim jim or beef jerky---protein bar (10 grams protein) D (PM): chili 21 grams protein)  Snk (PM):   Fluid intake: water, protein shakes, G2 gatorade: about 64 ounces  Estimated total protein intake: 115 grams protein  Medications: See List Supplementation: Multivitamin and Calcium   Using straws: NO Drinking while eating: NO Hair loss: NO Carbonated beverages: NO N/V/D/C: NO, YES: ate too much, NO, NO  Recent physical activity:  GYM 3 days a week 60 minutes: wts and cardio   Progress  Towards Goal(s):  In progress.  Handouts given during visit include:  NS vegetables with protein   Nutritional Diagnosis:  [place]-3.3 Overweight/obesity related to past poor dietary habits and physical inactivity as evidenced by patient w/ recent sleeve gastrectomy surgery following dietary guidelines for continued weight loss.    Intervention:  Nutrition counseling. Dietitian educated the pt on advancing his diet to including vegetables and fruit (at pts request)-Dietitian educated the pt on the consequences of adding fruit to his diet.  Goals: A meal: 21 grams of protein which is about 3 ounces of a protein food A snack: 1-2 ounces or 7 to 14 grams of protein Be sure to moisten all of your foods: any sauce or gravy that is low in fat or sugar -Bring drink with you to the gym  -Keep chewing very well -Start with half a banana and go from there  Teaching Method Utilized: Visual Auditory Hands on  Barriers to learning/adherence to lifestyle change: none stated  Demonstrated degree of understanding via:  Teach Back   Monitoring/Evaluation:  Dietary intake, exercise, lap band fills, and body weight.

## 2016-05-19 NOTE — Assessment & Plan Note (Signed)
Avoid purine-rich foods; hydrate; check uric acid and BMP

## 2016-05-19 NOTE — Assessment & Plan Note (Signed)
Check labs in May

## 2016-05-19 NOTE — Patient Instructions (Addendum)
A meal: 21 grams of protein which is about 3 ounces of a protein food  A snack: 1-2 ounces or 7 to 14 grams of protein  Be sure to moisten all of your foods: any sauce or gravy that is low in fat or sugar  -Bring drink with you to the gym   -Keep chewing very well  -Start with half a banana and go from there

## 2016-05-20 ENCOUNTER — Ambulatory Visit: Payer: Self-pay | Admitting: Skilled Nursing Facility1

## 2016-05-20 LAB — BASIC METABOLIC PANEL
BUN / CREAT RATIO: 15 (ref 9–20)
BUN: 17 mg/dL (ref 6–20)
CHLORIDE: 100 mmol/L (ref 96–106)
CO2: 26 mmol/L (ref 18–29)
Calcium: 10.1 mg/dL (ref 8.7–10.2)
Creatinine, Ser: 1.16 mg/dL (ref 0.76–1.27)
GFR calc Af Amer: 94 mL/min/{1.73_m2} (ref >59)
GFR calc non Af Amer: 81 mL/min/{1.73_m2} (ref >59)
GLUCOSE: 81 mg/dL (ref 65–99)
Potassium: 4.9 mmol/L (ref 3.5–5.2)
Sodium: 139 mmol/L (ref 134–144)

## 2016-05-20 LAB — URIC ACID: URIC ACID: 6.8 mg/dL (ref 3.7–8.6)

## 2016-05-21 NOTE — Telephone Encounter (Signed)
Open phone note I have seen the patient Closing off on this phone note

## 2016-05-29 ENCOUNTER — Encounter: Payer: Self-pay | Admitting: Family Medicine

## 2016-05-31 ENCOUNTER — Telehealth: Payer: Self-pay | Admitting: Family Medicine

## 2016-05-31 DIAGNOSIS — M1A09X0 Idiopathic chronic gout, multiple sites, without tophus (tophi): Secondary | ICD-10-CM

## 2016-05-31 DIAGNOSIS — Z5181 Encounter for therapeutic drug level monitoring: Secondary | ICD-10-CM | POA: Insufficient documentation

## 2016-05-31 MED ORDER — ALLOPURINOL 100 MG PO TABS: 100.0000 mg | ORAL_TABLET | Freq: Every day | ORAL | 0 refills | Status: DC

## 2016-05-31 NOTE — Telephone Encounter (Signed)
Let pt know New Rx sent in Allopurinol increased to 400 mg daily (300 mg + 100 mg) Recheck uric acid and BMP two weeks after starting higher strength (I've entered orders) Thank you

## 2016-05-31 NOTE — Telephone Encounter (Signed)
Pt requesting return call pertaining to his medication and his diet

## 2016-05-31 NOTE — Telephone Encounter (Signed)
You Note from lab results last week:  Please let pt know that his creatinine came back down into the normal range, which is good news; his uric acid level has dropped nicely from 7.8 to 6.8; we want that number under 6; I would like to think he can bring this down the rest of the way on his own by avoiding foods rich in purines, and taking tart cherry juice; would he like to try diet and ongoing weight loss and/or increase his medicine (allopurinol)? If he wants to go up on allopurinol, I would have him start taking 300+100=400 mg daily and recheck uric acid and BMP in two weeks; let me know.  ------------------------------------------------------------------------------------------------------- The patient stated to me last week her prefer to maintain it with weight loss because he is already maintain his weight due to the surgery. Pt called in and ask can he increase the medication to 400 mg instead.

## 2016-05-31 NOTE — Assessment & Plan Note (Signed)
Check bmp on allopurinol

## 2016-05-31 NOTE — Assessment & Plan Note (Signed)
Increase allopurinol to 400 mg daily; recheck uric acid and BMP two weeks later

## 2016-06-01 ENCOUNTER — Other Ambulatory Visit: Payer: Self-pay

## 2016-06-01 DIAGNOSIS — M1A09X0 Idiopathic chronic gout, multiple sites, without tophus (tophi): Secondary | ICD-10-CM

## 2016-06-01 DIAGNOSIS — Z5181 Encounter for therapeutic drug level monitoring: Secondary | ICD-10-CM

## 2016-06-09 NOTE — Telephone Encounter (Signed)
Pt notified on 05/31/2016.

## 2016-06-10 DIAGNOSIS — G4733 Obstructive sleep apnea (adult) (pediatric): Secondary | ICD-10-CM | POA: Diagnosis not present

## 2016-06-26 ENCOUNTER — Telehealth: Payer: Self-pay | Admitting: Family Medicine

## 2016-06-26 NOTE — Telephone Encounter (Signed)
Please contact patient and ask him to come in ASAP for labs He does not need to be fasting Thank you

## 2016-06-27 NOTE — Telephone Encounter (Signed)
Pt notified. Pt states he will come in either today to get the orders or Thursday.

## 2016-08-03 DIAGNOSIS — Z5181 Encounter for therapeutic drug level monitoring: Secondary | ICD-10-CM | POA: Diagnosis not present

## 2016-08-03 DIAGNOSIS — M1A09X0 Idiopathic chronic gout, multiple sites, without tophus (tophi): Secondary | ICD-10-CM | POA: Diagnosis not present

## 2016-08-04 LAB — BASIC METABOLIC PANEL
BUN/Creatinine Ratio: 10 (ref 9–20)
BUN: 11 mg/dL (ref 6–20)
CALCIUM: 9.9 mg/dL (ref 8.7–10.2)
CO2: 27 mmol/L (ref 18–29)
CREATININE: 1.08 mg/dL (ref 0.76–1.27)
Chloride: 99 mmol/L (ref 96–106)
GFR calc Af Amer: 102 mL/min/{1.73_m2} (ref >59)
GFR calc non Af Amer: 88 mL/min/{1.73_m2} (ref >59)
GLUCOSE: 78 mg/dL (ref 65–99)
POTASSIUM: 4.4 mmol/L (ref 3.5–5.2)
SODIUM: 139 mmol/L (ref 134–144)

## 2016-08-04 LAB — URIC ACID: URIC ACID: 5.2 mg/dL (ref 3.7–8.6)

## 2016-08-05 ENCOUNTER — Other Ambulatory Visit: Payer: Self-pay | Admitting: Family Medicine

## 2016-08-05 MED ORDER — ALLOPURINOL 100 MG PO TABS: 100.0000 mg | ORAL_TABLET | Freq: Every day | ORAL | 5 refills | Status: DC

## 2016-08-05 NOTE — Progress Notes (Signed)
Reorder the 100 mg allopurinol, take with 300 mg daily to equal 400 mg Labs reviewed

## 2016-08-11 ENCOUNTER — Ambulatory Visit: Payer: Self-pay | Admitting: Skilled Nursing Facility1

## 2016-09-13 ENCOUNTER — Encounter: Payer: 59 | Admitting: Family Medicine

## 2016-09-14 ENCOUNTER — Encounter: Payer: 59 | Admitting: Family Medicine

## 2016-10-08 DIAGNOSIS — G4730 Sleep apnea, unspecified: Secondary | ICD-10-CM | POA: Diagnosis not present

## 2016-10-26 ENCOUNTER — Encounter: Payer: Self-pay | Admitting: Family Medicine

## 2016-10-26 ENCOUNTER — Ambulatory Visit (INDEPENDENT_AMBULATORY_CARE_PROVIDER_SITE_OTHER): Payer: 59 | Admitting: Family Medicine

## 2016-10-26 DIAGNOSIS — Z9884 Bariatric surgery status: Secondary | ICD-10-CM

## 2016-10-26 DIAGNOSIS — M1A09X0 Idiopathic chronic gout, multiple sites, without tophus (tophi): Secondary | ICD-10-CM | POA: Diagnosis not present

## 2016-10-26 DIAGNOSIS — R7303 Prediabetes: Secondary | ICD-10-CM

## 2016-10-26 DIAGNOSIS — Z0000 Encounter for general adult medical examination without abnormal findings: Secondary | ICD-10-CM | POA: Diagnosis not present

## 2016-10-26 DIAGNOSIS — Z23 Encounter for immunization: Secondary | ICD-10-CM

## 2016-10-26 NOTE — Assessment & Plan Note (Signed)
Assume that is resolving after bariatric surgery

## 2016-10-26 NOTE — Assessment & Plan Note (Signed)
Tetanus booster offered and given today 

## 2016-10-26 NOTE — Assessment & Plan Note (Signed)
USPSTF grade A and B recommendations reviewed with patient; age-appropriate recommendations, preventive care, screening tests, etc discussed and encouraged; healthy living encouraged; see AVS for patient education given to patient  

## 2016-10-26 NOTE — Assessment & Plan Note (Signed)
Minor right now

## 2016-10-26 NOTE — Patient Instructions (Addendum)
You received the vaccine to protect against tetanus and diphtheria and pertussis today; the tetanus and diphtheria portions will provide protection up to ten years, and the pertussis component will give you protection against whooping cough for life If you need something for aches or pains, try to use Tylenol (acetaminophen) instead of non-steroidals (which include Aleve, ibuprofen, Advil, Motrin, and naproxen); non-steroidals can cause long-term kidney damage and is NOT recommended for folks after bariatric surgery Try tolnaftate for the spot on the leg, may take up to 4 weeks to clear   Health Maintenance, Male A healthy lifestyle and preventive care is important for your health and wellness. Ask your health care provider about what schedule of regular examinations is right for you. What should I know about weight and diet? Eat a Healthy Diet  Eat plenty of vegetables, fruits, whole grains, low-fat dairy products, and lean protein.  Do not eat a lot of foods high in solid fats, added sugars, or salt.  Maintain a Healthy Weight Regular exercise can help you achieve or maintain a healthy weight. You should:  Do at least 150 minutes of exercise each week. The exercise should increase your heart rate and make you sweat (moderate-intensity exercise).  Do strength-training exercises at least twice a week.  Watch Your Levels of Cholesterol and Blood Lipids  Have your blood tested for lipids and cholesterol every 5 years starting at 35 years of age. If you are at high risk for heart disease, you should start having your blood tested when you are 35 years old. You may need to have your cholesterol levels checked more often if: ? Your lipid or cholesterol levels are high. ? You are older than 35 years of age. ? You are at high risk for heart disease.  What should I know about cancer screening? Many types of cancers can be detected early and may often be prevented. Lung Cancer  You should be  screened every year for lung cancer if: ? You are a current smoker who has smoked for at least 30 years. ? You are a former smoker who has quit within the past 15 years.  Talk to your health care provider about your screening options, when you should start screening, and how often you should be screened.  Colorectal Cancer  Routine colorectal cancer screening usually begins at 35 years of age and should be repeated every 5-10 years until you are 35 years old. You may need to be screened more often if early forms of precancerous polyps or small growths are found. Your health care provider may recommend screening at an earlier age if you have risk factors for colon cancer.  Your health care provider may recommend using home test kits to check for hidden blood in the stool.  A small camera at the end of a tube can be used to examine your colon (sigmoidoscopy or colonoscopy). This checks for the earliest forms of colorectal cancer.  Prostate and Testicular Cancer  Depending on your age and overall health, your health care provider may do certain tests to screen for prostate and testicular cancer.  Talk to your health care provider about any symptoms or concerns you have about testicular or prostate cancer.  Skin Cancer  Check your skin from head to toe regularly.  Tell your health care provider about any new moles or changes in moles, especially if: ? There is a change in a mole's size, shape, or color. ? You have a mole that is larger  than a pencil eraser.  Always use sunscreen. Apply sunscreen liberally and repeat throughout the day.  Protect yourself by wearing long sleeves, pants, a wide-brimmed hat, and sunglasses when outside.  What should I know about heart disease, diabetes, and high blood pressure?  If you are 2218-35 years of age, have your blood pressure checked every 3-5 years. If you are 35 years of age or older, have your blood pressure checked every year. You should have  your blood pressure measured twice-once when you are at a hospital or clinic, and once when you are not at a hospital or clinic. Record the average of the two measurements. To check your blood pressure when you are not at a hospital or clinic, you can use: ? An automated blood pressure machine at a pharmacy. ? A home blood pressure monitor.  Talk to your health care provider about your target blood pressure.  If you are between 4945-35 years old, ask your health care provider if you should take aspirin to prevent heart disease.  Have regular diabetes screenings by checking your fasting blood sugar level. ? If you are at a normal weight and have a low risk for diabetes, have this test once every three years after the age of 35. ? If you are overweight and have a high risk for diabetes, consider being tested at a younger age or more often.  A one-time screening for abdominal aortic aneurysm (AAA) by ultrasound is recommended for men aged 65-75 years who are current or former smokers. What should I know about preventing infection? Hepatitis B If you have a higher risk for hepatitis B, you should be screened for this virus. Talk with your health care provider to find out if you are at risk for hepatitis B infection. Hepatitis C Blood testing is recommended for:  Everyone born from [birth year] through 1965.  Anyone with known risk factors for hepatitis C.  Sexually Transmitted Diseases (STDs)  You should be screened each year for STDs including gonorrhea and chlamydia if: ? You are sexually active and are younger than 35 years of age. ? You are older than 35 years of age and your health care provider tells you that you are at risk for this type of infection. ? Your sexual activity has changed since you were last screened and you are at an increased risk for chlamydia or gonorrhea. Ask your health care provider if you are at risk.  Talk with your health care provider about whether you are at high risk  of being infected with HIV. Your health care provider may recommend a prescription medicine to help prevent HIV infection.  What else can I do?  Schedule regular health, dental, and eye exams.  Stay current with your vaccines (immunizations).  Do not use any tobacco products, such as cigarettes, chewing tobacco, and e-cigarettes. If you need help quitting, ask your health care provider.  Limit alcohol intake to no more than 2 drinks per day. One drink equals 12 ounces of beer, 5 ounces of wine, or 1 ounces of hard liquor.  Do not use street drugs.  Do not share needles.  Ask your health care provider for help if you need support or information about quitting drugs.  Tell your health care provider if you often feel depressed.  Tell your health care provider if you have ever been abused or do not feel safe at home. This information is not intended to replace advice given to you by your health care provider.  Make sure you discuss any questions you have with your health care provider. Document Released: 10/01/2007 Document Revised: 12/02/2015 Document Reviewed: 01/06/2015 Elsevier Interactive Patient Education  [employer].

## 2016-10-26 NOTE — Progress Notes (Signed)
BP 122/84   Pulse 78   Temp 98.2 F (36.8 C) (Oral)   Resp 14   Ht 6\' 4"  (1.93 m)   Wt 224 lb 11.2 oz (101.9 kg)   SpO2 99%   BMI 27.35 kg/m    Subjective:    Patient ID: Donald [name], male    DOB: 11/27/1980, 36 y.o.   MRN: [id number]  HPI: Donald [name] is a 36 y.o. male  Chief Complaint  Patient presents with  . Annual Exam    HPI  USPSTF grade A and B recommendations Depression:  Depression screen [facility]/9 10/26/2016 05/19/2016 04/06/2016 03/08/2016 02/09/2016  Decreased Interest 0 0 0 0 0  Down, Depressed, Hopeless 0 0 0 0 0  PHQ - 2 Score 0 0 0 0 0   Hypertension: BP Readings from Last 3 Encounters:  10/26/16 122/84  05/19/16 118/74  03/22/16 (!) 119/54   Obesity: he has lost significant weight after his bariatric surgery; he says surgeon was surprised at how fast he was losing, monitoring him, going back to see him soon and have more labs done Wt Readings from Last 3 Encounters:  10/26/16 224 lb 11.2 oz (101.9 kg)  05/19/16 286 lb (129.7 kg)  05/19/16 281 lb 3.2 oz (127.6 kg)   BMI Readings from Last 3 Encounters:  10/26/16 27.35 kg/m  05/19/16 34.81 kg/m  05/19/16 34.23 kg/m   had a sleep study two weeks, OSA is gone now after the weight loss Alcohol: social drinker Tobacco use: no HIV, hep B, hep C: just had it, declined Married STD testing and prevention (chl/gon/syphilis): declined Lipids:  Lab Results  Component Value Date   CHOL 217 (H) 02/01/2016   CHOL 176 09/10/2015   CHOL 167 02/20/2015   Lab Results  Component Value Date   HDL 48 02/01/2016   HDL 52 09/10/2015   HDL 40 02/20/2015   Lab Results  Component Value Date   LDLCALC 105 02/01/2016   LDLCALC 104 (H) 09/10/2015   LDLCALC 102 (H) 02/20/2015   Lab Results  Component Value Date   TRIG 318 (H) 02/01/2016   TRIG 99 09/10/2015   TRIG 127 02/20/2015   Lab Results  Component Value Date   CHOLHDL 4.5 02/01/2016   CHOLHDL 4.2 02/20/2015   No results found  for: LDLDIRECT Glucose:  Glucose  Date Value Ref Range Status  08/03/2016 78 65 - 99 mg/dL Final  40/98/119102/04/2016 81 65 - 99 mg/dL Final  47/82/956201/01/2017 83 65 - 99 mg/dL Final   Glucose, Bld  Date Value Ref Range Status  03/22/2016 115 (H) 65 - 99 mg/dL Final  13/08/657811/27/2017 88 65 - 99 mg/dL Final  46/96/295210/16/2017 92 65 - 99 mg/dL Final   Glucose-Capillary  Date Value Ref Range Status  03/21/2016 159 (H) 65 - 99 mg/dL Final   Colorectal cancer: no fam hx Prostate cancer: family hx father, grandfather, paternal cousin; no sx No results found for: PSA Lung cancer:  nonsmoker AAA: n/a Aspirin: not taking Diet: post-surgical diet, 3 ounces to 4 ounces at a time Exercise: more active Skin cancer: has a spot on the scrotum that he wants checked; there is not symptom with it; one week duration; no worrisome moles  Depression screen [facility] 2/9 10/26/2016 05/19/2016 04/06/2016 03/08/2016 02/09/2016  Decreased Interest 0 0 0 0 0  Down, Depressed, Hopeless 0 0 0 0 0  PHQ - 2 Score 0 0 0 0 0    Relevant past medical, surgical,  family and social history reviewed Past Medical History:  Diagnosis Date  . Benign essential HTN   . Family hx of prostate cancer 09/10/2015  . Hyperlipidemia LDL goal <100   . Hypertension   . Pre-diabetes   . Situational anxiety    Past Surgical History:  Procedure Laterality Date  . LAPAROSCOPIC GASTRIC SLEEVE RESECTION N/A 03/21/2016   Procedure: LAPAROSCOPIC GASTRIC SLEEVE RESECTION, UPPER ENDO;  Surgeon: Gaynelle Adu, MD;  Location: [facility];  Service: General;  Laterality: N/A;  . WISDOM TOOTH EXTRACTION     Family History  Problem Relation Age of Onset  . Hyperlipidemia Mother   . Diabetes Mother   . Diabetes Father   . Prostate cancer Father   . Prostate cancer Paternal Grandfather    Social History   Social History  . Marital status: Married    Spouse name: N/A  . Number of children: N/A  . Years of education: N/A   Occupational History  . Not on file.    Social History Main Topics  . Smoking status: Former Smoker    Types: Cigarettes  . Smokeless tobacco: Never Used     Comment: smokes at social events rarely  . Alcohol use 0.0 - 0.6 oz/week     Comment: socially  . Drug use: No  . Sexual activity: Yes   Other Topics Concern  . Not on file   Social History Narrative  . No narrative on file    Interim medical history since last visit reviewed. Allergies and medications reviewed  Review of Systems Per HPI unless specifically indicated above     Objective:    BP 122/84   Pulse 78   Temp 98.2 F (36.8 C) (Oral)   Resp 14   Ht 6\' 4"  (1.93 m)   Wt 224 lb 11.2 oz (101.9 kg)   SpO2 99%   BMI 27.35 kg/m   Wt Readings from Last 3 Encounters:  10/26/16 224 lb 11.2 oz (101.9 kg)  05/19/16 286 lb (129.7 kg)  05/19/16 281 lb 3.2 oz (127.6 kg)    Physical Exam  Constitutional: He appears well-developed and well-nourished. No distress.  Weight loss of 62 pounds over last 5+ months (following bariatric surgery)  HENT:  Head: Normocephalic and atraumatic.  Nose: Nose normal.  Mouth/Throat: Oropharynx is clear and moist.  Eyes: EOM are normal. No scleral icterus.  Neck: No JVD present. No thyromegaly present.  Cardiovascular: Normal rate, regular rhythm and normal heart sounds.   Pulmonary/Chest: Effort normal and breath sounds normal. No respiratory distress. He has no wheezes. He has no rales.  Abdominal: Soft. Bowel sounds are normal. He exhibits no distension. There is no tenderness. There is no guarding.  Musculoskeletal: Normal range of motion. He exhibits no edema.  Lymphadenopathy:    He has no cervical adenopathy.  Neurological: He is alert. He displays normal reflexes. He exhibits normal muscle tone. Coordination normal.  Skin: Skin is warm and dry. No rash noted. He is not diaphoretic. No erythema. No pallor.  Psychiatric: He has a normal mood and affect. His behavior is normal. Judgment and thought content  normal.   Results for orders placed or performed in visit on 06/01/16  Basic Metabolic Panel (BMET)  Result Value Ref Range   Glucose 78 65 - 99 mg/dL   BUN 11 6 - 20 mg/dL   Creatinine, Ser 7.82 0.76 - 1.27 mg/dL   GFR calc non Af Amer 88 >59 mL/min/1.73   GFR calc Af  Amer 102 >59 mL/min/1.73   BUN/Creatinine Ratio 10 9 - 20   Sodium 139 134 - 144 mmol/L   Potassium 4.4 3.5 - 5.2 mmol/L   Chloride 99 96 - 106 mmol/L   CO2 27 18 - 29 mmol/L   Calcium 9.9 8.7 - 10.2 mg/dL  Uric acid  Result Value Ref Range   Uric Acid 5.2 3.7 - 8.6 mg/dL      Assessment & Plan:   Problem List Items Addressed This Visit      Musculoskeletal and Integument   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 tophus    Minor right now        Other   S/P laparoscopic sleeve gastrectomy    Continue vitamins; avoid NSAIDs      Preventative health care    USPSTF grade A and B recommendations reviewed with patient; age-appropriate recommendations, preventive care, screening tests, etc discussed and encouraged; healthy living encouraged; see AVS for patient education given to patient       Pre-diabetes (Chronic)    Assume that is resolving after bariatric surgery      Need for tetanus booster    Tetanus booster offered and given today      Relevant Orders   Tdap vaccine greater than or equal to 7yo IM (Completed)       Follow up plan: Return in about 1 year (around 10/26/2017) for complete physical.  An after-visit summary was printed and given to the patient at check-out.  Please see the patient instructions which may contain other information and recommendations beyond what is mentioned above in the assessment and plan.  No orders of the defined types were placed in this encounter.   Orders Placed This Encounter  Procedures  . Tdap vaccine greater than or equal to 7yo IM

## 2016-10-26 NOTE — Assessment & Plan Note (Signed)
Continue vitamins; avoid NSAIDs

## 2016-11-03 ENCOUNTER — Telehealth: Payer: Self-pay | Admitting: Family Medicine

## 2016-11-03 NOTE — Telephone Encounter (Signed)
Received paperwork in pink folder I have placed on Dr. Marlise EvesLada's desk file for review.

## 2016-11-03 NOTE — Telephone Encounter (Signed)
PT HAS DROPPED OFF FMLA PAPERWORK THAT HE SAYS THAT HE TOLD THE DR ABOUT IN A PREVIOUS VISIT PER PT.. AM PLACING IT IN THE DR FOLDER TO BE GIVEN TO THE NURSE. IT HAS A PURPLE SHEET ATTACHED.

## 2016-11-06 IMAGING — CR DG WRIST COMPLETE 3+V*L*
1 series · 4 of 4 positions shown · non-contrast
Comparison: None.

CLINICAL DATA: Left wrist pain for 2 weeks.  No known injury.

EXAM:
LEFT WRIST - COMPLETE 3+ VIEW

[Series 1: dg wrist complete left · 0.14mm/px · 4 of 4 slices shown]
[im 1/4]
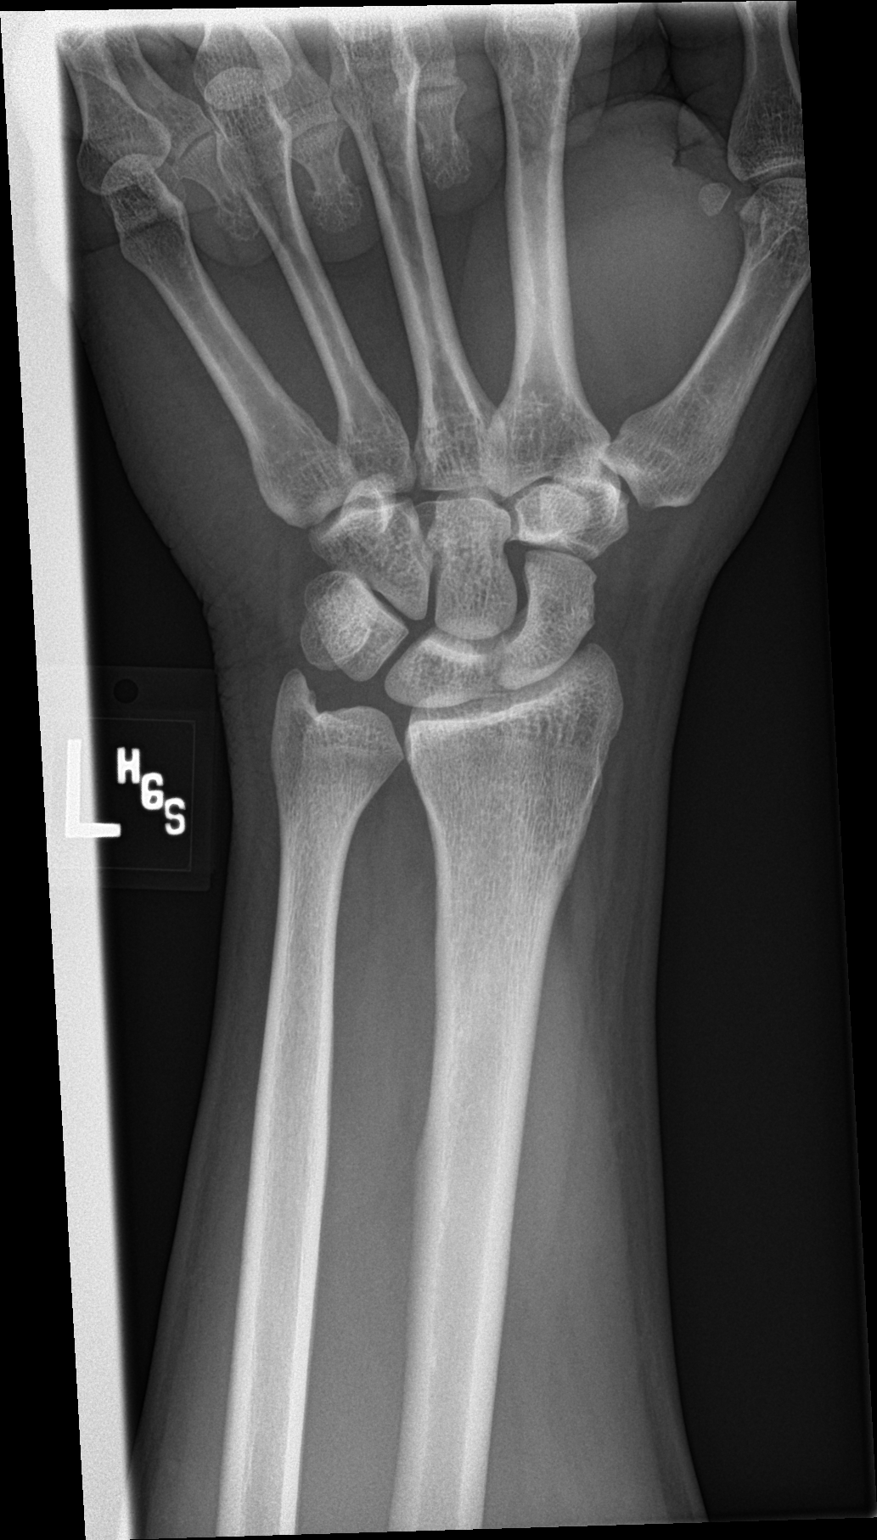
[im 2/4]
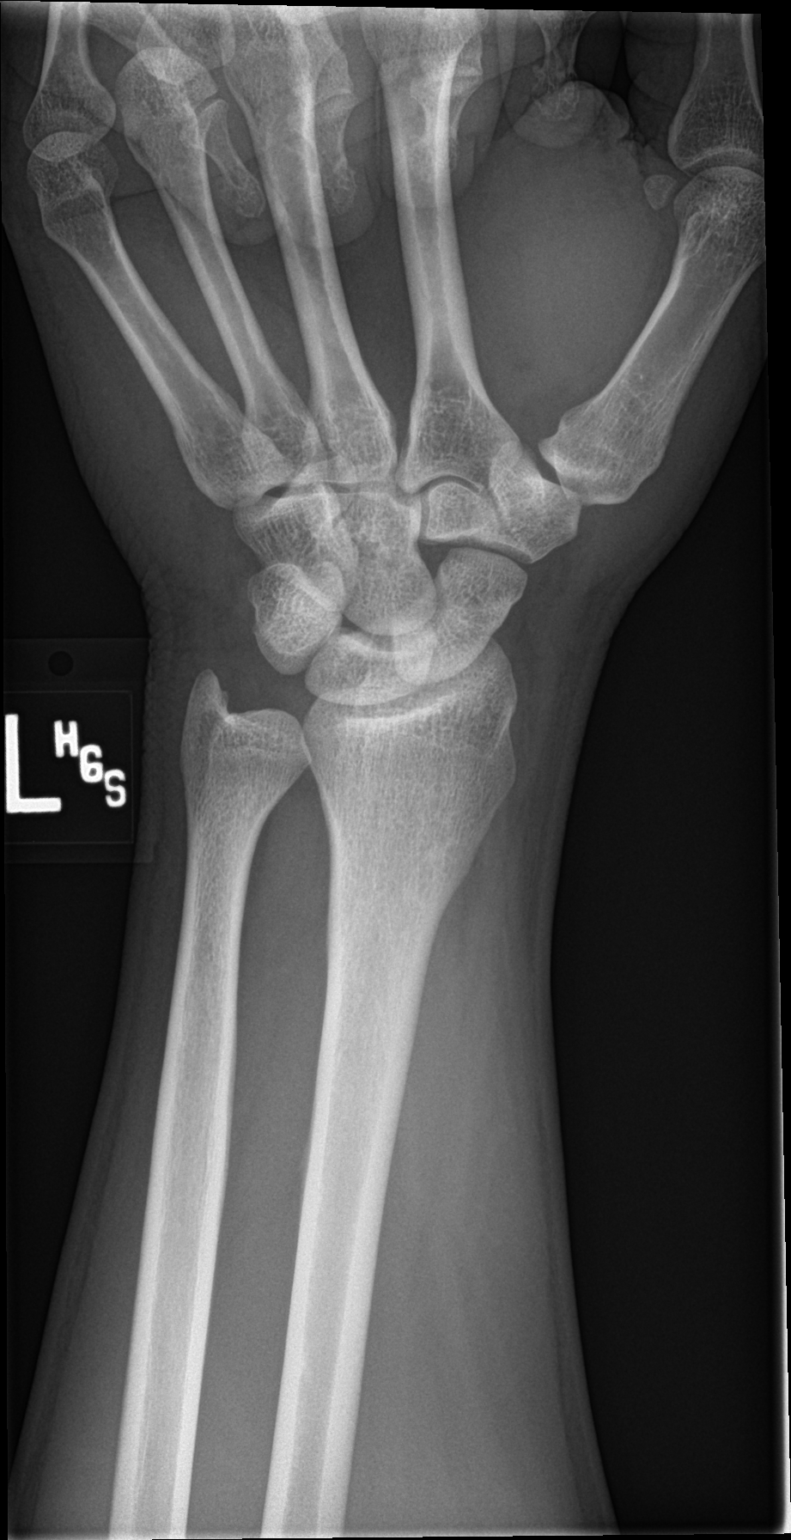
[im 3/4]
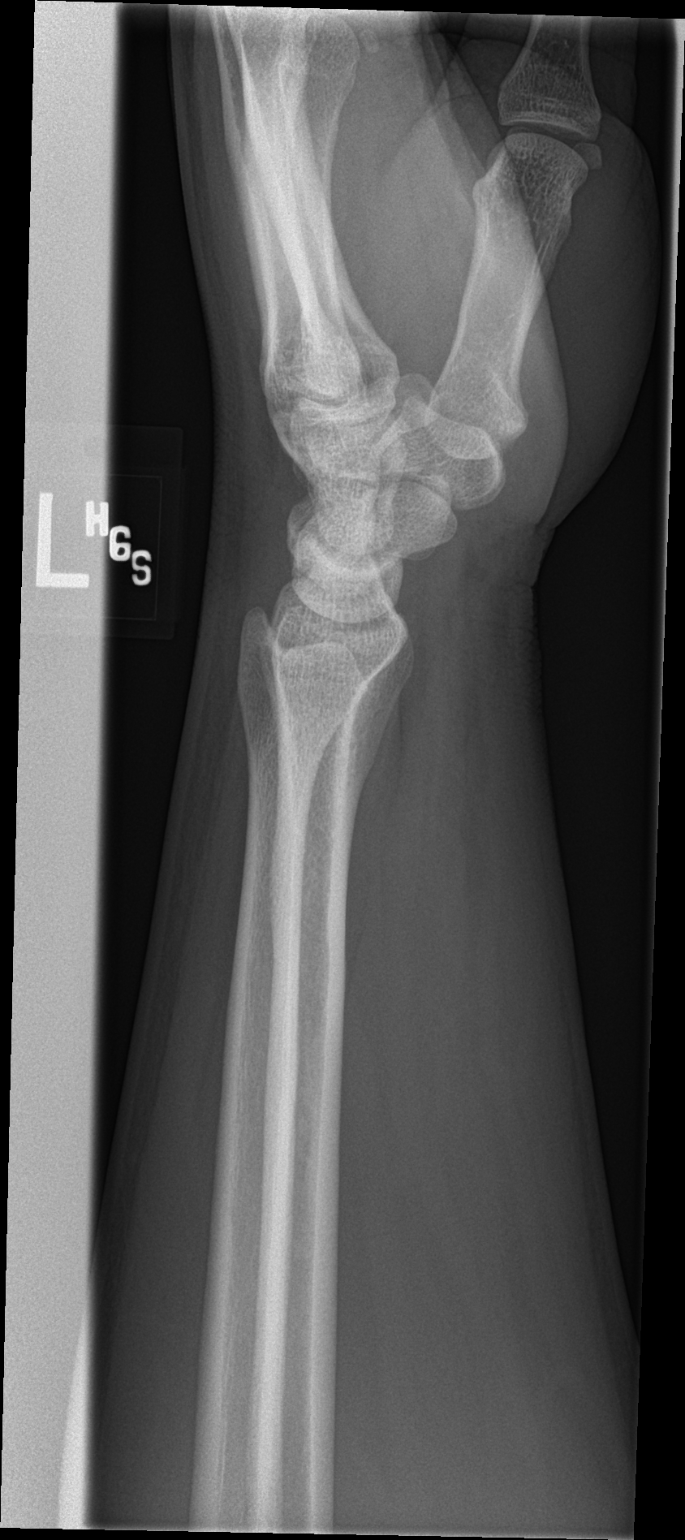
[im 4/4]
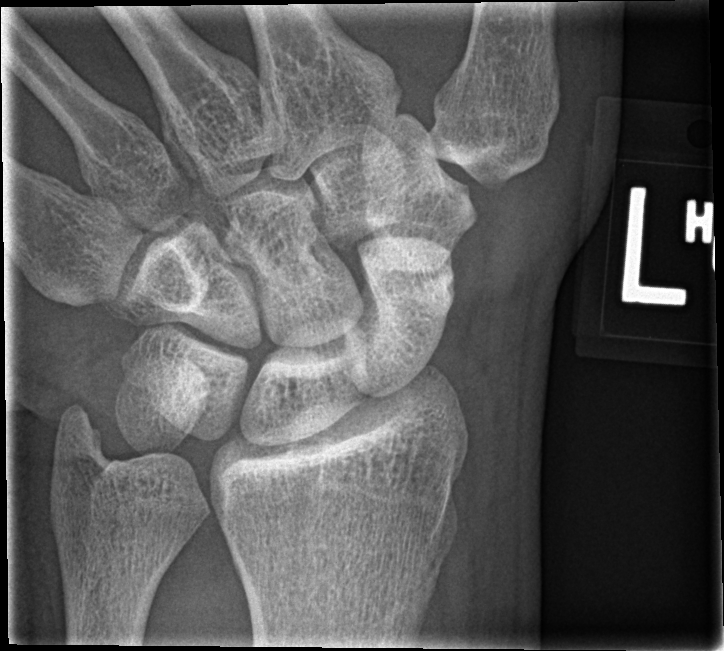

[4 of 4 positions shown; findings below may reference images not displayed]

FINDINGS: No acute bony or joint abnormality identified. No evidence of
fracture dislocation.
IMPRESSION: No acute abnormality.

## 2016-11-06 IMAGING — CR DG FOOT COMPLETE 3+V*L*
1 series · 3 of 3 positions shown · non-contrast
Comparison: 09/05/2013.

CLINICAL DATA: Pain.  Initial evaluation.

EXAM:
LEFT FOOT - COMPLETE 3+ VIEW

[Series 1: view not recorded · 0.14mm/px · 3 of 3 slices shown]
[im 1/3]
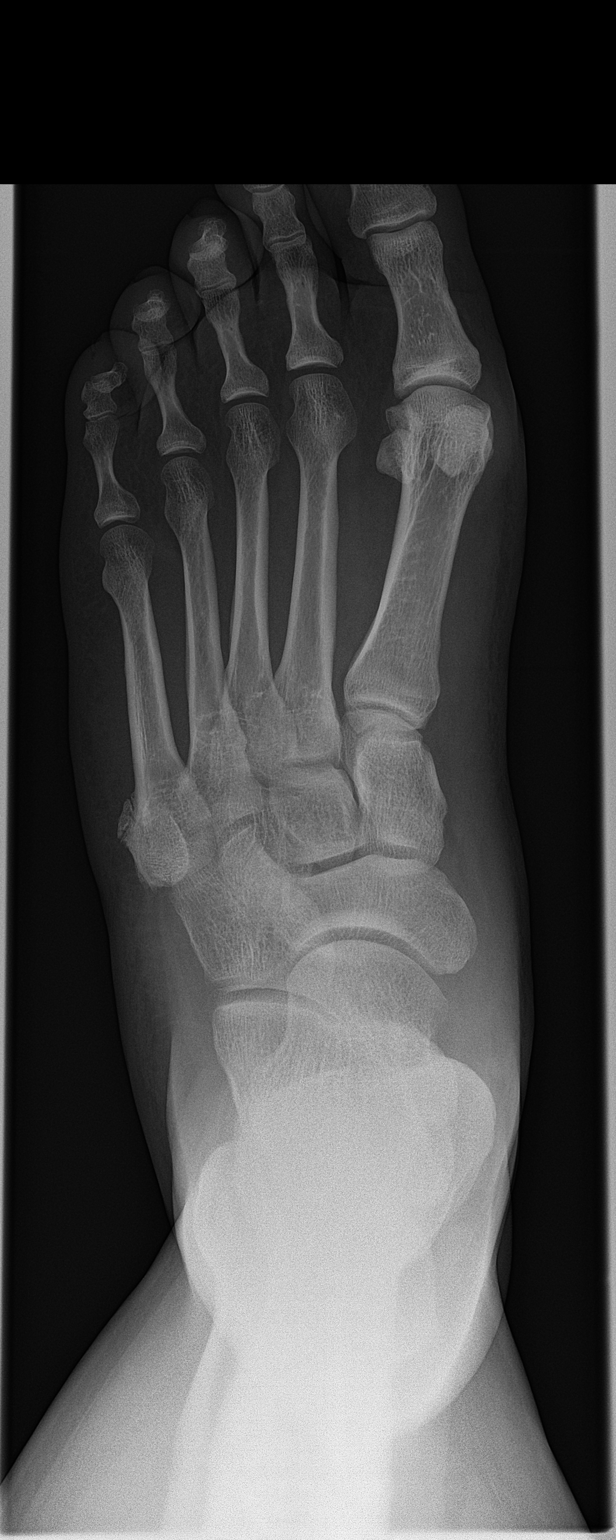
[im 2/3]
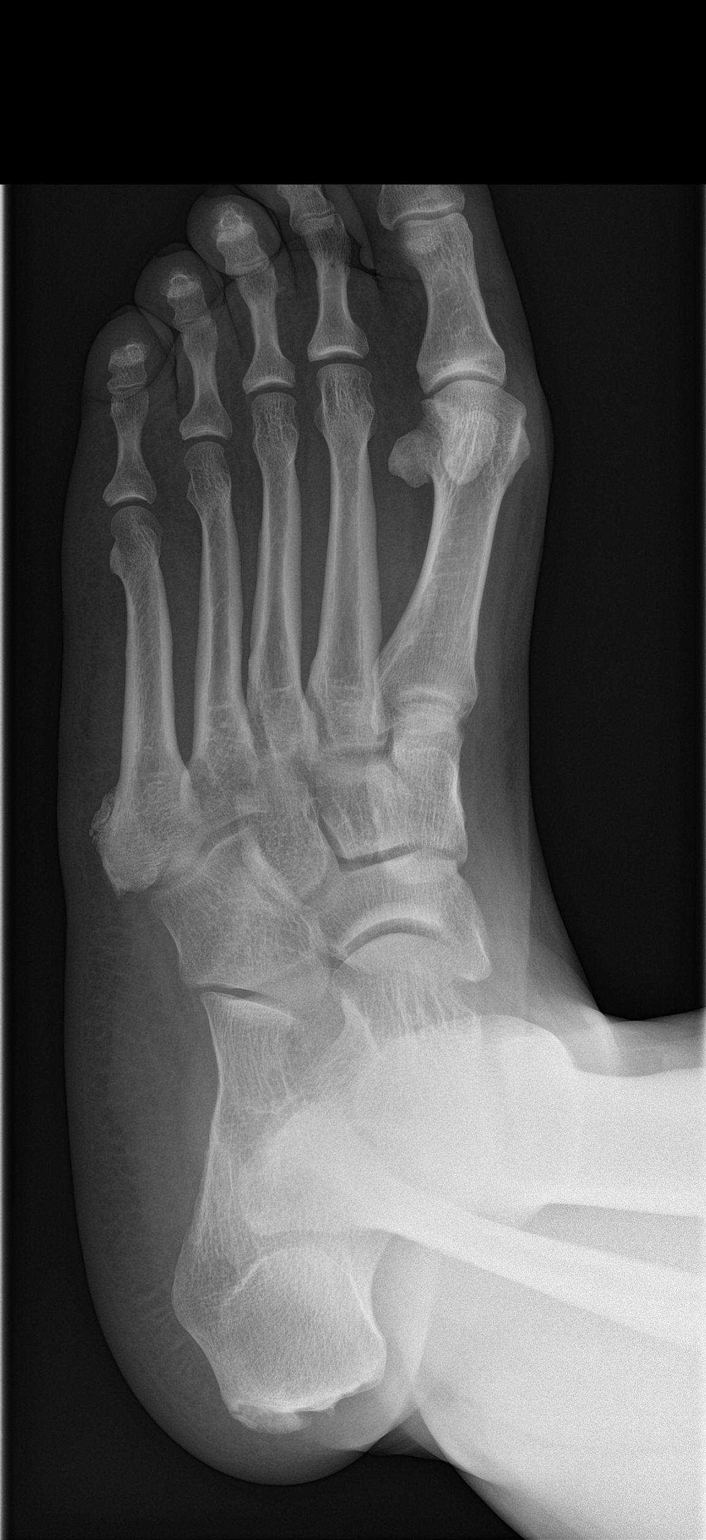
[im 3/3]
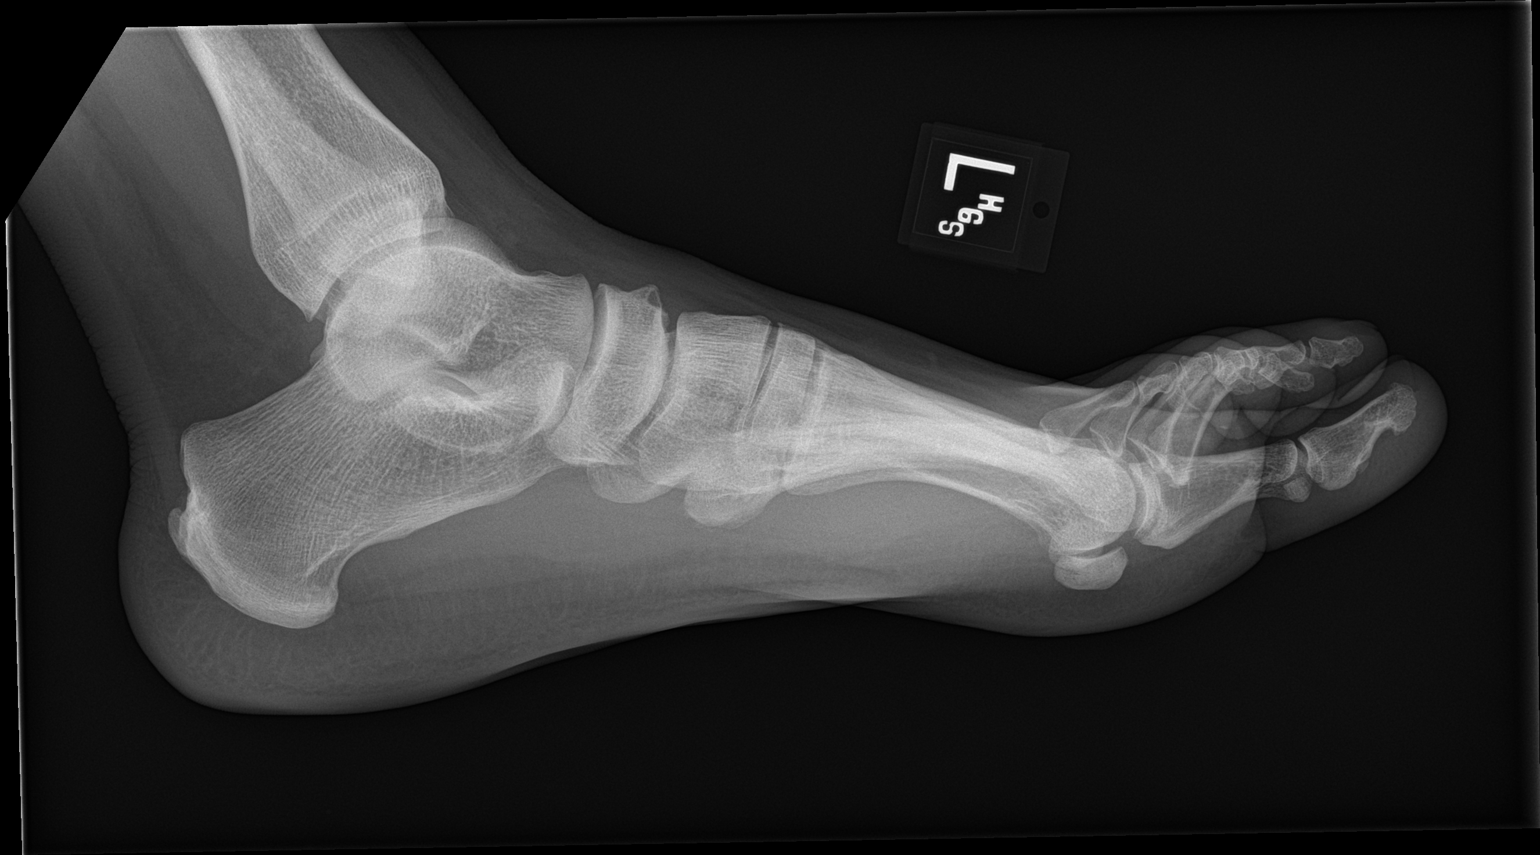

[3 of 3 positions shown; findings below may reference images not displayed]

FINDINGS: Deformity noted the base of the left fifth metatarsal consistent
with old fracture. Fracture in this region is noted on prior study
09/05/2013. An acute nondisplaced fracture component cannot be
excluded. No other focal abnormality identified. No radiopaque
foreign bodies.
IMPRESSION: 1. Old fracture base of the left fifth metatarsal. Superimposed
acute fracture cannot be excluded.
2. No other focal abnormality .

## 2016-11-09 ENCOUNTER — Telehealth: Payer: Self-pay | Admitting: Family Medicine

## 2016-11-09 NOTE — Telephone Encounter (Signed)
Pt checking status on his FMLA paperwork that he dropped off last week. States that this form is time sensitive. Please return call [phone]

## 2016-11-09 NOTE — Telephone Encounter (Signed)
I just finished his paperwork; I apologize for the delay

## 2016-11-11 DIAGNOSIS — K912 Postsurgical malabsorption, not elsewhere classified: Secondary | ICD-10-CM | POA: Diagnosis not present

## 2016-11-11 DIAGNOSIS — R7303 Prediabetes: Secondary | ICD-10-CM | POA: Diagnosis not present

## 2016-11-11 DIAGNOSIS — Z9884 Bariatric surgery status: Secondary | ICD-10-CM | POA: Diagnosis not present

## 2016-11-11 DIAGNOSIS — E559 Vitamin D deficiency, unspecified: Secondary | ICD-10-CM | POA: Diagnosis not present

## 2016-11-29 ENCOUNTER — Encounter: Payer: Self-pay | Admitting: Skilled Nursing Facility1

## 2016-11-29 ENCOUNTER — Encounter: Payer: 59 | Attending: General Surgery | Admitting: Skilled Nursing Facility1

## 2016-11-29 DIAGNOSIS — E559 Vitamin D deficiency, unspecified: Secondary | ICD-10-CM | POA: Diagnosis not present

## 2016-11-29 DIAGNOSIS — Z713 Dietary counseling and surveillance: Secondary | ICD-10-CM | POA: Insufficient documentation

## 2016-11-29 DIAGNOSIS — R7303 Prediabetes: Secondary | ICD-10-CM | POA: Insufficient documentation

## 2016-11-29 DIAGNOSIS — K912 Postsurgical malabsorption, not elsewhere classified: Secondary | ICD-10-CM | POA: Diagnosis not present

## 2016-11-29 DIAGNOSIS — Z6827 Body mass index (BMI) 27.0-27.9, adult: Secondary | ICD-10-CM | POA: Diagnosis not present

## 2016-11-29 DIAGNOSIS — Z9884 Bariatric surgery status: Secondary | ICD-10-CM | POA: Diagnosis present

## 2016-11-29 DIAGNOSIS — E669 Obesity, unspecified: Secondary | ICD-10-CM

## 2016-11-29 NOTE — Patient Instructions (Addendum)
-  Start taking your multivitamin again-get the capsule  -Start the calcium back up-use your tums  -Add in a couple days of walking for 30 minutes   -Eat a piece of fruit when you get to work  -Drink during your workout and then after your workout have another piece of fruit  -Choose whatever you want from [employer]   -Add a full variety of foods back into your diet  -Continue to listen to your body

## 2016-11-29 NOTE — Progress Notes (Signed)
  Follow-up visit: 8 Weeks Post-Operative Sleeve Gastrectomy Surgery  Medical Nutrition Therapy:  Appt start time: 9:00 end time:  9:45  Primary concerns today: Post-operative Bariatric Surgery Nutrition Management.    Pt states his energy level is good and he no longer has sleep apnea. Pt state she is getting reflux with a smoothie and sometimes with red sauce. Pt arrives with sunken temples. Pt is only eating proteins at this time.     Surgery date: 03/21/2016 Surgery type: sleeve gastrectomy [facility] lbs on 10/08/2015, 366 lbs on 03/07/16 Weight today: 220.2 lbs Weight change: 61 lbs  TANITA  BODY COMP RESULTS  03/07/16 04/05/16 05/19/2016 11/29/2016   BMI (kg/m^2) 43.4 37.7 34.2 26.9   Fat Mass (lbs) 160.6 118.6 93.4 47   Fat Free Mass (lbs) 205.4 199.2 187.8 173.8   Total Body Water (lbs) 158.4 143.8 131.6 115.6    Preferred Learning Style:   No preference indicated   Learning Readiness:   Change in progress  24-hr recall: B (AM): protein shake (20g) (just powder and water-likes it best that way) Snk (AM): inside of breakfast burrito or chicken wings(10 grams protein) L (PM): protein shake (20g) Snk (PM): 1 taco D (PM): pork rinds (10g) Snk (PM): if workout another shake or meatloaf or tacos or wings   Fluid intake: water, protein shakes: 64-128 Estimated total protein intake: 115 grams protein   Medications: See List Supplementation: Multivitamin and Calcium   Using straws: NO Drinking while eating: NO Hair loss: NO Carbonated beverages: NO N/V/D/C: NO  Recent physical activity:  GYM 4 days a week 1.5 to 2 hours minutes: wts   Progress Towards Goal(s):  In progress.  Handouts given during visit include:  NS vegetables with protein   Nutritional Diagnosis:  [place]-3.3 Overweight/obesity related to past poor dietary habits and physical inactivity as evidenced by patient w/ recent sleeve gastrectomy surgery following dietary guidelines for  continued weight loss.    Intervention:  Nutrition counseling. Dietitian educated the pt on advancing his diet to including vegetables and fruit. Goals: -Start taking your multivitamin again-get the capsule -Start the calcium back up-use your tums -Add in a couple days of walking for 30 minutes  -Eat a piece of fruit when you get to work -Drink during your workout and then after your workout have another piece of fruit -Choose whatever you want from [employer]  -Add a full variety of foods back into your diet -Continue to listen to your body  Teaching Method Utilized: Visual Auditory Hands on  Barriers to learning/adherence to lifestyle change: none stated  Demonstrated degree of understanding via:  Teach Back   Monitoring/Evaluation:  Dietary intake, exercise, and body weight.

## 2016-12-15 ENCOUNTER — Encounter: Payer: Self-pay | Admitting: Family Medicine

## 2017-02-27 ENCOUNTER — Ambulatory Visit: Payer: 59 | Admitting: Family Medicine

## 2017-02-27 ENCOUNTER — Encounter: Payer: Self-pay | Admitting: Family Medicine

## 2017-02-27 VITALS — BP 116/68 | HR 88 | Temp 98.2°F | Resp 16 | Wt 221.9 lb

## 2017-02-27 DIAGNOSIS — E781 Pure hyperglyceridemia: Secondary | ICD-10-CM

## 2017-02-27 DIAGNOSIS — Z23 Encounter for immunization: Secondary | ICD-10-CM | POA: Diagnosis not present

## 2017-02-27 DIAGNOSIS — R7303 Prediabetes: Secondary | ICD-10-CM | POA: Diagnosis not present

## 2017-02-27 DIAGNOSIS — M1A09X0 Idiopathic chronic gout, multiple sites, without tophus (tophi): Secondary | ICD-10-CM | POA: Diagnosis not present

## 2017-02-27 MED ORDER — ALLOPURINOL 100 MG PO TABS: 100.0000 mg | ORAL_TABLET | Freq: Every day | ORAL | 2 refills | Status: DC

## 2017-02-27 MED ORDER — ALLOPURINOL 300 MG PO TABS: 300.0000 mg | ORAL_TABLET | Freq: Every day | ORAL | 2 refills | Status: DC

## 2017-02-27 NOTE — Assessment & Plan Note (Signed)
Resolved after bariatric surgery

## 2017-02-27 NOTE — Patient Instructions (Signed)
Return in 6 months Try to limit Malaw[place]urkey and gravy during the holidays Look into tart cherry juice

## 2017-02-27 NOTE — Progress Notes (Signed)
BP 116/68   Pulse 88   Temp 98.2 F (36.8 C) (Oral)   Resp 16   Wt 221 lb 14.4 oz (100.7 kg)   SpO2 93%   BMI 27.01 kg/m    Subjective:    Patient ID: Donald [name], male    DOB: 11/05/1980, 36 y.o.   MRN: [id number]  HPI: Donald [name] is a 36 y.o. male  Chief Complaint  Patient presents with  . Medication Refill  . [facility] paperwork   HPI Patient is here for f/u Last visit was October 26, 2016  Hx of prediabetes Lab Results  Component Value Date   HGBA1C 6.3 (H) 03/14/2016  his last A1c at the kidney doctor was 5.4 in July He had lipids done then too, reviewed  High blood pressure; controlled today off meds now that he has lost significant weight  He has a hx of gout; currently on 400 mg of allopurinol daily Last gout flare was one week; before that was a few months ago; sometimes back to back a few weeks apart; they get strict on missing days; might have 4-6 flares a year Each flare might be 2-3 days; has been up to a week just occasionally The flares are in the 1st MTP of the LEFT foot; they will come up quickly He has not tried tart cherry juice  He has hx of GERD; not having to use PPI any more  He has had bariatric surgery; on calcium chews and multiple vitamin daily; had malnutrition after weight loss surgery and saw kidney doctor; weight stabilized, doing well; goes back to bariatric surgeon next month  Flu shot today  [facility] 2/9 02/27/2017 10/26/2016 05/19/2016 04/06/2016 03/08/2016  Decreased Interest 0 0 0 0 0  Down, Depressed, Hopeless 0 0 0 0 0  PHQ - 2 Score 0 0 0 0 0   Relevant past medical, surgical, family and social history reviewed Past Medical History:  Diagnosis Date  . Benign essential HTN   . Family hx of prostate cancer 09/10/2015  . History of sleeve gastrectomy 03/21/2016  . Hyperlipidemia LDL goal <100   . Hypertension   . Pre-diabetes   . Situational anxiety    Past Surgical History:  Procedure Laterality  Date  . WISDOM TOOTH EXTRACTION     Family History  Problem Relation Age of Onset  . Hyperlipidemia Mother   . Diabetes Mother   . Diabetes Father   . Prostate cancer Father   . Prostate cancer Paternal Grandfather    Social History   Tobacco Use  . Smoking status: Former Smoker    Types: Cigarettes  . Smokeless tobacco: Never Used  . Tobacco comment: smokes at social events rarely  Substance Use Topics  . Alcohol use: Yes    Alcohol/week: 0.0 - 0.6 oz    Comment: socially  . Drug use: No   Interim medical history since last visit reviewed. Allergies and medications reviewed  Review of Systems Per HPI unless specifically indicated above     Objective:    BP 116/68   Pulse 88   Temp 98.2 F (36.8 C) (Oral)   Resp 16   Wt 221 lb 14.4 oz (100.7 kg)   SpO2 93%   BMI 27.01 kg/m   Wt Readings from Last 3 Encounters:  02/27/17 221 lb 14.4 oz (100.7 kg)  11/29/16 220 lb 12.8 oz (100.2 kg)  10/26/16 224 lb 11.2 oz (101.9 kg)    Physical Exam  Constitutional: He appears well-developed and well-nourished. No distress.  Weight stablized  HENT:  Head: Normocephalic and atraumatic.  Eyes: EOM are normal. No scleral icterus.  Neck: No thyromegaly present.  Cardiovascular: Normal rate and regular rhythm.  Pulmonary/Chest: Effort normal and breath sounds normal.  Abdominal: Soft. Bowel sounds are normal. He exhibits no distension.  Musculoskeletal: He exhibits no edema.  Neurological: Coordination normal.  Skin: Skin is warm and dry. No pallor.  Psychiatric: He has a normal mood and affect. His behavior is normal. Judgment and thought content normal.      Assessment & Plan:   Problem List Items Addressed This Visit      Musculoskeletal and Integument   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 tophus - Primary    Discussed; likely familial; discussed foods to limit with upcoming holidays, specifically watch intake of Malaw[place]key and gravy; try tart cherry juice;  refills of allopurinol provided; FMLA papers completed for him, since his flares cause him to miss work occasionally      Relevant Medications   allopurinol (ZYLOPRIM) 300 MG tablet   allopurinol (ZYLOPRIM) 100 MG tablet     Other   Pre-diabetes (Chronic)    Resolved after bariatric surgery      Hypertriglyceridemia    Resolved after bariatric surgery       Other Visit Diagnoses    Needs flu shot       Relevant Orders   Flu Vaccine QUAD 6+ mos PF IM (Fluarix Quad PF) (Completed)       Follow up plan: Return in about 6 months (around 08/27/2017).  An after-visit summary was printed and given to the patient at check-out.  Please see the patient instructions which may contain other information and recommendations beyond what is mentioned above in the assessment and plan.  Meds ordered this encounter  Medications  . chlorhexidine (PERIDEX) 0.12 % solution    Sig: 0.12 mLs 2 (two) times daily by Mouth Rinse route.    Refill:  6  . allopurinol (ZYLOPRIM) 300 MG tablet    Sig: Take 1 tablet (300 mg total) at bedtime by mouth.    Dispense:  90 tablet    Refill:  2  . allopurinol (ZYLOPRIM) 100 MG tablet    Sig: Take 1 tablet (100 mg total) daily by mouth. Along with a 300 mg pill = 400 mg daily    Dispense:  90 tablet    Refill:  2    Orders Placed This Encounter  Procedures  . Flu Vaccine QUAD 6+ mos PF IM (Fluarix Quad PF)

## 2017-02-27 NOTE — Assessment & Plan Note (Signed)
Discussed; likely familial; discussed foods to limit with upcoming holidays, specifically watch intake of [place]key and gravy; try tart cherry juice; refills of allopurinol provided; FMLA papers completed for him, since his flares cause him to miss work occasionally

## 2017-04-19 ENCOUNTER — Encounter: Payer: 59 | Admitting: Family Medicine

## 2017-04-28 DIAGNOSIS — Z9884 Bariatric surgery status: Secondary | ICD-10-CM | POA: Diagnosis not present

## 2017-04-28 DIAGNOSIS — R7303 Prediabetes: Secondary | ICD-10-CM | POA: Diagnosis not present

## 2017-06-03 NOTE — Progress Notes (Signed)
Closing out lab/order note open since:  04/26/16

## 2017-06-03 NOTE — Progress Notes (Signed)
This is actually from the bariatric surgeon

## 2017-06-03 NOTE — Progress Notes (Signed)
Closing out lab/order note open since:  Oct 2017 

## 2017-06-03 NOTE — Progress Notes (Signed)
Closing out lab/order note open since:  04/26/16 

## 2017-06-03 NOTE — Progress Notes (Signed)
Closing out lab/order note open since:  04/27/16

## 2017-06-03 NOTE — Progress Notes (Signed)
Closing out lab/order note open since:  06/01/16

## 2017-06-03 NOTE — Progress Notes (Signed)
Closing out lab/order note open since:  Nov 2017 

## 2017-07-05 DIAGNOSIS — H5203 Hypermetropia, bilateral: Secondary | ICD-10-CM | POA: Diagnosis not present

## 2017-07-12 IMAGING — RF DG UGI W/ KUB
10 series · 10 of 10 positions shown · non-contrast
Comparison: None.

CLINICAL DATA: Morbid obesity.  Pre bariatric surgery.

EXAM:
UPPER GI SERIES WITH KUB
TECHNIQUE: After obtaining a scout radiograph a routine upper GI series was
performed using thin barium
FLUOROSCOPY TIME:  Radiation Exposure Index (as provided by the
fluoroscopic device): 26.7 mGy

[Series 1: t abdomen supine · 0.15mm/px · 1 of 1 slices shown]
[im 1/1]
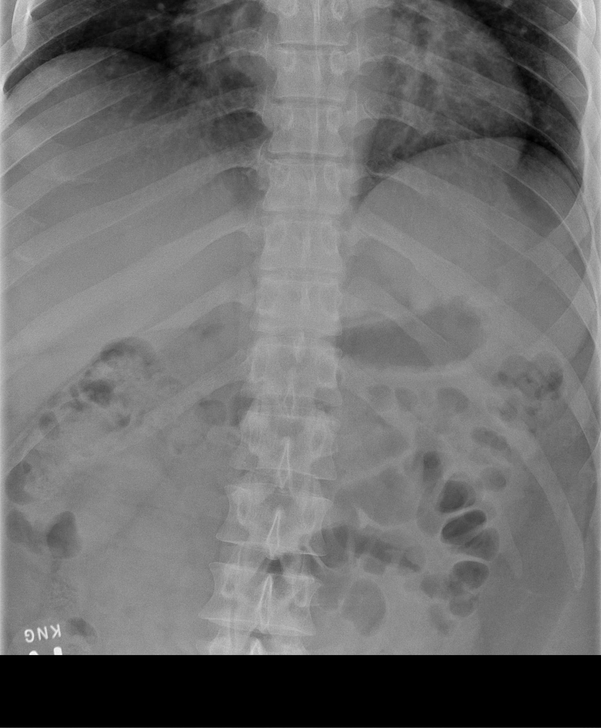

[Series 2: cp_standard · 0.26mm/px · 1 of 1 slices shown (1 of 9)]
[im 1/1]
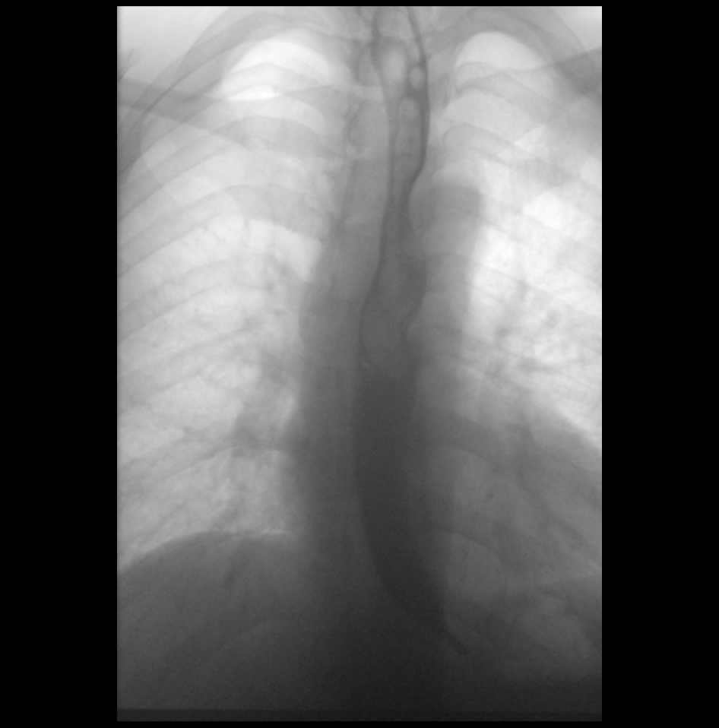

[Series 3: cp_standard · 0.18mm/px · 1 of 1 slices shown (2 of 9)]
[im 1/1]
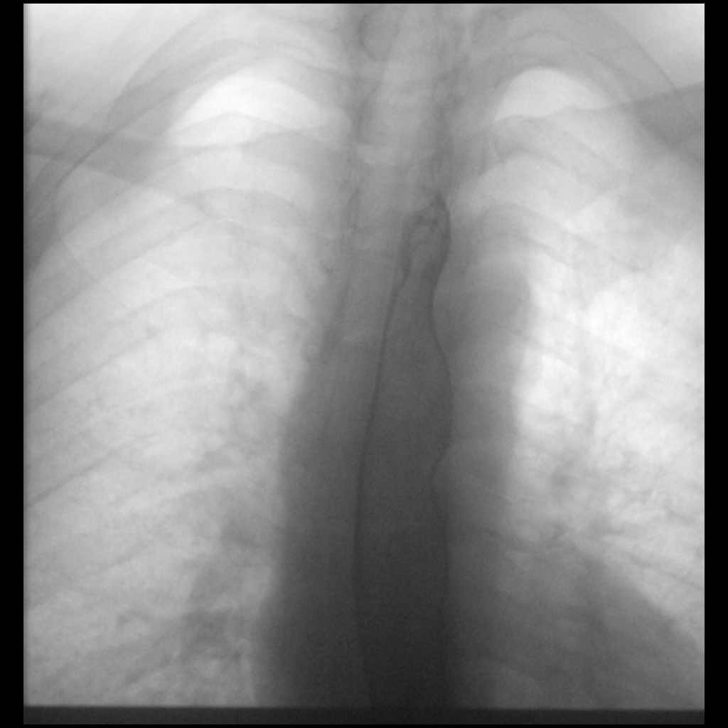

[Series 4: cp_standard · 0.18mm/px · 1 of 1 slices shown (3 of 9)]
[im 1/1]
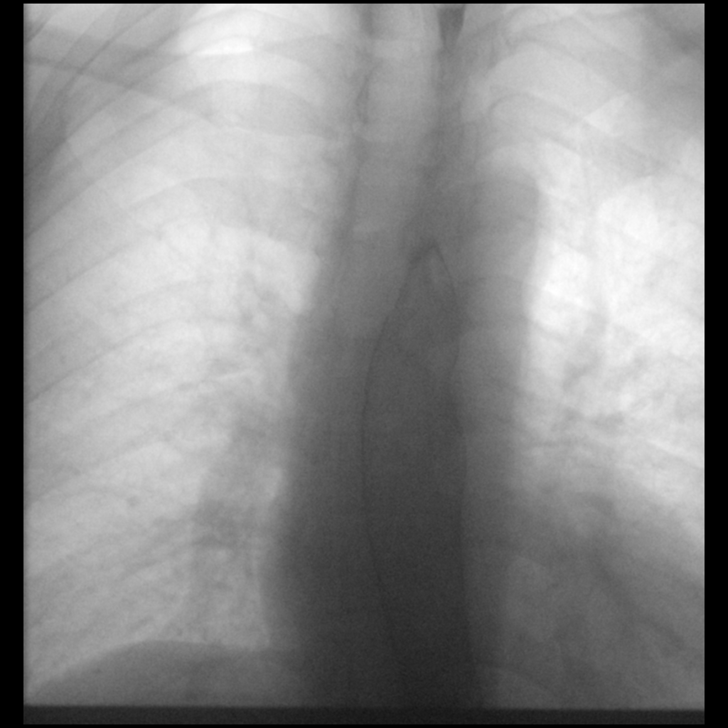

[Series 5: cp_standard · 0.18mm/px · 1 of 1 slices shown (4 of 9)]
[im 1/1]
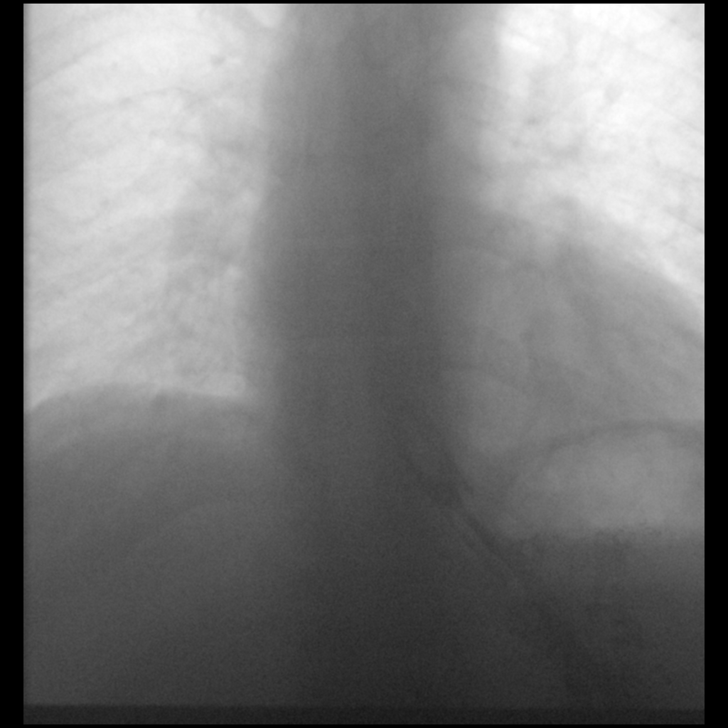

[Series 6: cp_standard · 0.18mm/px · 1 of 1 slices shown (5 of 9)]
[im 1/1]
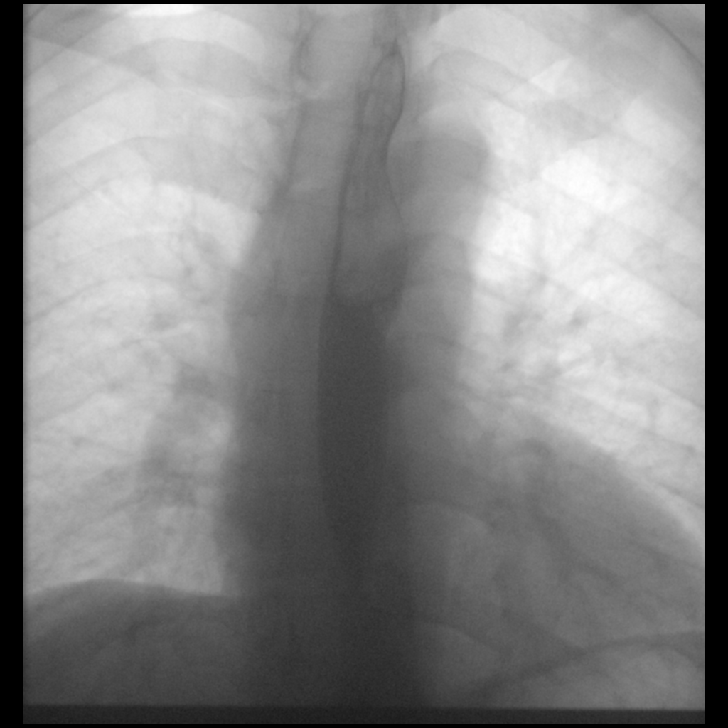

[Series 7: cp_standard · 0.18mm/px · 1 of 1 slices shown (6 of 9)]
[im 1/1]
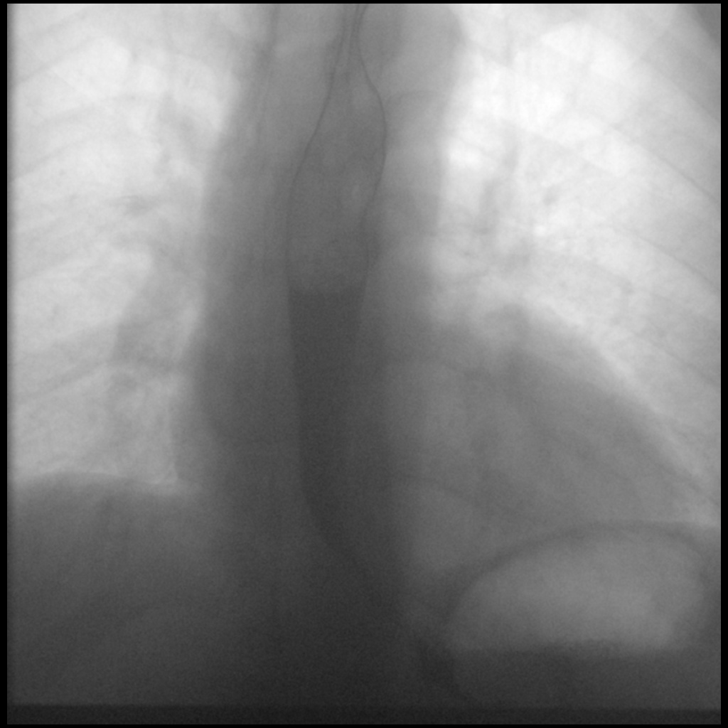

[Series 8: cp_standard · 0.17mm/px · 1 of 1 slices shown (7 of 9)]
[im 1/1]
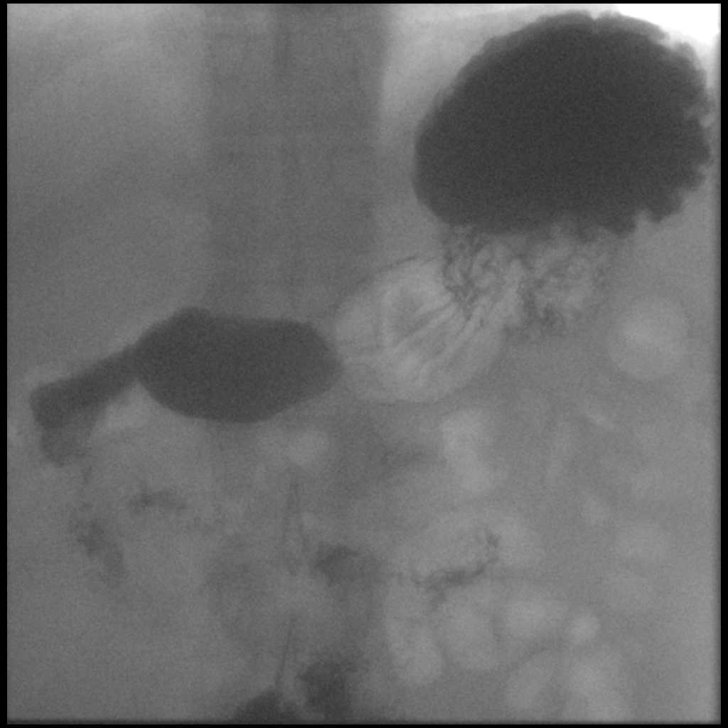

[Series 9: cp_standard · 0.17mm/px · 1 of 1 slices shown (8 of 9)]
[im 1/1]
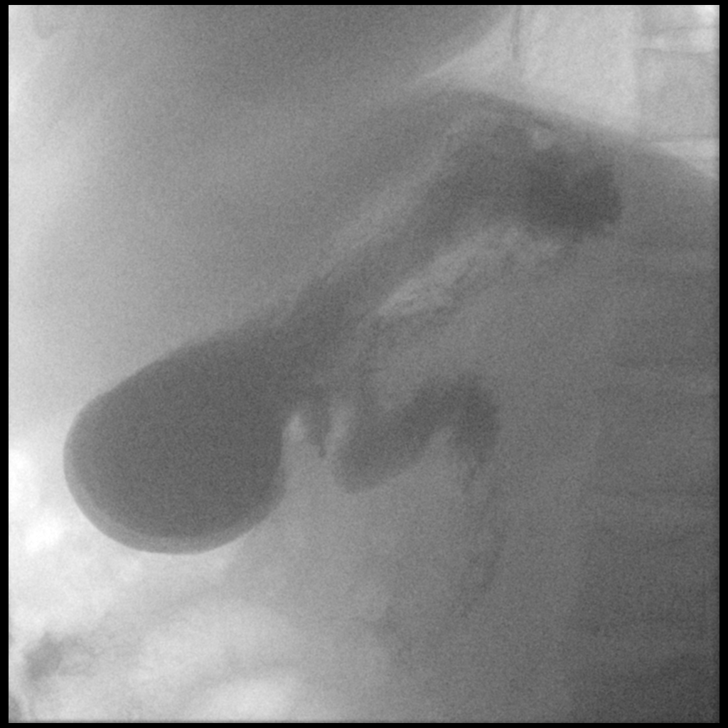

[Series 10: cp_standard · 0.18mm/px · 1 of 1 slices shown (9 of 9)]
[im 1/1]
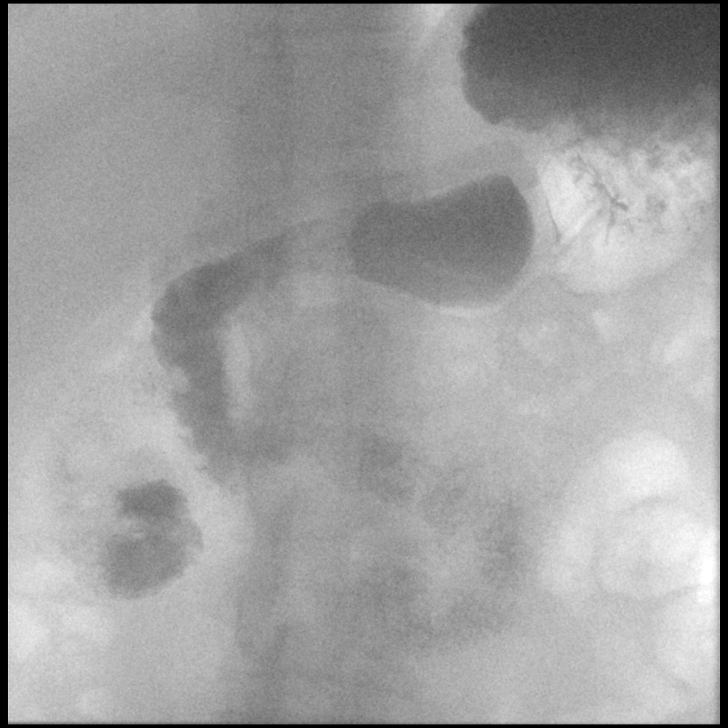

[10 of 10 positions shown; findings below may reference images not displayed]

FINDINGS: The scout radiograph appears normal. No abnormal bowel dilatation
identified.

The esophagus is patent. No stricture or mass identified. The
stomach, duodenal bulb and sweep are normal. No reflux identified
and no hiatal hernia.
IMPRESSION: 1. Normal upper GI exam.

## 2017-07-12 IMAGING — DX DG CHEST 2V
2 series · 2 of 2 positions shown · non-contrast
Comparison: None.

CLINICAL DATA: Preop bariatric surgery.

EXAM:
CHEST  2 VIEW

[chest pa]
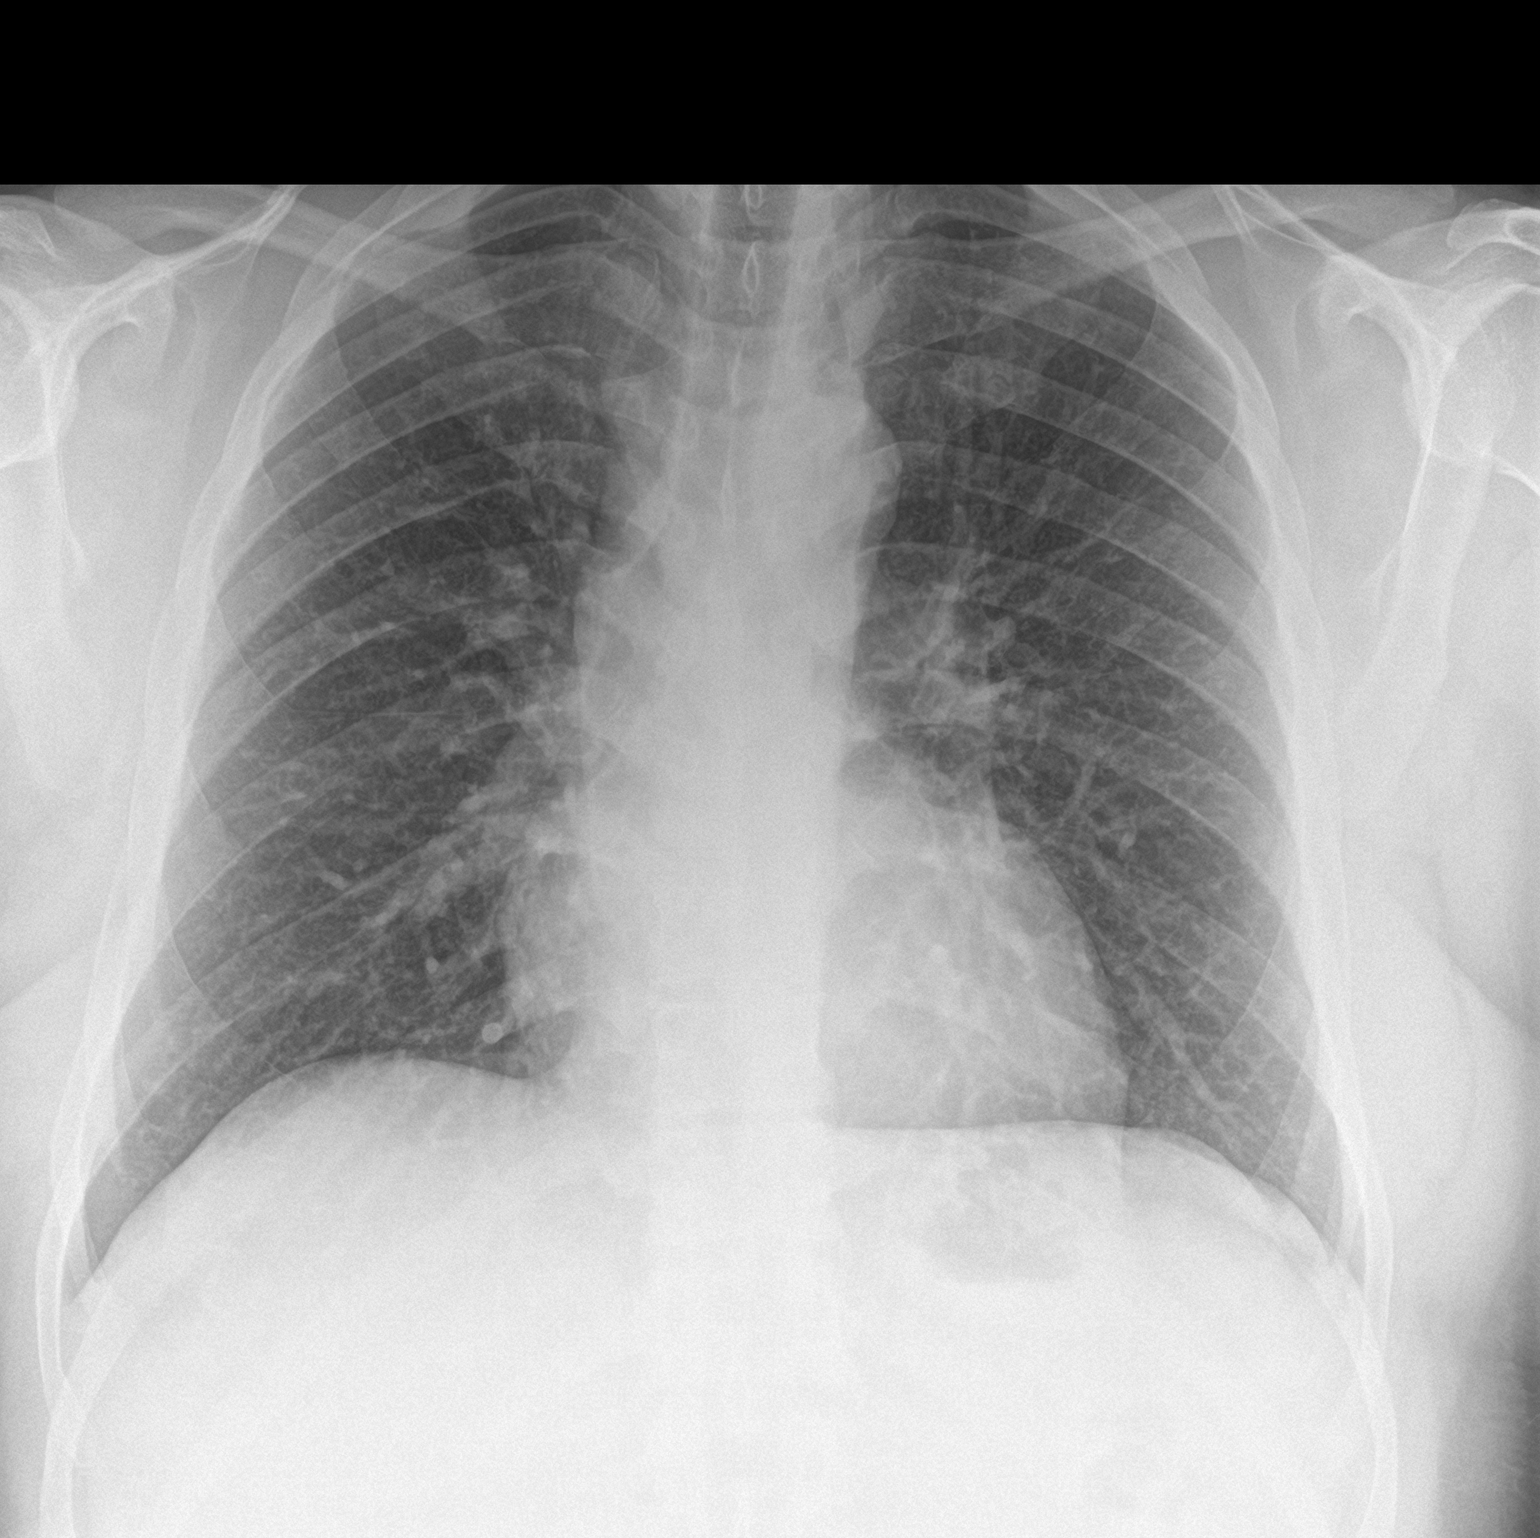

[chest lat]
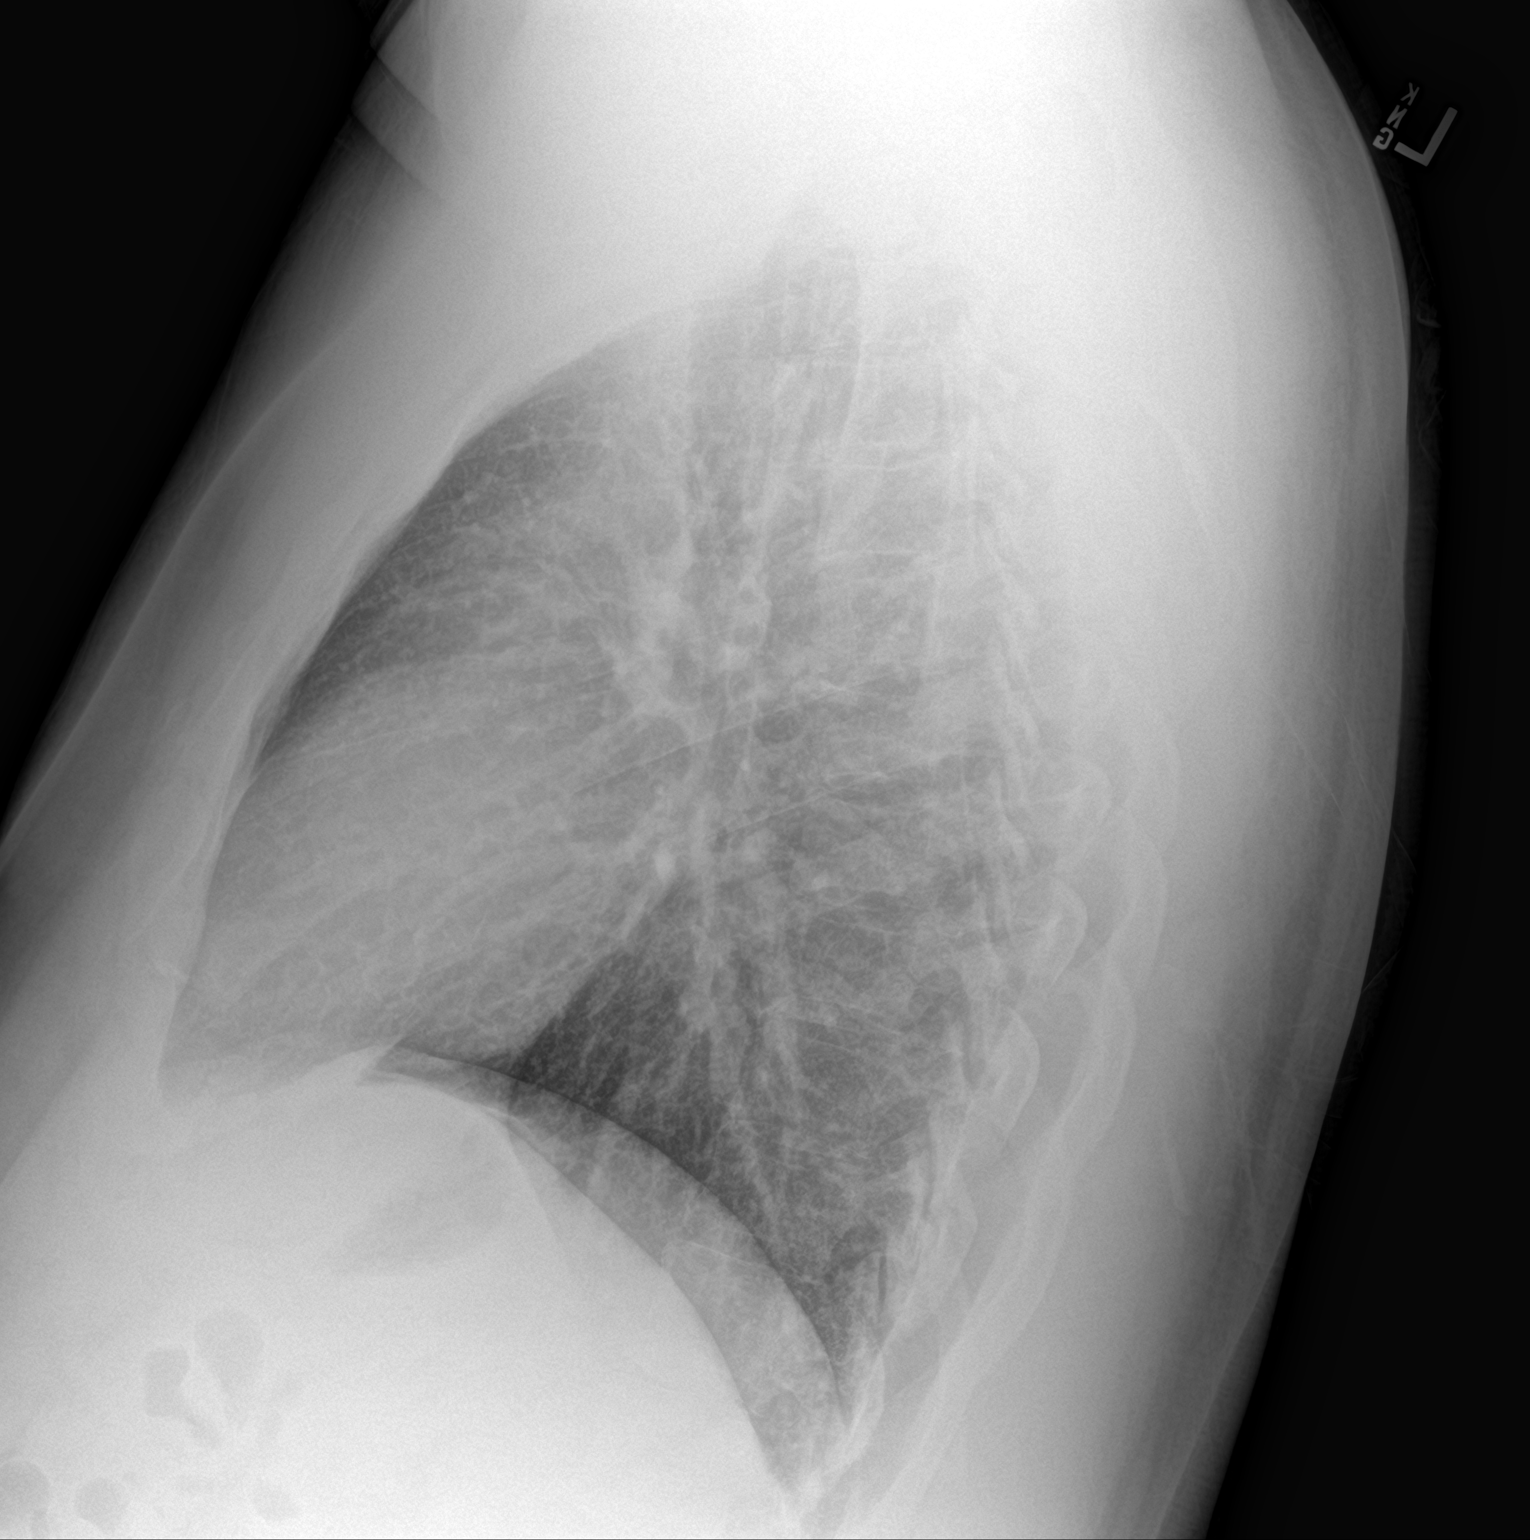

[2 of 2 positions shown; findings below may reference images not displayed]

FINDINGS: Heart and mediastinal contours are within normal limits. No focal
opacities or effusions. No acute bony abnormality.
IMPRESSION: No active cardiopulmonary disease.

## 2017-08-04 DIAGNOSIS — Z23 Encounter for immunization: Secondary | ICD-10-CM | POA: Diagnosis not present

## 2017-09-01 ENCOUNTER — Ambulatory Visit: Payer: 59 | Admitting: Family Medicine

## 2017-09-01 ENCOUNTER — Encounter: Payer: Self-pay | Admitting: Family Medicine

## 2017-09-01 ENCOUNTER — Telehealth: Payer: Self-pay | Admitting: Family Medicine

## 2017-09-01 VITALS — BP 118/84 | HR 80 | Temp 98.3°F | Resp 14 | Ht 76.0 in | Wt 221.2 lb

## 2017-09-01 DIAGNOSIS — M1A0720 Idiopathic chronic gout, left ankle and foot, without tophus (tophi): Secondary | ICD-10-CM | POA: Diagnosis not present

## 2017-09-01 DIAGNOSIS — Z5181 Encounter for therapeutic drug level monitoring: Secondary | ICD-10-CM

## 2017-09-01 DIAGNOSIS — R7303 Prediabetes: Secondary | ICD-10-CM | POA: Diagnosis not present

## 2017-09-01 DIAGNOSIS — M1A09X0 Idiopathic chronic gout, multiple sites, without tophus (tophi): Secondary | ICD-10-CM

## 2017-09-01 MED ORDER — COLCHICINE 0.6 MG PO TABS: ORAL_TABLET | ORAL | 5 refills | Status: DC

## 2017-09-01 NOTE — Progress Notes (Signed)
BP 118/84   Pulse 80   Temp 98.3 F (36.8 C) (Oral)   Resp 14   Ht  (1.93 m)   Wt 221 lb 3.2 oz (100.3 kg)   SpO2 98%   BMI 26.93 kg/m    Subjective:    Patient ID: Donald [name], male    DOB: 03/05/1981, 37 y.o.   MRN: [id number]  HPI: Donald [name] is a 37 y.o. male  Chief Complaint  Patient presents with  . Follow-up    HPI Patient is here for f/u Gout; last time he had a flare was a few weeks ago; came out of nowhere; left 1st MTP; that seems to be the site, same area; had some gravy, but pretty much eats the same stuff all the time; just random really; has not tried tart cherry juiice; no gout in the family; taking 400 mg allpurinol total daily; might get 7-10 flares a year; sometimes it lasts a day or a few days; when he gets a flare, he sits back and doesn't do anything; he had the sleeve surgery; cannot take NSAIDs  Reviewed labs from bariatric surgery; excellent A1c and lipids; he is a stable, goal weight; pleased with having had surgery  Depression screen [facility] 2/9 09/01/2017 02/27/2017 10/26/2016 05/19/2016 04/06/2016  Decreased Interest 0 0 0 0 0  Down, Depressed, Hopeless 0 0 0 0 0  PHQ - 2 Score 0 0 0 0 0   Relevant past medical, surgical, family and social history reviewed Past Medical History:  Diagnosis Date  . Benign essential HTN   . Family hx of prostate cancer 09/10/2015  . History of sleeve gastrectomy 03/21/2016  . Hyperlipidemia LDL goal <100   . Hypertension   . Pre-diabetes   . Situational anxiety    Past Surgical History:  Procedure Laterality Date  . LAPAROSCOPIC GASTRIC SLEEVE RESECTION N/A 03/21/2016   Procedure: LAPAROSCOPIC GASTRIC SLEEVE RESECTION, UPPER ENDO;  Surgeon: Gaynelle Adu, MD;  Location: [facility];  Service: General;  Laterality: N/A;  . WISDOM TOOTH EXTRACTION     Family History  Problem Relation Age of Onset  . Hyperlipidemia Mother   . Diabetes Mother   . Diabetes Father   . Prostate cancer Father   . Prostate  cancer Paternal Grandfather    Social History   Tobacco Use  . Smoking status: Former Smoker    Types: Cigarettes  . Smokeless tobacco: Never Used  . Tobacco comment: smokes at social events rarely  Substance Use Topics  . Alcohol use: Yes    Alcohol/week: 0.0 - 0.6 oz    Comment: socially  . Drug use: No   Interim medical history since last visit reviewed. Allergies and medications reviewed  Review of Systems Per HPI unless specifically indicated above     Objective:    BP 118/84   Pulse 80   Temp 98.3 F (36.8 C) (Oral)   Resp 14   Ht  (1.93 m)   Wt 221 lb 3.2 oz (100.3 kg)   SpO2 98%   BMI 26.93 kg/m   Wt Readings from Last 3 Encounters:  09/01/17 221 lb 3.2 oz (100.3 kg)  02/27/17 221 lb 14.4 oz (100.7 kg)  11/29/16 220 lb 12.8 oz (100.2 kg)    Physical Exam  Constitutional: He appears well-developed and well-nourished. No distress.  Eyes: No scleral icterus.  Cardiovascular: Normal rate and regular rhythm.  Pulmonary/Chest: Effort normal and breath sounds normal.  Neurological: He is  alert.  Skin: No pallor.  Psychiatric: He has a normal mood and affect.    Results for orders placed or performed in visit on 06/01/16  Basic Metabolic Panel (BMET)  Result Value Ref Range   Glucose 78 65 - 99 mg/dL   BUN 11 6 - 20 mg/dL   Creatinine, Ser 1.61 0.76 - 1.27 mg/dL   GFR calc non Af Amer 88 >59 mL/min/1.73   GFR calc Af Amer 102 >59 mL/min/1.73   BUN/Creatinine Ratio 10 9 - 20   Sodium 139 134 - 144 mmol/L   Potassium 4.4 3.5 - 5.2 mmol/L   Chloride 99 96 - 106 mmol/L   CO2 27 18 - 29 mmol/L   Calcium 9.9 8.7 - 10.2 mg/dL  Uric acid  Result Value Ref Range   Uric Acid 5.2 3.7 - 8.6 mg/dL      Assessment & Plan:   Problem List Items Addressed This Visit      Musculoskeletal and Integument   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 tophus    Will have him try to avoid purine-rich foods; suggested tart cherry juice; will check uric  acid and BMP today; consider increasing allopurinol; will contact bariatric surgeon to see if he can take colchicine for flares        Other   Medication monitoring encounter   Relevant Orders   Basic metabolic panel   Pre-diabetes (Chronic)    Last A1c per bariatric surgeon was 5.4; status post gastric sleeve; significant weight loss has imrpoved this       Other Visit Diagnoses    Idiopathic chronic gout of left foot without tophus    -  Primary   Relevant Orders   Uric acid   Basic metabolic panel       Follow up plan: Return in about 1 year (around 09/02/2018) for follow-up visit with Dr. Sherie Don.  An after-visit summary was printed and given to the patient at check-out.  Please see the patient instructions which may contain other information and recommendations beyond what is mentioned above in the assessment and plan.  No orders of the defined types were placed in this encounter.   Orders Placed This Encounter  Procedures  . Uric acid  . Basic metabolic panel

## 2017-09-01 NOTE — Assessment & Plan Note (Signed)
Last A1c per bariatric surgeon was 5.4; status post gastric sleeve; significant weight loss has imrpoved this

## 2017-09-01 NOTE — Assessment & Plan Note (Signed)
Will have him try to avoid purine-rich foods; suggested tart cherry juice; will check uric acid and BMP today; consider increasing allopurinol; will contact bariatric surgeon to see if he can take colchicine for flares

## 2017-09-01 NOTE — Telephone Encounter (Signed)
The Nurse was contacted becky and asked Dr. Andrey Campanile, he states colchicine will be fine

## 2017-09-01 NOTE — Telephone Encounter (Signed)
Please call bariatric surgeon (Dr. Gaynelle Adu) and find out if okay for patient to take colchicine for gout flares; we have to avoid NSAIDs

## 2017-09-01 NOTE — Telephone Encounter (Signed)
Pt.notified

## 2017-09-01 NOTE — Patient Instructions (Addendum)
Consider tart cherry juice for prevention Avoid as many of the purine rich foods as possible  Low-Purine Diet Purines are compounds that affect the level of uric acid in your body. A low-purine diet is a diet that is low in purines. Eating a low-purine diet can prevent the level of uric acid in your body from getting too high and causing gout or kidney stones or both. What do I need to know about this diet?  Choose low-purine foods. Examples of low-purine foods are listed in the next section.  Drink plenty of fluids, especially water. Fluids can help remove uric acid from your body. Try to drink 8-16 cups (1.9-3.8 L) a day.  Limit foods high in fat, especially saturated fat, as fat makes it harder for the body to get rid of uric acid. Foods high in saturated fat include pizza, cheese, ice cream, whole milk, fried foods, and gravies. Choose foods that are lower in fat and lean sources of protein. Use olive oil when cooking as it contains healthy fats that are not high in saturated fat.  Limit alcohol. Alcohol interferes with the elimination of uric acid from your body. If you are having a gout attack, avoid all alcohol.  Keep in mind that different people's bodies react differently to different foods. You will probably learn over time which foods do or do not affect you. If you discover that a food tends to cause your gout to flare up, avoid eating that food. You can more freely enjoy foods that do not cause problems. If you have any questions about a food item, talk to your dietitian or health care provider. Which foods are low, moderate, and high in purines? The following is a list of foods that are low, moderate, and high in purines. You can eat any amount of the foods that are low in purines. You may be able to have small amounts of foods that are moderate in purines. Ask your health care provider how much of a food moderate in purines you can have. Avoid foods high in purines. Grains  Foods  low in purines: Enriched white bread, pasta, rice, cake, cornbread, popcorn.  Foods moderate in purines: Whole-grain breads and cereals, wheat germ, bran, oatmeal. Uncooked oatmeal. Dry wheat bran or wheat germ.  Foods high in purines: Pancakes, [place] toast, biscuits, muffins. Vegetables  Foods low in purines: All vegetables, except those that are moderate in purines.  Foods moderate in purines: Asparagus, cauliflower, spinach, mushrooms, green peas. Fruits  All fruits are low in purines. Meats and other Protein Foods  Foods low in purines: Eggs, nuts, peanut butter.  Foods moderate in purines: 80-90% lean beef, lamb, veal, pork, poultry, fish, eggs, peanut butter, nuts. Crab, lobster, oysters, and shrimp. Cooked dried beans, peas, and lentils.  Foods high in purines: Anchovies, sardines, herring, mussels, tuna, codfish, scallops, trout, and haddock. Donald [name]. Organ meats (such as liver or kidney). Tripe. Game meat. Goose. Sweetbreads. Dairy  All dairy foods are low in purines. Low-fat and fat-free dairy products are best because they are low in saturated fat. Beverages  Drinks low in purines: Water, carbonated beverages, tea, coffee, cocoa.  Drinks moderate in purines: Soft drinks and other drinks sweetened with high-fructose corn syrup. Juices. To find whether a food or drink is sweetened with high-fructose corn syrup, look at the ingredients list.  Drinks high in purines: Alcoholic beverages (such as beer). Condiments  Foods low in purines: Salt, herbs, olives, pickles, relishes, vinegar.  Foods moderate in  purines: Butter, margarine, oils, mayonnaise. Fats and Oils  Foods low in purines: All types, except gravies and sauces made with meat.  Foods high in purines: Gravies and sauces made with meat. Other Foods  Foods low in purines: Sugars, sweets, gelatin. Cake. Soups made without meat.  Foods moderate in purines: Meat-based or fish-based soups, broths, or bouillons.  Foods and drinks sweetened with high-fructose corn syrup.  Foods high in purines: High-fat desserts (such as ice cream, cookies, cakes, pies, doughnuts, and chocolate). Contact your dietitian for more information on foods that are not listed here. This information is not intended to replace advice given to you by your health care provider. Make sure you discuss any questions you have with your health care provider. Document Released: 07/30/2010 Document Revised: 09/10/2015 Document Reviewed: 03/11/2013 Elsevier Interactive Patient Education  2017 [employer].

## 2017-09-01 NOTE — Telephone Encounter (Signed)
Thank you so much Please let patient know that we have something now that he can take even after his bariatric surgery I sent the new prescription in for him

## 2017-09-02 LAB — BASIC METABOLIC PANEL
BUN / CREAT RATIO: 11 (ref 9–20)
BUN: 14 mg/dL (ref 6–20)
CALCIUM: 10.2 mg/dL (ref 8.7–10.2)
CO2: 26 mmol/L (ref 20–29)
CREATININE: 1.29 mg/dL — AB (ref 0.76–1.27)
Chloride: 102 mmol/L (ref 96–106)
GFR calc non Af Amer: 71 mL/min/{1.73_m2} (ref >59)
GFR, EST AFRICAN AMERICAN: 82 mL/min/{1.73_m2} (ref >59)
Glucose: 76 mg/dL (ref 65–99)
Potassium: 5 mmol/L (ref 3.5–5.2)
Sodium: 141 mmol/L (ref 134–144)

## 2017-09-02 LAB — URIC ACID: Uric Acid: 6.4 mg/dL (ref 3.7–8.6)

## 2017-10-30 ENCOUNTER — Encounter: Payer: 59 | Admitting: Family Medicine

## 2017-10-31 ENCOUNTER — Ambulatory Visit (INDEPENDENT_AMBULATORY_CARE_PROVIDER_SITE_OTHER): Payer: 59 | Admitting: Family Medicine

## 2017-10-31 ENCOUNTER — Encounter: Payer: Self-pay | Admitting: Family Medicine

## 2017-10-31 VITALS — BP 124/82 | HR 85 | Temp 98.0°F | Resp 12 | Ht 76.0 in | Wt 223.1 lb

## 2017-10-31 DIAGNOSIS — Z8042 Family history of malignant neoplasm of prostate: Secondary | ICD-10-CM | POA: Diagnosis not present

## 2017-10-31 DIAGNOSIS — Z0000 Encounter for general adult medical examination without abnormal findings: Secondary | ICD-10-CM | POA: Diagnosis not present

## 2017-10-31 DIAGNOSIS — R7303 Prediabetes: Secondary | ICD-10-CM

## 2017-10-31 DIAGNOSIS — Z9884 Bariatric surgery status: Secondary | ICD-10-CM

## 2017-10-31 DIAGNOSIS — M1A09X0 Idiopathic chronic gout, multiple sites, without tophus (tophi): Secondary | ICD-10-CM

## 2017-10-31 NOTE — Assessment & Plan Note (Addendum)
USPSTF grade A and B recommendations reviewed with patient; age-appropriate recommendations, preventive care, screening tests, etc discussed and encouraged; healthy living encouraged; see AVS for patient education given to patient Recommended flu shot this fall; colon cancer screening starting at age 36

## 2017-10-31 NOTE — Assessment & Plan Note (Signed)
Patient wishes to decrease his allopurinol from 400 mg daily to 300 mg daily; check uric acid in 2 weeks; try tart cherry juice

## 2017-10-31 NOTE — Assessment & Plan Note (Addendum)
Advised against NSAIDs; patient to check with surgeon about NSAIDs; patient will call him; continue multiple vitamin

## 2017-10-31 NOTE — Progress Notes (Signed)
Patient ID: Donald [name], male   DOB: [birth year], 37 y.o.   MRN: [id number]   Subjective:   Donald [name] is a 37 y.o. male here for a complete physical exam  Interim issues since last visit: no medical excitement; very mild recent gout flares since last visit; despite this, he would like to decrease the allopurinol from 400 mg daily to 300 mg daily He used to have prediabetes; he had bariatric surgery and lost significant weight; last A1c was 6.3 in 2017, had been checked by bariatric surgeon; he sees him once a year; he did take some ibuprofen for some back pain not long ago, and I immediately cautioned him that I don't think he should take any NSAIDs after bariatric surgery and advised him to call his surgeon to make sure  Lab Results  Component Value Date   HGBA1C 6.3 (H) 03/14/2016   USPSTF grade A and B recommendations Depression:  Depression screen [facility] 2/9 10/31/2017 09/01/2017 02/27/2017 10/26/2016 05/19/2016  Decreased Interest 0 0 0 0 0  Down, Depressed, Hopeless 0 0 0 0 0  PHQ - 2 Score 0 0 0 0 0   Hypertension: BP Readings from Last 3 Encounters:  10/31/17 124/82  09/01/17 118/84  02/27/17 116/68   Obesity: s/p bariatric surgery Wt Readings from Last 3 Encounters:  10/31/17 223 lb 1.6 oz (101.2 kg)  09/01/17 221 lb 3.2 oz (100.3 kg)  02/27/17 221 lb 14.4 oz (100.7 kg)   BMI Readings from Last 3 Encounters:  10/31/17 27.16 kg/m  09/01/17 26.93 kg/m  02/27/17 27.01 kg/m    Immunizations: utd Skin cancer: nothing worrisome; two red bumps faded on the left bicep (from water); larger mark on right bicep Lung cancer:  nonsmoker Prostate cancer: family hx positive; father was in his 27s No results found for: PSA Colorectal cancer: no fam hx; start at age 77 AAA: n/a Aspirin: n/a Diet: if he hasn't eaten good the last day or two, he'll eat better; not eating like he used to Exercise: gym at the house, tries to work out, not as much as he'd like Alcohol:  social drinker, occasional   [facility] Visit from 10/31/2017 in [facility]  AUDIT-C Score  1     Tobacco use: never HIV, hep B, hep C: already tested for HIV STD testing and prevention (chl/gon/syphilis): not concerned Glucose:  Glucose  Date Value Ref Range Status  09/01/2017 76 65 - 99 mg/dL Final  40/98/1191 78 65 - 99 mg/dL Final  47/82/9562 81 65 - 99 mg/dL Final   Glucose, Bld  Date Value Ref Range Status  03/22/2016 115 (H) 65 - 99 mg/dL Final  13/11/6576 88 65 - 99 mg/dL Final  46/96/2952 92 65 - 99 mg/dL Final   Glucose-Capillary  Date Value Ref Range Status  03/21/2016 159 (H) 65 - 99 mg/dL Final   Lipids:  Lab Results  Component Value Date   CHOL 217 (H) 02/01/2016   CHOL 176 09/10/2015   CHOL 167 02/20/2015   Lab Results  Component Value Date   HDL 48 02/01/2016   HDL 52 09/10/2015   HDL 40 02/20/2015   Lab Results  Component Value Date   LDLCALC 105 02/01/2016   LDLCALC 104 (H) 09/10/2015   LDLCALC 102 (H) 02/20/2015   Lab Results  Component Value Date   TRIG 318 (H) 02/01/2016   TRIG 99 09/10/2015   TRIG 127 02/20/2015   Lab Results  Component Value Date  CHOLHDL 4.5 02/01/2016   CHOLHDL 4.2 02/20/2015   No results found for: LDLDIRECT   Past Medical History:  Diagnosis Date  . Benign essential HTN   . Family hx of prostate cancer 09/10/2015  . History of sleeve gastrectomy 03/21/2016  . Hyperlipidemia LDL goal <100   . Hypertension   . Pre-diabetes   . Situational anxiety    Past Surgical History:  Procedure Laterality Date  . LAPAROSCOPIC GASTRIC SLEEVE RESECTION N/A 03/21/2016   Procedure: LAPAROSCOPIC GASTRIC SLEEVE RESECTION, UPPER ENDO;  Surgeon: Gaynelle AduEric [name], MD;  Location: [facility];  Service: General;  Laterality: N/A;  . WISDOM TOOTH EXTRACTION     Family History  Problem Relation Age of Onset  . Hyperlipidemia Mother   . Diabetes Mother   . Diabetes Father   . Prostate cancer Father   . Prostate  cancer Paternal Grandfather    Social History   Tobacco Use  . Smoking status: Former Smoker    Types: Cigarettes  . Smokeless tobacco: Never Used  . Tobacco comment: smokes at social events rarely  Substance Use Topics  . Alcohol use: Yes    Alcohol/week: 0.0 - 0.6 oz    Comment: socially  . Drug use: No   Review of Systems  Constitutional: Negative for unexpected weight change.  HENT: Negative for hearing loss.   Eyes: Negative for visual disturbance.  Respiratory: Negative for wheezing.   Cardiovascular: Negative for chest pain.  Gastrointestinal: Negative for blood in stool.  Endocrine: Negative for polydipsia.  Genitourinary: Negative for hematuria.  Skin:       See HPI  Allergic/Immunologic: Negative for food allergies.  Neurological: Negative for tremors.  Hematological: Does not bruise/bleed easily.    Objective:   Vitals:   10/31/17 1116  BP: 124/82  Pulse: 85  Resp: 12  Temp: 98 F (36.7 C)  TempSrc: Oral  SpO2: 99%  Weight: 223 lb 1.6 oz (101.2 kg)  Height: 6\' 4"  (1.93 m)   Body mass index is 27.16 kg/m. Wt Readings from Last 3 Encounters:  10/31/17 223 lb 1.6 oz (101.2 kg)  09/01/17 221 lb 3.2 oz (100.3 kg)  02/27/17 221 lb 14.4 oz (100.7 kg)   Physical Exam  Constitutional: He appears well-developed and well-nourished. No distress.  HENT:  Head: Normocephalic and atraumatic.  Nose: Nose normal.  Mouth/Throat: Oropharynx is clear and moist.  Eyes: EOM are normal. No scleral icterus.  Neck: No JVD present. No thyromegaly present.  Cardiovascular: Normal rate, regular rhythm and normal heart sounds.  Pulmonary/Chest: Effort normal and breath sounds normal. No respiratory distress. He has no wheezes. He has no rales.  Abdominal: Soft. Bowel sounds are normal. He exhibits no distension. There is no tenderness. There is no guarding.  Musculoskeletal: Normal range of motion. He exhibits no edema.  Lymphadenopathy:    He has no cervical  adenopathy.  Neurological: He is alert. He displays normal reflexes. He exhibits normal muscle tone. Coordination normal.  Skin: Skin is warm and dry. No rash noted. He is not diaphoretic. No erythema. No pallor.  Psychiatric: He has a normal mood and affect. His behavior is normal. Judgment and thought content normal.    Assessment/Plan:   Problem List Items Addressed This Visit      Musculoskeletal and Integument   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 tophus    Patient wishes to decrease his allopurinol from 400 mg daily to 300 mg daily; check uric acid in 2 weeks; try tart  cherry juice      Relevant Orders   Uric acid     Other   S/P laparoscopic sleeve gastrectomy    Advised against NSAIDs; patient to check with surgeon about NSAIDs; patient will call him; continue multiple vitamin      Preventative health care - Primary    USPSTF grade A and B recommendations reviewed with patient; age-appropriate recommendations, preventive care, screening tests, etc discussed and encouraged; healthy living encouraged; see AVS for patient education given to patient Recommended flu shot this fall; colon cancer screening starting at age 53      Relevant Orders   CBC with Differential/Platelet   Comprehensive metabolic panel   Lipid panel   TSH   Pre-diabetes (Chronic)    Check A1c and glucose (patient will go in two weeks when he has his Cr and uric acid checked)      Relevant Orders   Hemoglobin A1c   Family hx of prostate cancer    Discussed starting to screen at age 21 or 15 since father was diagnosed young         No orders of the defined types were placed in this encounter.  Orders Placed This Encounter  Procedures  . CBC with Differential/Platelet  . Comprehensive metabolic panel  . Lipid panel  . TSH  . Uric acid  . Hemoglobin A1c    Follow up plan: Return in about 1 year (around 11/01/2018) for complete physical.  An After Visit Summary was printed  and given to the patient.

## 2017-10-31 NOTE — Assessment & Plan Note (Signed)
Check A1c and glucose (patient will go in two weeks when he has his Cr and uric acid checked)

## 2017-10-31 NOTE — Patient Instructions (Addendum)
Try tart cherry juice  Health Maintenance, Male A healthy lifestyle and preventive care is important for your health and wellness. Ask your health care provider about what schedule of regular examinations is right for you. What should I know about weight and diet? Eat a Healthy Diet  Eat plenty of vegetables, fruits, whole grains, low-fat dairy products, and lean protein.  Do not eat a lot of foods high in solid fats, added sugars, or salt.  Maintain a Healthy Weight Regular exercise can help you achieve or maintain a healthy weight. You should:  Do at least 150 minutes of exercise each week. The exercise should increase your heart rate and make you sweat (moderate-intensity exercise).  Do strength-training exercises at least twice a week.  Watch Your Levels of Cholesterol and Blood Lipids  Have your blood tested for lipids and cholesterol every 5 years starting at 36 years of age. If you are at high risk for heart disease, you should start having your blood tested when you are 36 years old. You may need to have your cholesterol levels checked more often if: ? Your lipid or cholesterol levels are high. ? You are older than 36 years of age. ? You are at high risk for heart disease.  What should I know about cancer screening? Many types of cancers can be detected early and may often be prevented. Lung Cancer  You should be screened every year for lung cancer if: ? You are a current smoker who has smoked for at least 30 years. ? You are a former smoker who has quit within the past 15 years.  Talk to your health care provider about your screening options, when you should start screening, and how often you should be screened.  Colorectal Cancer  Routine colorectal cancer screening usually begins at 36 years of age and should be repeated every 5-10 years until you are 36 years old. You may need to be screened more often if early forms of precancerous polyps or small growths are found.  Your health care provider may recommend screening at an earlier age if you have risk factors for colon cancer.  Your health care provider may recommend using home test kits to check for hidden blood in the stool.  A small camera at the end of a tube can be used to examine your colon (sigmoidoscopy or colonoscopy). This checks for the earliest forms of colorectal cancer.  Prostate and Testicular Cancer  Depending on your age and overall health, your health care provider may do certain tests to screen for prostate and testicular cancer.  Talk to your health care provider about any symptoms or concerns you have about testicular or prostate cancer.  Skin Cancer  Check your skin from head to toe regularly.  Tell your health care provider about any new moles or changes in moles, especially if: ? There is a change in a mole's size, shape, or color. ? You have a mole that is larger than a pencil eraser.  Always use sunscreen. Apply sunscreen liberally and repeat throughout the day.  Protect yourself by wearing long sleeves, pants, a wide-brimmed hat, and sunglasses when outside.  What should I know about heart disease, diabetes, and high blood pressure?  If you are 7318-739 years of age, have your blood pressure checked every 3-5 years. If you are 36 years of age or older, have your blood pressure checked every year. You should have your blood pressure measured twice-once when you are at a hospital  or clinic, and once when you are not at a hospital or clinic. Record the average of the two measurements. To check your blood pressure when you are not at a hospital or clinic, you can use: ? An automated blood pressure machine at a pharmacy. ? A home blood pressure monitor.  Talk to your health care provider about your target blood pressure.  If you are between 35-44 years old, ask your health care provider if you should take aspirin to prevent heart disease.  Have regular diabetes screenings by  checking your fasting blood sugar level. ? If you are at a normal weight and have a low risk for diabetes, have this test once every three years after the age of 62. ? If you are overweight and have a high risk for diabetes, consider being tested at a younger age or more often.  A one-time screening for abdominal aortic aneurysm (AAA) by ultrasound is recommended for men aged 70-75 years who are current or former smokers. What should I know about preventing infection? Hepatitis B If you have a higher risk for hepatitis B, you should be screened for this virus. Talk with your health care provider to find out if you are at risk for hepatitis B infection. Hepatitis C Blood testing is recommended for:  Everyone born from [birth year] through 1964.  Anyone with known risk factors for hepatitis C.  Sexually Transmitted Diseases (STDs)  You should be screened each year for STDs including gonorrhea and chlamydia if: ? You are sexually active and are younger than 36 years of age. ? You are older than 36 years of age and your health care provider tells you that you are at risk for this type of infection. ? Your sexual activity has changed since you were last screened and you are at an increased risk for chlamydia or gonorrhea. Ask your health care provider if you are at risk.  Talk with your health care provider about whether you are at high risk of being infected with HIV. Your health care provider may recommend a prescription medicine to help prevent HIV infection.  What else can I do?  Schedule regular health, dental, and eye exams.  Stay current with your vaccines (immunizations).  Do not use any tobacco products, such as cigarettes, chewing tobacco, and e-cigarettes. If you need help quitting, ask your health care provider.  Limit alcohol intake to no more than 2 drinks per day. One drink equals 12 ounces of beer, 5 ounces of wine, or 1 ounces of hard liquor.  Do not use street drugs.  Do not  share needles.  Ask your health care provider for help if you need support or information about quitting drugs.  Tell your health care provider if you often feel depressed.  Tell your health care provider if you have ever been abused or do not feel safe at home. This information is not intended to replace advice given to you by your health care provider. Make sure you discuss any questions you have with your health care provider. Document Released: 10/01/2007 Document Revised: 12/02/2015 Document Reviewed: 01/06/2015 Elsevier Interactive Patient Education  [employer].

## 2017-10-31 NOTE — Assessment & Plan Note (Signed)
Discussed starting to screen at age 37 or [age] since father was diagnosed young

## 2018-01-24 ENCOUNTER — Other Ambulatory Visit: Payer: Self-pay

## 2018-01-24 ENCOUNTER — Encounter (HOSPITAL_COMMUNITY): Payer: Self-pay | Admitting: Emergency Medicine

## 2018-01-24 ENCOUNTER — Emergency Department (HOSPITAL_COMMUNITY)
Admission: EM | Admit: 2018-01-24 | Discharge: 2018-01-24 | Disposition: A | Discharge status: Home / Self Care | Attending: Emergency Medicine | Admitting: Emergency Medicine

## 2018-01-24 DIAGNOSIS — M546 Pain in thoracic spine: Secondary | ICD-10-CM | POA: Insufficient documentation

## 2018-01-24 DIAGNOSIS — Z79899 Other long term (current) drug therapy: Secondary | ICD-10-CM | POA: Diagnosis not present

## 2018-01-24 DIAGNOSIS — Y990 Civilian activity done for income or pay: Secondary | ICD-10-CM | POA: Diagnosis not present

## 2018-01-24 DIAGNOSIS — Y939 Activity, unspecified: Secondary | ICD-10-CM | POA: Insufficient documentation

## 2018-01-24 DIAGNOSIS — Z87891 Personal history of nicotine dependence: Secondary | ICD-10-CM | POA: Insufficient documentation

## 2018-01-24 DIAGNOSIS — Y929 Unspecified place or not applicable: Secondary | ICD-10-CM | POA: Diagnosis not present

## 2018-01-24 DIAGNOSIS — I10 Essential (primary) hypertension: Secondary | ICD-10-CM | POA: Insufficient documentation

## 2018-01-24 DIAGNOSIS — M542 Cervicalgia: Secondary | ICD-10-CM | POA: Diagnosis not present

## 2018-01-24 MED ORDER — HYDROCODONE-ACETAMINOPHEN 5-325 MG PO TABS: 1.0000 | ORAL_TABLET | Freq: Four times a day (QID) | ORAL | 0 refills | Status: DC | PRN

## 2018-01-24 MED ORDER — METHOCARBAMOL 500 MG PO TABS: 500.0000 mg | ORAL_TABLET | Freq: Two times a day (BID) | ORAL | 0 refills | Status: DC

## 2018-01-24 MED ORDER — LIDOCAINE 5 % EX PTCH: 1.0000 | MEDICATED_PATCH | CUTANEOUS | 0 refills | Status: DC

## 2018-01-24 NOTE — ED Notes (Signed)
Pt is alert and oriented x 4 and is verbally responsive. Pt reports 7/10 back/neck pain sore
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
## 2018-01-24 NOTE — Discharge Instructions (Addendum)
Expect your soreness to increase over the next 2-3 days. Take it easy, but do not lay around too much as this may make any stiffness worse.  °Antiinflammatory medications: Take 600 mg of ibuprofen every 6 hours or 440 mg (over the counter dose) to 500 mg (prescription dose) of naproxen every 12 hours for the next 3 days. After this time, these medications may be used as needed for pain. Take these medications with food to avoid upset stomach. Choose only one of these medications, do not take them together. °Acetaminophen (generic for Tylenol): Should you continue to have additional pain while taking the ibuprofen or naproxen, you may add in acetaminophen as needed. Your daily total maximum amount of acetaminophen from all sources should be limited to 4000mg/day for persons without liver problems, or 2000mg/day for those with liver problems. °Muscle relaxer: Robaxin is a muscle relaxer and may help loosen stiff muscles. Do not take the Robaxin while driving or performing other dangerous activities.  °Lidocaine patches: These are available via either prescription or over-the-counter. The over-the-counter option may be more economical one and are likely just as effective. There are multiple over-the-counter brands, such as Salonpas. °Exercises: Be sure to perform the attached exercises starting with three times a week and working up to performing them daily. This is an essential part of preventing long term problems.  °Follow up: Follow up with a primary care provider for any future management of these complaints. Be sure to follow up within 7-10 days. °Return: Return to the ED should symptoms worsen. ° °For prescription assistance, may try using prescription discount sites or apps, such as goodrx.com °
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
## 2018-01-29 ENCOUNTER — Inpatient Hospital Stay: Payer: 59 | Admitting: Family Medicine

## 2018-08-22 DIAGNOSIS — Z9884 Bariatric surgery status: Secondary | ICD-10-CM | POA: Diagnosis not present

## 2018-08-22 DIAGNOSIS — K219 Gastro-esophageal reflux disease without esophagitis: Secondary | ICD-10-CM | POA: Diagnosis not present

## 2018-09-03 ENCOUNTER — Encounter: Payer: Self-pay | Admitting: Nurse Practitioner

## 2018-09-03 ENCOUNTER — Ambulatory Visit (INDEPENDENT_AMBULATORY_CARE_PROVIDER_SITE_OTHER): Payer: 59 | Admitting: Nurse Practitioner

## 2018-09-03 VITALS — Temp 97.6°F | Ht 76.0 in | Wt 222.0 lb

## 2018-09-03 DIAGNOSIS — K2100 Gastro-esophageal reflux disease with esophagitis, without bleeding: Secondary | ICD-10-CM

## 2018-09-03 DIAGNOSIS — M1A09X0 Idiopathic chronic gout, multiple sites, without tophus (tophi): Secondary | ICD-10-CM

## 2018-09-03 DIAGNOSIS — E781 Pure hyperglyceridemia: Secondary | ICD-10-CM | POA: Diagnosis not present

## 2018-09-03 DIAGNOSIS — K0530 Chronic periodontitis, unspecified: Secondary | ICD-10-CM

## 2018-09-03 DIAGNOSIS — R7303 Prediabetes: Secondary | ICD-10-CM | POA: Diagnosis not present

## 2018-09-03 MED ORDER — CHLORHEXIDINE GLUCONATE 0.12 % MT SOLN: 0.1200 mL | Freq: Two times a day (BID) | OROMUCOSAL | 6 refills | Status: DC

## 2018-09-03 MED ORDER — ALLOPURINOL 300 MG PO TABS: 300.0000 mg | ORAL_TABLET | Freq: Every day | ORAL | 3 refills | Status: DC

## 2018-09-03 NOTE — Progress Notes (Addendum)
Virtual Visit via Video Note  I connected with Donald [name] on 09/03/18 at  8:40 AM EDT by a video enabled telemedicine application and verified that I am speaking with the correct person using two identifiers.   Staff discussed the limitations of evaluation and management by telemedicine and the availability of in person appointments. The patient expressed understanding and agreed to proceed.  Patient location: work  My location: work office Other people present: none HPI Gout Allopurinol 300mg  daily with no missed doses.  Last time he had a gout flare was about 4 months ago- on toe.   Hyperlipidemia Had bariatric surgery- with Dr. Gaynelle AduEric [name] in 2017- weighed was 366 is about 220 lbs now.  Lab Results  Component Value Date   CHOL 217 (H) 02/01/2016   HDL 48 02/01/2016   LDLCALC 105 02/01/2016   TRIG 318 (H) 02/01/2016   CHOLHDL 4.5 02/01/2016   Prediabetes  He eats a protein and a salad for most meals, stays away from a lot of carbs, occasionally eats rice and pastas. Drinks water.  Lab Results  Component Value Date   HGBA1C 6.3 (H) 03/14/2016   GERD Starts taking protonix 40mg  daily, states since bariatric surgery started to have reflux- follows up with bariatric surgeon once a year. Just got an adjustable bed and has stopped eating late at night.   Gum disease PHQ2/9: [facility] 09/03/2018 10/31/2017 09/01/2017 02/27/2017 10/26/2016  Decreased Interest 0 0 0 0 0  Down, Depressed, Hopeless 0 0 0 0 0  PHQ - 2 Score 0 0 0 0 0    PHQ reviewed. Negative  Patient Active Problem List   Diagnosis Date Noted  . Medication monitoring encounter 05/31/2016  . Hypertriglyceridemia 03/21/2016  . S/P laparoscopic sleeve gastrectomy 03/21/2016  . Preventative health care 09/10/2015  . Family hx of prostate cancer 09/10/2015  . Situational anxiety 06/26/2015  . Pre-diabetes 02/20/2015  . 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  Hyperlipidemia LDL goal <100 01/27/2015    Past Medical History:  Diagnosis Date  . Benign essential HTN   . Family hx of prostate cancer 09/10/2015  . History of sleeve gastrectomy 03/21/2016  . Hyperlipidemia LDL goal <100   . Hypertension   . Pre-diabetes   . Situational anxiety     Past Surgical History:  Procedure Laterality Date  . LAPAROSCOPIC GASTRIC SLEEVE RESECTION N/A 03/21/2016   Procedure: LAPAROSCOPIC GASTRIC SLEEVE RESECTION, UPPER ENDO;  Surgeon: Gaynelle AduEric [name], MD;  Location: [facility];  Service: General;  Laterality: N/A;  . WISDOM TOOTH EXTRACTION      Social History   Tobacco Use  . Smoking status: Former Smoker    Types: Cigarettes  . Smokeless tobacco: Never Used  . Tobacco comment: smokes at social events rarely  Substance Use Topics  . Alcohol use: Yes    Alcohol/week: 0.0 - 1.0 standard drinks    Comment: socially     Current Outpatient Medications:  .  allopurinol (ZYLOPRIM) 300 MG tablet, Take 1 tablet (300 mg total) at bedtime by mouth., Disp: 90 tablet, Rfl: 2 .  Calcium 500 MG CHEW, Chew by mouth 3 (three) times daily., Disp: , Rfl:  .  chlorhexidine (PERIDEX) 0.12 % solution, 0.12 mLs 2 (two) times daily by Mouth Rinse route., Disp: , Rfl: 6 .  Multiple Vitamin (MULTIVITAMIN) tablet, Take 1 tablet by mouth daily., Disp: , Rfl:  .  colchicine 0.6 MG tablet, Take two pills at  onset of gout flare, followed by one pill one hour later; max of 3 pills per flare (Patient not taking: Reported on 09/03/2018), Disp: 3 tablet, Rfl: 5 .  HYDROcodone-acetaminophen (NORCO/VICODIN) 5-325 MG tablet, Take 1 tablet by mouth every 6 (six) hours as needed for severe pain. (Patient not taking: Reported on 09/03/2018), Disp: 6 tablet, Rfl: 0 .  lidocaine (LIDODERM) 5 %, Place 1 patch onto the skin daily. Remove & Discard patch within 12 hours or as directed by MD (Patient not taking: Reported on 09/03/2018), Disp: 30 patch, Rfl: 0 .  methocarbamol (ROBAXIN) 500 MG tablet,  Take 1 tablet (500 mg total) by mouth 2 (two) times daily. (Patient not taking: Reported on 09/03/2018), Disp: 20 tablet, Rfl: 0  No Known Allergies  ROS   No other specific complaints in a complete review of systems (except as listed in HPI above).  Objective  Vitals:   09/03/18 0826  Temp: 97.6 F (36.4 C)  TempSrc: Oral  Weight: 222 lb (100.7 kg)  Height: 6\' 4"  (1.93 m)     Body mass index is 27.02 kg/m.  Nursing Note and Vital Signs reviewed.  Physical Exam  Constitutional: Patient appears well-developed and well-nourished. No distress.  HENT: Head: Normocephalic and atraumatic. Pulmonary/Chest: Effort normal  Musculoskeletal: Normal range of motion,  Neurological: alert and oriented, speech normal.  Skin: No rash noted. No erythema.  Psychiatric: Patient has a normal mood and affect. behavior is normal. Judgment and thought content normal.    Assessment & Plan  1. 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 tophus stable - allopurinol (ZYLOPRIM) 300 MG tablet; Take 1 tablet (300 mg total) by mouth at bedtime.  Dispense: 90 tablet; Refill: 3  2. Pre-diabetes Likely normalized after weight loss, will monitor, eating has improved significantly  - HgB A1c - Comprehensive Metabolic Panel (CMET)  3. Hypertriglyceridemia Likely normalized after weight loss  - Lipid Profile  4. Gastroesophageal reflux disease with esophagitis Small frequent meals, bed wedge, avoid meals 2-3 hours before bedtime, PPI PRN   5. Severe gum disease Dentist is presently closed due to pandemic  - chlorhexidine (PERIDEX) 0.12 % solution; 0.12 mLs by Mouth Rinse route 2 (two) times daily.  Dispense: 120 mL; Refill: 6    Follow Up Instructions:   1 year I discussed the assessment and treatment plan with the patient. The patient was provided an opportunity to ask questions and all were answered. The patient agreed with the plan and demonstrated an understanding of the  instructions.   The patient was advised to call back or seek an in-person evaluation if the symptoms worsen or if the condition fails to improve as anticipated.  I provided 14 minutes of non-face-to-face time during this encounter.   Cheryle Horsfall, NP

## 2018-11-05 ENCOUNTER — Encounter: Payer: 59 | Admitting: Family Medicine

## 2019-01-10 ENCOUNTER — Encounter (HOSPITAL_COMMUNITY): Payer: Self-pay

## 2019-02-14 ENCOUNTER — Other Ambulatory Visit: Payer: Self-pay

## 2019-02-14 DIAGNOSIS — Z20822 Contact with and (suspected) exposure to covid-19: Secondary | ICD-10-CM

## 2019-02-15 LAB — NOVEL CORONAVIRUS, NAA: SARS-CoV-2, NAA: NOT DETECTED

## 2019-03-28 ENCOUNTER — Encounter: Payer: Self-pay | Admitting: Family Medicine

## 2019-03-29 NOTE — Telephone Encounter (Signed)
Tried contacting pt and left voice message asking him to return call and schedule appt

## 2019-04-25 ENCOUNTER — Encounter: Payer: Self-pay | Admitting: Family Medicine

## 2019-05-02 ENCOUNTER — Encounter: Payer: Self-pay | Admitting: Family Medicine

## 2019-05-02 ENCOUNTER — Ambulatory Visit (INDEPENDENT_AMBULATORY_CARE_PROVIDER_SITE_OTHER): Payer: 59 | Admitting: Family Medicine

## 2019-05-02 ENCOUNTER — Other Ambulatory Visit: Payer: Self-pay

## 2019-05-02 VITALS — BP 112/72 | HR 77 | Temp 96.1°F | Resp 12 | Ht 76.0 in | Wt 241.2 lb

## 2019-05-02 DIAGNOSIS — M1A09X0 Idiopathic chronic gout, multiple sites, without tophus (tophi): Secondary | ICD-10-CM | POA: Diagnosis not present

## 2019-05-02 DIAGNOSIS — E785 Hyperlipidemia, unspecified: Secondary | ICD-10-CM | POA: Diagnosis not present

## 2019-05-02 DIAGNOSIS — Z9884 Bariatric surgery status: Secondary | ICD-10-CM | POA: Diagnosis not present

## 2019-05-02 DIAGNOSIS — R7303 Prediabetes: Secondary | ICD-10-CM | POA: Diagnosis not present

## 2019-05-02 DIAGNOSIS — K2100 Gastro-esophageal reflux disease with esophagitis, without bleeding: Secondary | ICD-10-CM

## 2019-05-02 MED ORDER — PANTOPRAZOLE SODIUM 40 MG PO TBEC: 40.0000 mg | DELAYED_RELEASE_TABLET | Freq: Every day | ORAL | 1 refills | Status: DC

## 2019-05-02 NOTE — Patient Instructions (Signed)
Low-Purine Eating Plan A low-purine eating plan involves making food choices to limit your intake of purine. Purine is a kind of uric acid. Too much uric acid in your blood can cause certain conditions, such as gout and kidney stones. Eating a low-purine diet can help control these conditions. What are tips for following this plan? Reading food labels   Avoid foods with saturated or Trans fat.  Check the ingredient list of grains-based foods, such as bread and cereal, to make sure that they contain whole grains.  Check the ingredient list of sauces or soups to make sure they do not contain meat or fish.  When choosing soft drinks, check the ingredient list to make sure they do not contain high-fructose corn syrup. Shopping  Buy plenty of fresh fruits and vegetables.  Avoid buying canned or fresh fish.  Buy dairy products labeled as low-fat or nonfat.  Avoid buying premade or processed foods. These foods are often high in fat, salt (sodium), and added sugar. Cooking  Use olive oil instead of butter when cooking. Oils like olive oil, canola oil, and sunflower oil contain healthy fats. Meal planning  Learn which foods do or do not affect you. If you find out that a food tends to cause your gout symptoms to flare up, avoid eating that food. You can enjoy foods that do not cause problems. If you have any questions about a food item, talk with your dietitian or health care provider.  Limit foods high in fat, especially saturated fat. Fat makes it harder for your body to get rid of uric acid.  Choose foods that are lower in fat and are lean sources of protein. General guidelines  Limit alcohol intake to no more than 1 drink a day for nonpregnant women and 2 drinks a day for men. One drink equals 12 oz of beer, 5 oz of wine, or 1 oz of hard liquor. Alcohol can affect the way your body gets rid of uric acid.  Drink plenty of water to keep your urine clear or pale yellow. Fluids can help  remove uric acid from your body.  If directed by your health care provider, take a vitamin C supplement.  Work with your health care provider and dietitian to develop a plan to achieve or maintain a healthy weight. Losing weight can help reduce uric acid in your blood. What foods are recommended? The items listed may not be a complete list. Talk with your dietitian about what dietary choices are best for you. Foods low in purines Foods low in purines do not need to be limited. These include:  All fruits.  All low-purine vegetables, pickles, and olives.  Breads, pasta, rice, cornbread, and popcorn. Cake and other baked goods.  All dairy foods.  Eggs, nuts, and nut butters.  Spices and condiments, such as salt, herbs, and vinegar.  Plant oils, butter, and margarine.  Water, sugar-free soft drinks, tea, coffee, and cocoa.  Vegetable-based soups, broths, sauces, and gravies. Foods moderate in purines Foods moderate in purines should be limited to the amounts listed.   cup of asparagus, cauliflower, spinach, mushrooms, or green peas, each day.  2/3 cup uncooked oatmeal, each day.   cup dry wheat bran or wheat germ, each day.  2-3 ounces of meat or poultry, each day.  4-6 ounces of shellfish, such as crab, lobster, oysters, or shrimp, each day.  1 cup cooked beans, peas, or lentils, each day.  Soup, broths, or bouillon made from meat or   fish. Limit these foods as much as possible. What foods are not recommended? The items listed may not be a complete list. Talk with your dietitian about what dietary choices are best for you. Limit your intake of foods high in purines, including:  Beer and other alcohol.  Meat-based gravy or sauce.  Canned or fresh fish, such as: ? Anchovies, sardines, herring, and tuna. ? Mussels and scallops. ? Codfish, trout, and haddock.  Bacon.  Organ meats, such as: ? Liver or kidney. ? Tripe. ? Sweetbreads (thymus gland or  pancreas).  Wild game or goose.  Yeast or yeast extract supplements.  Drinks sweetened with high-fructose corn syrup. Summary  Eating a low-purine diet can help control conditions caused by too much uric acid in the body, such as gout or kidney stones.  Choose low-purine foods, limit alcohol, and limit foods high in fat.  You will learn over time which foods do or do not affect you. If you find out that a food tends to cause your gout symptoms to flare up, avoid eating that food. This information is not intended to replace advice given to you by your health care provider. Make sure you discuss any questions you have with your health care provider. Document Revised: 03/17/2017 Document Reviewed: 05/18/2016 Elsevier Patient Education  2020 [employer] Gout  Gout is painful swelling of your joints. Gout is a type of arthritis. It is caused by having too much uric acid in your body. Uric acid is a chemical that is made when your body breaks down substances called purines. If your body has too much uric acid, sharp crystals can form and build up in your joints. This causes pain and swelling. Gout attacks can happen quickly and be very painful (acute gout). Over time, the attacks can affect more joints and happen more often (chronic gout). What are the causes?  Too much uric acid in your blood. This can happen because: ? Your kidneys do not remove enough uric acid from your blood. ? Your body makes too much uric acid. ? You eat too many foods that are high in purines. These foods include organ meats, some seafood, and beer.  Trauma or stress. What increases the risk?  Having a family history of gout.  Being male and middle-aged.  Being male and having gone through menopause.  Being very overweight (obese).  Drinking alcohol, especially beer.  Not having enough water in the body (being dehydrated).  Losing weight too quickly.  Having an organ transplant.  Having lead  poisoning.  Taking certain medicines.  Having kidney disease.  Having a skin condition called psoriasis. What are the signs or symptoms? An attack of acute gout usually happens in just one joint. The most common place is the big toe. Attacks often start at night. Other joints that may be affected include joints of the feet, ankle, knee, fingers, wrist, or elbow. Symptoms of an attack may include:  Very bad pain.  Warmth.  Swelling.  Stiffness.  Shiny, red, or purple skin.  Tenderness. The affected joint may be very painful to touch.  Chills and fever. Chronic gout may cause symptoms more often. More joints may be involved. You may also have white or yellow lumps (tophi) on your hands or feet or in other areas near your joints. How is this treated?  Treatment for this condition has two phases: treating an acute attack and preventing future attacks.  Acute gout treatment may include: ? NSAIDs. ? Steroids. These are   taken by mouth or injected into a joint. ? Colchicine. This medicine relieves pain and swelling. It can be given by mouth or through an IV tube.  Preventive treatment may include: ? Taking small doses of NSAIDs or colchicine daily. ? Using a medicine that reduces uric acid levels in your blood. ? Making changes to your diet. You may need to see a food expert (dietitian) about what to eat and drink to prevent gout. Follow these instructions at home: During a gout attack   If told, put ice on the painful area: ? Put ice in a plastic bag. ? Place a towel between your skin and the bag. ? Leave the ice on for 20 minutes, 2-3 times a day.  Raise (elevate) the painful joint above the level of your heart as often as you can.  Rest the joint as much as possible. If the joint is in your leg, you may be given crutches.  Follow instructions from your doctor about what you cannot eat or drink. Avoiding future gout attacks  Eat a low-purine diet. Avoid foods and drinks  such as: ? Liver. ? Kidney. ? Anchovies. ? Asparagus. ? Herring. ? Mushrooms. ? Mussels. ? Beer.  Stay at a healthy weight. If you want to lose weight, talk with your doctor. Do not lose weight too fast.  Start or continue an exercise plan as told by your doctor. Eating and drinking  Drink enough fluids to keep your pee (urine) pale yellow.  If you drink alcohol: ? Limit how much you use to:  0-1 drink a day for women.  0-2 drinks a day for men. ? Be aware of how much alcohol is in your drink. In the [place] one drink equals one 12 oz bottle of beer (355 mL), one 5 oz glass of wine (148 mL), or one 1 oz glass of hard liquor (44 mL). General instructions  Take over-the-counter and prescription medicines only as told by your doctor.  Do not drive or use heavy machinery while taking prescription pain medicine.  Return to your normal activities as told by your doctor. Ask your doctor what activities are safe for you.  Keep all follow-up visits as told by your doctor. This is important. Contact a doctor if:  You have another gout attack.  You still have symptoms of a gout attack after 10 days of treatment.  You have problems (side effects) because of your medicines.  You have chills or a fever.  You have burning pain when you pee (urinate).  You have pain in your lower back or belly. Get help right away if:  You have very bad pain.  Your pain cannot be controlled.  You cannot pee. Summary  Gout is painful swelling of the joints.  The most common site of pain is the big toe, but it can affect other joints.  Medicines and avoiding some foods can help to prevent and treat gout attacks. This information is not intended to replace advice given to you by your health care provider. Make sure you discuss any questions you have with your health care provider. Document Revised: 10/25/2017 Document Reviewed: 10/25/2017 Elsevier Patient Education  2020 [employer]  

## 2019-05-02 NOTE — Progress Notes (Signed)
Patient ID: Donald [name], male    DOB: [birth year], 39 y.o.   MRN: [id number]  PCP: Danelle Berry, PA-C  Chief Complaint  Patient presents with  . Follow-up  . paperwork    FMLA gout  . Gastroesophageal Reflux    needs refill    Subjective:   Donald [name] is a 39 y.o. male, presents to clinic with CC of the following:  HPI  Patient presents to clinic for follow-up on gout and acid reflux also needs FMLA paperwork filled out again for his job. Patient is new to me, transfer of care from NP and MD that retired Charity fundraiser left the practice few months ago. Reviewed their past office visits and history of idiopathic chronic gout and reviewed past FMLA paperwork  He has history of left 1st MTP gout recurrent.  He reports having about 5 flares in the past 6 months.  He has FMLA paperwork with him, has had it done for at least the past 1-2 years here at this same clinic with 2 past PCP's. Last FMLA only excused him once every 2 months and he is having more gout flares than that. He is on allopurinol 300 mg once daily, last labs were over 18 months ago.  He is eating whatever he wants including meats but he did have gastric sleeve surgery and he eats small portions.  He is compliant with his medications. Lab Results  Component Value Date   LABURIC 6.6 05/02/2019   Renal function recent labs:  Lab Results  Component Value Date   GFRAA 82 09/01/2017   GFRAA 102 08/03/2016   GFRAA 94 05/19/2016    Lab Results  Component Value Date   CREATININE 1.18 05/02/2019   BUN 16 05/02/2019   NA 140 05/02/2019   K 4.4 05/02/2019   CL 103 05/02/2019   CO2 33 (H) 05/02/2019    He also has hx of HLD and prediabetes, last labs for A1C were in 2017  Protonix for acid reflux s/p sleeve gastrectomy, patient does have frequent symptoms of acid reflux burning belching indigestion, his symptoms have started after his sleeve gastrectomy.  He denies any abdominal bloating, melena, hematochezia,  change in bowel movements    Patient's other lab work has not been done for over 3 years, discussed doing lab work today.  He states that most of his problems have improved because of his gastric sleeve surgery but in reviewing care everywhere labs there is still no recent lab work done including A1c for over a year or 2.   Patient Active Problem List   Diagnosis Date Noted  . Medication monitoring encounter 05/31/2016  . Hypertriglyceridemia 03/21/2016  . S/P laparoscopic sleeve gastrectomy 03/21/2016  . Preventative health care 09/10/2015  . Family hx of prostate cancer 09/10/2015  . Situational anxiety 06/26/2015  . Pre-diabetes 02/20/2015  . 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 Hyperlipidemia LDL goal <100 01/27/2015      Current Outpatient Medications:  .  allopurinol (ZYLOPRIM) 300 MG tablet, Take 1 tablet (300 mg total) by mouth at bedtime. Take with 100 mg dose for total 400 mg daily dose, Disp: 90 tablet, Rfl: 1 .  chlorhexidine (PERIDEX) 0.12 % solution, 0.12 mLs by Mouth Rinse route 2 (two) times daily., Disp: 120 mL, Rfl: 6 .  allopurinol (ZYLOPRIM) 100 MG tablet, Take 1 tablet (100 mg total) by mouth at bedtime. Take with 300 mg tab for 400 mg total dose daily,  Disp: 90 tablet, Rfl: 1 .  Calcium 500 MG CHEW, Chew by mouth 3 (three) times daily., Disp: , Rfl:  .  indomethacin (INDOCIN) 25 MG capsule, Take 50mg  PO TID x 2d, then 25 mg PO TID x 3 d, take PRN at onset of gout symptoms, Disp: 30 capsule, Rfl: 2 .  Multiple Vitamin (MULTIVITAMIN) tablet, Take 1 tablet by mouth daily., Disp: , Rfl:  .  pantoprazole (PROTONIX) 40 MG tablet, Take 1 tablet (40 mg total) by mouth daily., Disp: 90 tablet, Rfl: 1   No Known Allergies   Family History  Problem Relation Age of Onset  . Hyperlipidemia Mother   . Diabetes Mother   . Diabetes Father   . Prostate cancer Father   . Prostate cancer Paternal Grandfather      Social History    Socioeconomic History  . Marital status: Married    Spouse name:  . Number of children: 2  . Years of education: Not on file  . Highest education level: Some college, no degree  Occupational History  . Not on file  Tobacco Use  . Smoking status: Former Smoker    Types: Cigarettes  . Smokeless tobacco: Never Used  . Tobacco comment: smokes at social events rarely  Substance and Sexual Activity  . Alcohol use: Yes    Alcohol/week: 0.0 - 1.0 standard drinks    Comment: socially  . Drug use: No  . Sexual activity: Yes    Partners: Female  Other Topics Concern  . Not on file  Social History Narrative  . Not on file   Social Determinants of Health   Financial Resource Strain: Low Risk   . Difficulty of Paying Living Expenses: Not hard at all  Food Insecurity: No Food Insecurity  . Worried About Benjiman Core in the Last Year: Never true  . Ran Out of Food in the Last Year: Never true  Transportation Needs: No Transportation Needs  . Lack of Transportation (Medical): No  . Lack of Transportation (Non-Medical): No  Physical Activity: Sufficiently Active  . Days of Exercise per Week: 5 days  . Minutes of Exercise per Session: 30 min  Stress: No Stress Concern Present  . Feeling of Stress : Not at all  Social Connections: Not Isolated  . Frequency of Communication with Friends and Family: More than three times a week  . Frequency of Social Gatherings with Friends and Family: Twice a week  . Attends Religious Services: More than 4 times per year  . Active Member of Clubs or Organizations: Yes  . Attends Programme researcher, broadcasting/film/video Meetings: More than 4 times per year  . Marital Status: Married  Banker Violence: Not At Risk  . Fear of Current or Ex-Partner: No  . Emotionally Abused: No  . Physically Abused: No  . Sexually Abused: No    Chart Review Today: I personally reviewed active problem list, medication list, allergies, family history, social  history, health maintenance, notes from last encounter, lab results, imaging with the patient/caregiver today.   Review of Systems  Constitutional: Negative.   HENT: Negative.   Eyes: Negative.   Respiratory: Negative.   Cardiovascular: Negative.   Gastrointestinal: Negative.   Endocrine: Negative.   Genitourinary: Negative.   Musculoskeletal: Negative.   Skin: Negative.   Allergic/Immunologic: Negative.   Neurological: Negative.   Hematological: Negative.   Psychiatric/Behavioral: Negative.   All other systems reviewed and are negative.      Objective:  Vitals:   05/02/19 0851  Pulse: 77  Resp: 12  Temp: (!) 96.1 F (35.6 C)  SpO2: 96%  Weight: 241 lb 3.2 oz (109.4 kg)  Height: 6\' 4"  (1.93 m)    Body mass index is 29.36 kg/m.  Physical Exam Vitals and nursing note reviewed.  Constitutional:      General: He is not in acute distress.    Appearance: Normal appearance. He is well-developed. He is not ill-appearing, toxic-appearing or diaphoretic.     Interventions: Face mask in place.  HENT:     Head: Normocephalic and atraumatic.     Jaw: No trismus.     Right Ear: External ear normal.     Left Ear: External ear normal.  Eyes:     General: Lids are normal. No scleral icterus.    Conjunctiva/sclera: Conjunctivae normal.     Pupils: Pupils are equal, round, and reactive to light.  Neck:     Trachea: Trachea and phonation normal. No tracheal deviation.  Cardiovascular:     Rate and Rhythm: Normal rate and regular rhythm.     Pulses: Normal pulses.          Radial pulses are 2+ on the right side and 2+ on the left side.       Posterior tibial pulses are 2+ on the right side and 2+ on the left side.     Heart sounds: Normal heart sounds. No murmur. No friction rub. No gallop.   Pulmonary:     Effort: Pulmonary effort is normal. No respiratory distress.     Breath sounds: Normal breath sounds. No stridor. No wheezing, rhonchi or rales.  Abdominal:      General: Bowel sounds are normal. There is no distension.     Palpations: Abdomen is soft.     Tenderness: There is no abdominal tenderness. There is no guarding or rebound.  Musculoskeletal:        General: Normal range of motion.     Cervical back: Normal range of motion and neck supple.     Right lower leg: No edema.     Left lower leg: No edema.  Skin:    General: Skin is warm and dry.     Capillary Refill: Capillary refill takes less than 2 seconds.     Coloration: Skin is not jaundiced.     Findings: No rash.     Nails: There is no clubbing.  Neurological:     Mental Status: He is alert.     Cranial Nerves: No dysarthria or facial asymmetry.     Motor: No tremor or abnormal muscle tone.     Gait: Gait normal.  Psychiatric:        Mood and Affect: Mood normal.        Speech: Speech normal.        Behavior: Behavior normal. Behavior is cooperative.      Results for orders placed or performed in visit on 05/02/19  Lipid panel  Result Value Ref Range   Cholesterol 207 (H) <200 mg/dL   HDL 59 > OR = 40 mg/dL   Triglycerides 73 05/04/19 mg/dL   LDL Cholesterol (Calc) 131 (H) mg/dL (calc)   Total CHOL/HDL Ratio 3.5 <5.0 (calc)   Non-HDL Cholesterol (Calc) 148 (H) <130 mg/dL (calc)  Comprehensive metabolic panel  Result Value Ref Range   Glucose, Bld 74 65 - 99 mg/dL   BUN 16 7 - 25 mg/dL   Creat <010 9.32 - 3.55  mg/dL   BUN/Creatinine Ratio NOT APPLICABLE 6 - 22 (calc)   Sodium 140 135 - 146 mmol/L   Potassium 4.4 3.5 - 5.3 mmol/L   Chloride 103 98 - 110 mmol/L   CO2 33 (H) 20 - 32 mmol/L   Calcium 9.5 8.6 - 10.3 mg/dL   Total Protein 6.9 6.1 - 8.1 g/dL   Albumin 4.3 3.6 - 5.1 g/dL   Globulin 2.6 1.9 - 3.7 g/dL (calc)   AG Ratio 1.7 1.0 - 2.5 (calc)   Total Bilirubin 0.6 0.2 - 1.2 mg/dL   Alkaline phosphatase (APISO) 60 36 - 130 U/L   AST 13 10 - 40 U/L   ALT 14 9 - 46 U/L  Hemoglobin A1c  Result Value Ref Range   Hgb A1c MFr Bld 5.4 <5.7 % of total Hgb   Mean  Plasma Glucose 108 (calc)   eAG (mmol/L) 6.0 (calc)  Uric acid  Result Value Ref Range   Uric Acid, Serum 6.6 4.0 - 8.0 mg/dL        Assessment & Plan:      ICD-10-CM   1. 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 tophus  M1A.52X0 Uric acid    CMET - [facility]    Comprehensive metabolic panel    Uric acid    allopurinol (ZYLOPRIM) 100 MG tablet    allopurinol (ZYLOPRIM) 300 MG tablet    indomethacin (INDOCIN) 25 MG capsule    CANCELED: Uric acid    CANCELED: CMET - [facility]   check uric acid level, adjust allopurinol to get uric acid less than 6.0, add indomethacin if kidney function is good -FMLA completed today  2. Pre-diabetes  R73.03 A1C    Comprehensive metabolic panel    Hemoglobin A1c    CANCELED: A1C   He has had weight loss, generally eats what he wants but small portions, will recheck labs  3. Hyperlipidemia LDL goal <100  E78.5 Lipid Panel    CMET - [facility]    Lipid panel    Comprehensive metabolic panel    CANCELED: CMET - [facility]    CANCELED: Lipid Panel   Weight loss, no recent labs will recheck lipid panel  4. S/P laparoscopic sleeve gastrectomy  Z98.84   5. Gastroesophageal reflux disease with esophagitis, unspecified whether hemorrhage  K21.00 pantoprazole (PROTONIX) 40 MG tablet   Protonix 40 mg daily in the morning before breakfast, follow-up if symptoms not controlled or worsening may need GI        Delsa Grana, PA-C 05/03/19 3:41 PM

## 2019-05-03 LAB — COMPREHENSIVE METABOLIC PANEL
AG Ratio: 1.7 (calc) (ref 1.0–2.5)
ALT: 14 U/L (ref 9–46)
AST: 13 U/L (ref 10–40)
Albumin: 4.3 g/dL (ref 3.6–5.1)
Alkaline phosphatase (APISO): 60 U/L (ref 36–130)
BUN: 16 mg/dL (ref 7–25)
CO2: 33 mmol/L — ABNORMAL HIGH (ref 20–32)
Calcium: 9.5 mg/dL (ref 8.6–10.3)
Chloride: 103 mmol/L (ref 98–110)
Creat: 1.18 mg/dL (ref 0.60–1.35)
Globulin: 2.6 g/dL (calc) (ref 1.9–3.7)
Glucose, Bld: 74 mg/dL (ref 65–99)
Potassium: 4.4 mmol/L (ref 3.5–5.3)
Sodium: 140 mmol/L (ref 135–146)
Total Bilirubin: 0.6 mg/dL (ref 0.2–1.2)
Total Protein: 6.9 g/dL (ref 6.1–8.1)

## 2019-05-03 LAB — LIPID PANEL
Cholesterol: 207 mg/dL — ABNORMAL HIGH (ref <200)
HDL: 59 mg/dL (ref >=40)
LDL Cholesterol (Calc): 131 mg/dL (calc) — ABNORMAL HIGH
Non-HDL Cholesterol (Calc): 148 mg/dL (calc) — ABNORMAL HIGH (ref <130)
Total CHOL/HDL Ratio: 3.5 (calc) (ref <5.0)
Triglycerides: 73 mg/dL (ref <150)

## 2019-05-03 LAB — URIC ACID: Uric Acid, Serum: 6.6 mg/dL (ref 4.0–8.0)

## 2019-05-03 LAB — HEMOGLOBIN A1C
Hgb A1c MFr Bld: 5.4 % of total Hgb (ref <5.7)
Mean Plasma Glucose: 108 (calc)
eAG (mmol/L): 6 (calc)

## 2019-05-03 MED ORDER — ALLOPURINOL 100 MG PO TABS: 100.0000 mg | ORAL_TABLET | Freq: Every day | ORAL | 1 refills | Status: DC

## 2019-05-03 MED ORDER — INDOMETHACIN 25 MG PO CAPS: ORAL_CAPSULE | ORAL | 2 refills | Status: DC

## 2019-05-03 MED ORDER — ALLOPURINOL 300 MG PO TABS: 300.0000 mg | ORAL_TABLET | Freq: Every day | ORAL | 1 refills | Status: DC

## 2019-10-02 ENCOUNTER — Encounter: Payer: Self-pay | Admitting: Family Medicine

## 2019-11-05 ENCOUNTER — Ambulatory Visit (INDEPENDENT_AMBULATORY_CARE_PROVIDER_SITE_OTHER): Payer: 59 | Admitting: Family Medicine

## 2019-11-05 ENCOUNTER — Other Ambulatory Visit: Payer: Self-pay

## 2019-11-05 ENCOUNTER — Encounter: Payer: Self-pay | Admitting: Family Medicine

## 2019-11-05 VITALS — BP 120/82 | HR 76 | Temp 97.5°F | Resp 18 | Ht 76.0 in | Wt 230.0 lb

## 2019-11-05 DIAGNOSIS — Z1159 Encounter for screening for other viral diseases: Secondary | ICD-10-CM | POA: Diagnosis not present

## 2019-11-05 DIAGNOSIS — R7303 Prediabetes: Secondary | ICD-10-CM | POA: Diagnosis not present

## 2019-11-05 DIAGNOSIS — Z0000 Encounter for general adult medical examination without abnormal findings: Secondary | ICD-10-CM

## 2019-11-05 DIAGNOSIS — E785 Hyperlipidemia, unspecified: Secondary | ICD-10-CM | POA: Diagnosis not present

## 2019-11-05 DIAGNOSIS — M109 Gout, unspecified: Secondary | ICD-10-CM

## 2019-11-05 MED ORDER — PREDNISONE 20 MG PO TABS: ORAL_TABLET | ORAL | 0 refills | Status: DC

## 2019-11-05 MED ORDER — ALLOPURINOL 300 MG PO TABS: ORAL_TABLET | ORAL | 3 refills | Status: DC

## 2019-11-05 NOTE — Progress Notes (Signed)
Patient: Donald [name], Male    DOB: 10/13/1980, 39 y.o.   MRN: [id number] Donald [name], Philippa Vessey, PA-C Visit Date: 11/05/2019  Today's Provider: [name], PA-C   Chief Complaint  Patient presents with  . Annual Exam   Subjective:   Annual physical exam:  Donald BergerHayward Dowse [name] is a 39 y.o. male who presents today for health maintenance and annual & complete physical exam.  He feels well.  He reports exercising just for work, walks Diet - balanced smaller portions s/p sleeve gastrectomy He reports he is sleeping well. He previously had sleeve gastrectomy and went from 360 to 230, he eats healthy and smaller meals, he started taking otc vitamins, he wanted to work on health, taking Multivitamin, calcium, Vit D - he hasn't gotten sick lately despite family members frequently and recently sick  HLD -  LDL high not on meds, previous cholesterol not very high  Prediabetes -   Hx of prediabetes last labs were normal A1C Lab Results  Component Value Date   HGBA1C 5.4 05/02/2019   Gout -  Not at goal with last labs, did not increase allopurinol due to cost of meds - still taking 300 mg daily Lab Results  Component Value Date   LABURIC 6.6 05/02/2019     USPSTF grade A and B recommendations - reviewed and addressed today  Depression:  Phq 9 completed today by patient, was reviewed by me with patient in the room, score is  negative, pt feels good PHQ 2/9 Scores 11/05/2019 05/02/2019 09/03/2018 10/31/2017  PHQ - 2 Score 0 0 0 0  PHQ- 9 Score 0 0 - -   [facility] 11/05/2019 05/02/2019 09/03/2018 10/31/2017 09/01/2017  Decreased Interest 0 0 0 0 0  Down, Depressed, Hopeless 0 0 0 0 0  PHQ - 2 Score 0 0 0 0 0  Altered sleeping 0 0 - - -  Tired, decreased energy 0 0 - - -  Change in appetite 0 0 - - -  Feeling bad or failure about yourself  0 0 - - -  Trouble concentrating 0 0 - - -  Moving slowly or fidgety/restless 0 0 - - -  Suicidal thoughts 0 0 - - -  PHQ-9 Score 0 0 -  - -  Difficult doing work/chores Not difficult at all Not difficult at all - - -    Hep C Screening: will do today STD testing and prevention (HIV/chl/gon/syphilis): HIV done previously, declines any additional testing Prostate cancer: father and grandfather, no current sx for pt, no past PSA testing or urology visits No results found for: PSA Intimate partner violence: denies  Urinary Symptoms:  IPSS Questionnaire (AUA-7): Over the past month.   1)  How often have you had a sensation of not emptying your bladder completely after you finish urinating?  0 - Not at all  2)  How often have you had to urinate again less than two hours after you finished urinating? 0 - Not at all  3)  How often have you found you stopped and started again several times when you urinated?  0 - Not at all  4) How difficult have you found it to postpone urination?  0 - Not at all  5) How often have you had a weak urinary stream?  0 - Not at all  6) How often have you had to push or strain to begin urination?  0 - Not at all  7) How many  times did you most typically get up to urinate from the time you went to bed until the time you got up in the morning?  0 - None  Total score:  0-7 mildly symptomatic   8-19 moderately symptomatic   20-35 severely symptomatic    Skin cancer:   Pt reports noi hx of skin cancer, suspicious lesions/biopsies in the past.  Colorectal cancer:  colonoscopy is not due per age  Lung cancer:   Low Dose CT Chest recommended if Age 11-80 years, 30 pack-year currently smoking OR have quit w/in 15years. Patient does not qualify.   Social History   Tobacco Use  . Smoking status: Former Smoker    Types: Cigarettes  . Smokeless tobacco: Never Used  . Tobacco comment: smokes at social events rarely  Substance Use Topics  . Alcohol use: Yes    Alcohol/week: 0.0 - 1.0 standard drinks    Comment: socially     Alcohol screening:   [facility]/05/[facility]  AUDIT-C Score 1      AAA: n/a The USPSTF recommends one-time screening with ultrasonography in men ages 65 to 5 years who have ever smoked  Blood pressure/Hypertension: BP Readings from Last 3 Encounters:  11/05/19 120/82  05/02/19 112/72  01/24/18 (!) 146/99   Weight/Obesity: Wt Readings from Last 3 Encounters:  11/05/19 230 lb (104.3 kg)  05/02/19 241 lb 3.2 oz (109.4 kg)  09/03/18 222 lb (100.7 kg)   BMI Readings from Last 3 Encounters:  11/05/19 28.00 kg/m  05/02/19 29.36 kg/m  09/03/18 27.02 kg/m    Lipids:  Lab Results  Component Value Date   CHOL 207 (H) 05/02/2019   CHOL 217 (H) 02/01/2016   CHOL 176 09/10/2015   Lab Results  Component Value Date   HDL 59 05/02/2019   HDL 48 02/01/2016   HDL 52 09/10/2015   Lab Results  Component Value Date   LDLCALC 131 (H) 05/02/2019   LDLCALC 105 02/01/2016   LDLCALC 104 (H) 09/10/2015   Lab Results  Component Value Date   TRIG 73 05/02/2019   TRIG 318 (H) 02/01/2016   TRIG 99 09/10/2015   Lab Results  Component Value Date   CHOLHDL 3.5 05/02/2019   CHOLHDL 4.5 02/01/2016   CHOLHDL 4.2 02/20/2015   No results found for: LDLDIRECT Based on the results of lipid panel his/her cardiovascular risk factor ( using [place] Cohort )  in the next 10 years is : The ASCVD Risk score Denman George DC Jr., et al., 2013) failed to calculate for the following reasons:   The 2013 ASCVD risk score is only valid for ages 46 to 65 Glucose:  Glucose  Date Value Ref Range Status  09/01/2017 76 65 - 99 mg/dL Final  94/49/6759 78 65 - 99 mg/dL Final  16/38/4665 81 65 - 99 mg/dL Final   Glucose, Bld  Date Value Ref Range Status  05/02/2019 74 65 - 99 mg/dL Final    Comment:    .            Fasting reference interval .   03/22/2016 115 (H) 65 - 99 mg/dL Final  99/35/7017 88 65 - 99 mg/dL Final   Glucose-Capillary  Date Value Ref Range Status  03/21/2016 159 (H) 65 - 99 mg/dL Final    Social History      He   reports that he has quit smoking. His smoking use included cigarettes. He has never used smokeless tobacco.  He reports current alcohol use. He reports that he does not use drugs.       Social History   Socioeconomic History  . Marital status: Married    Spouse name: Benjiman Core  . Number of children: 2  . Years of education: Not on file  . Highest education level: Some college, no degree  Occupational History  . Not on file  Tobacco Use  . Smoking status: Former Smoker    Types: Cigarettes  . Smokeless tobacco: Never Used  . Tobacco comment: smokes at social events rarely  Vaping Use  . Vaping Use: Never used  Substance and Sexual Activity  . Alcohol use: Yes    Alcohol/week: 0.0 - 1.0 standard drinks    Comment: socially  . Drug use: No  . Sexual activity: Yes    Partners: Female  Other Topics Concern  . Not on file  Social History Narrative  . Not on file   Social Determinants of Health   Financial Resource Strain:   . Difficulty of Paying Living Expenses:   Food Insecurity:   . Worried About Programme researcher, broadcasting/film/video in the Last Year:   . Barista in the Last Year:   Transportation Needs:   . Freight forwarder (Medical):   Marland Kitchen Lack of Transportation (Non-Medical):   Physical Activity:   . Days of Exercise per Week:   . Minutes of Exercise per Session:   Stress:   . Feeling of Stress :   Social Connections:   . Frequency of Communication with Friends and Family:   . Frequency of Social Gatherings with Friends and Family:   . Attends Religious Services:   . Active Member of Clubs or Organizations:   . Attends Banker Meetings:   Marland Kitchen Marital Status:            Family History        Family Status  Relation Name Status  . Mother  Alive  . Father  Alive  . Brother  Alive  . Daughter  Alive  . MGM  Alive  . MGF  Deceased  . PGM  Alive  . PGF  Alive  . Son  Alive        His family history includes Diabetes in his father and mother;  Hyperlipidemia in his mother; Prostate cancer in his father and paternal grandfather.       Family History  Problem Relation Age of Onset  . Hyperlipidemia Mother   . Diabetes Mother   . Diabetes Father   . Prostate cancer Father   . Prostate cancer Paternal Grandfather     Patient Active Problem List   Diagnosis Date Noted  . Medication monitoring encounter 05/31/2016  . Hypertriglyceridemia 03/21/2016  . S/P laparoscopic sleeve gastrectomy 03/21/2016  . Preventative health care 09/10/2015  . Family hx of prostate cancer 09/10/2015  . Situational anxiety 06/26/2015  . Pre-diabetes 02/20/2015  . 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 Hyperlipidemia LDL goal <100 01/27/2015    Past Surgical History:  Procedure Laterality Date  . LAPAROSCOPIC GASTRIC SLEEVE RESECTION N/A 03/21/2016   Procedure: LAPAROSCOPIC GASTRIC SLEEVE RESECTION, UPPER ENDO;  Surgeon: Gaynelle Adu, MD;  Location: [facility];  Service: General;  Laterality: N/A;  . WISDOM TOOTH EXTRACTION       Current Outpatient Medications:  .  allopurinol (ZYLOPRIM) 100 MG tablet, Take 1 tablet (100 mg total) by mouth at bedtime. Take with 300  mg tab for 400 mg total dose daily, Disp: 90 tablet, Rfl: 1 .  allopurinol (ZYLOPRIM) 300 MG tablet, Take 1 tablet (300 mg total) by mouth at bedtime. Take with 100 mg dose for total 400 mg daily dose, Disp: 90 tablet, Rfl: 1 .  Calcium 500 MG CHEW, Chew by mouth 3 (three) times daily., Disp: , Rfl:  .  chlorhexidine (PERIDEX) 0.12 % solution, 0.12 mLs by Mouth Rinse route 2 (two) times daily., Disp: 120 mL, Rfl: 6 .  indomethacin (INDOCIN) 25 MG capsule, Take 50mg  PO TID x 2d, then 25 mg PO TID x 3 d, take PRN at onset of gout symptoms, Disp: 30 capsule, Rfl: 2 .  Multiple Vitamin (MULTIVITAMIN) tablet, Take 1 tablet by mouth daily., Disp: , Rfl:  .  pantoprazole (PROTONIX) 40 MG tablet, Take 1 tablet (40 mg total) by mouth daily., Disp: 90 tablet, Rfl:  1  No Known Allergies  Patient Care Team: , PA-C as PCP - General (Family Medicine) Donald Berry, MD as Consulting Physician (General Surgery)  I personally reviewed active problem list, medication list, allergies, family history, social history, health maintenance, notes from last encounter, lab results, imaging with the patient/caregiver today.   Review of Systems  Constitutional: Negative.  Negative for activity change, appetite change, fatigue and unexpected weight change.  HENT: Negative.   Eyes: Negative.   Respiratory: Negative.  Negative for shortness of breath.   Cardiovascular: Negative.  Negative for chest pain, palpitations and leg swelling.  Gastrointestinal: Negative.  Negative for abdominal pain and blood in stool.  Endocrine: Negative.   Genitourinary: Negative.  Negative for decreased urine volume, difficulty urinating, testicular pain and urgency.  Skin: Negative.  Negative for color change and pallor.  Allergic/Immunologic: Negative.   Neurological: Negative.  Negative for syncope, weakness, light-headedness and numbness.  Psychiatric/Behavioral: Negative.  Negative for confusion, dysphoric mood, self-injury and suicidal ideas. The patient is not nervous/anxious.   All other systems reviewed and are negative.         Objective:   Vitals:  Vitals:   11/05/19 0900  BP: 120/82  Pulse: 76  Resp: 18  Temp: (!) 97.5 F (36.4 C)  TempSrc: Temporal  SpO2: 98%  Weight: 230 lb (104.3 kg)  Height: 6\' 4"  (1.93 m)    Body mass index is 28 kg/m.  Physical Exam Constitutional:      General: He is not in acute distress.    Appearance: Normal appearance. He is well-developed. He is not ill-appearing or toxic-appearing.  HENT:     Head: Normocephalic and atraumatic.     Jaw: No trismus.     Right Ear: Tympanic membrane, ear canal and external ear normal.     Left Ear: Tympanic membrane, ear canal and external ear normal.     Nose: No mucosal edema  or rhinorrhea.     Right Sinus: No maxillary sinus tenderness or frontal sinus tenderness.     Left Sinus: No maxillary sinus tenderness or frontal sinus tenderness.     Mouth/Throat:     Pharynx: Uvula midline. No oropharyngeal exudate, posterior oropharyngeal erythema or uvula swelling.  Eyes:     General: Lids are normal.     Conjunctiva/sclera: Conjunctivae normal.     Pupils: Pupils are equal, round, and reactive to light.  Neck:     Trachea: Trachea and phonation normal. No tracheal deviation.  Cardiovascular:     Rate and Rhythm: Regular rhythm.     Pulses: Normal pulses.  Radial pulses are 2+ on the right side and 2+ on the left side.       Posterior tibial pulses are 2+ on the right side and 2+ on the left side.     Heart sounds: Normal heart sounds. No murmur heard.  No friction rub. No gallop.   Pulmonary:     Effort: Pulmonary effort is normal.     Breath sounds: Normal breath sounds. No wheezing, rhonchi or rales.  Abdominal:     General: Bowel sounds are normal. There is no distension.     Palpations: Abdomen is soft.     Tenderness: There is no abdominal tenderness. There is no guarding or rebound.  Musculoskeletal:        General: Normal range of motion.     Cervical back: Normal range of motion and neck supple.  Skin:    General: Skin is warm and dry.     Capillary Refill: Capillary refill takes less than 2 seconds.     Findings: No rash.  Neurological:     Mental Status: He is alert and oriented to person, place, and time.     Gait: Gait normal.  Psychiatric:        Speech: Speech normal.        Behavior: Behavior normal.      No results found for this or any previous visit (from the past 2160 hour(s)).   PHQ2/9: Depression screen [facility] 2/9 11/05/2019 05/02/2019 09/03/2018 10/31/2017 09/01/2017  Decreased Interest 0 0 0 0 0  Down, Depressed, Hopeless 0 0 0 0 0  PHQ - 2 Score 0 0 0 0 0  Altered sleeping 0 0 - - -  Tired, decreased energy 0 0 - - -   Change in appetite 0 0 - - -  Feeling bad or failure about yourself  0 0 - - -  Trouble concentrating 0 0 - - -  Moving slowly or fidgety/restless 0 0 - - -  Suicidal thoughts 0 0 - - -  PHQ-9 Score 0 0 - - -  Difficult doing work/chores Not difficult at all Not difficult at all - - -    Fall Risk: Fall Risk  11/05/2019 05/02/2019 09/03/2018 10/31/2017 09/01/2017  Falls in the past year? 0 0 0 No No  Number falls in past yr: 0 0 0 - -  Injury with Fall? 0 0 0 - -  Risk for fall due to : No Fall Risks - - - -  Follow up Falls evaluation completed - - - -    Functional Status Survey: Is the patient deaf or have difficulty hearing?: No Does the patient have difficulty seeing, even when wearing glasses/contacts?: No Does the patient have difficulty concentrating, remembering, or making decisions?: No Does the patient have difficulty walking or climbing stairs?: No Does the patient have difficulty dressing or bathing?: No Does the patient have difficulty doing errands alone such as visiting a doctor's office or shopping?: No   Assessment & Plan:    CPE completed today  . Prostate cancer screening and PSA options (with potential risks and benefits of testing vs not testing) were discussed along with recent recs/guidelines, shared decision making and handout/information given to pt today  . USPSTF grade A and B recommendations reviewed with patient; age-appropriate recommendations, preventive care, screening tests, etc discussed and encouraged; healthy living encouraged; see AVS for patient education given to patient  . Discussed importance of 150 minutes of physical activity weekly, AHA exercise  recommendations given to pt in AVS/handout  . Discussed importance of healthy diet:  eating lean meats and proteins, avoiding trans fats and saturated fats, avoid simple sugars and excessive carbs in diet, eat 6 servings of fruit/vegetables daily and drink plenty of water and avoid sweet  beverages.  DASH diet reviewed if pt has HTN  . Recommended pt to do annual eye exam and routine dental exams/cleanings  . Reviewed Health Maintenance: Health Maintenance  Topic Date Due  . Hepatitis C Screening  Never done  . COVID-19 Vaccine (1) Never done  . INFLUENZA VACCINE  11/17/2019  . TETANUS/TDAP  08/05/2027  . HIV Screening  Completed    . Immunizations: Immunization History  Administered Date(s) Administered  . Influenza,inj,Quad PF,6+ Mos 02/20/2015, 02/01/2016, 02/27/2017, 02/09/2019  . Tdap 10/26/2016, 08/04/2017       ICD-10-CM   1. Adult general medical exam  Z00.00 CBC with Differential/Platelet    Lipid panel    Comprehensive metabolic panel  2. Encounter for hepatitis C screening test for low risk patient  Z11.59 Hepatitis C antibody  3. Hyperlipidemia, unspecified hyperlipidemia type  E78.5 Lipid panel   high cholesterol, recheck - discussed risk, diet and exercise encouraged to lower   4. Pre-diabetes  R73.03    last A1C normal, likely improved and no more prediabetes - can monitor once a year   5. Gout, unspecified cause, unspecified chronicity, unspecified site  M10.9 predniSONE (DELTASONE) 20 MG tablet    allopurinol (ZYLOPRIM) 300 MG tablet    DISCONTINUED: allopurinol (ZYLOPRIM) 300 MG tablet   not well controlled, pt did not increase allopurinol dose due to cost - adjusted med/rx today, steroids to bridge dose increase, f/up as needed    Gout - start allopurinol 300 mg in am and 1/2 tab 150 mg in pm daily - pt to notify us if he wants meds sent to mail order for more affordable med cost    Donald Berry, Cordelia Poche 11/05/19 9:12 AM  [facility]

## 2019-11-05 NOTE — Patient Instructions (Addendum)
Take prednisone for about 2 days then change your allopurinol dose do 300 mg in the morning and 150 mg in the evening  Let me know if you need me to sent it to the mail order pharmacy [facility]   Preventive Care 4-38 Years Old, Male Preventive care refers to lifestyle choices and visits with your health care provider that can promote health and wellness. This includes:  A yearly physical exam. This is also called an annual well check.  Regular dental and eye exams.  Immunizations.  Screening for certain conditions.  Healthy lifestyle choices, such as eating a healthy diet, getting regular exercise, not using drugs or products that contain nicotine and tobacco, and limiting alcohol use. What can I expect for my preventive care visit? Physical exam Your health care provider will check:  Height and weight. These may be used to calculate body mass index (BMI), which is a measurement that tells if you are at a healthy weight.  Heart rate and blood pressure.  Your skin for abnormal spots. Counseling Your health care provider may ask you questions about:  Alcohol, tobacco, and drug use.  Emotional well-being.  Home and relationship well-being.  Sexual activity.  Eating habits.  Work and work Statistician. What immunizations do I need?  Influenza (flu) vaccine  This is recommended every year. Tetanus, diphtheria, and pertussis (Tdap) vaccine  You may need a Td booster every 10 years. Varicella (chickenpox) vaccine  You may need this vaccine if you have not already been vaccinated. Human papillomavirus (HPV) vaccine  If recommended by your health care provider, you may need three doses over 6 months. Measles, mumps, and rubella (MMR) vaccine  You may need at least one dose of MMR. You may also need a second dose. Meningococcal conjugate (MenACWY) vaccine  One dose is recommended if you are 38-25 years old and a Market researcher living in a residence hall, or  if you have one of several medical conditions. You may also need additional booster doses. Pneumococcal conjugate (PCV13) vaccine  You may need this if you have certain conditions and were not previously vaccinated. Pneumococcal polysaccharide (PPSV23) vaccine  You may need one or two doses if you smoke cigarettes or if you have certain conditions. Hepatitis A vaccine  You may need this if you have certain conditions or if you travel or work in places where you may be exposed to hepatitis A. Hepatitis B vaccine  You may need this if you have certain conditions or if you travel or work in places where you may be exposed to hepatitis B. Haemophilus influenzae type b (Hib) vaccine  You may need this if you have certain risk factors. You may receive vaccines as individual doses or as more than one vaccine together in one shot (combination vaccines). Talk with your health care provider about the risks and benefits of combination vaccines. What tests do I need? Blood tests  Lipid and cholesterol levels. These may be checked every 5 years starting at age 13.  Hepatitis C test.  Hepatitis B test. Screening   Diabetes screening. This is done by checking your blood sugar (glucose) after you have not eaten for a while (fasting).  Sexually transmitted disease (STD) testing. Talk with your health care provider about your test results, treatment options, and if necessary, the need for more tests. Follow these instructions at home: Eating and drinking   Eat a diet that includes fresh fruits and vegetables, whole grains, lean protein, and low-fat dairy  products.  Take vitamin and mineral supplements as recommended by your health care provider.  Do not drink alcohol if your health care provider tells you not to drink.  If you drink alcohol: ? Limit how much you have to 0-2 drinks a day. ? Be aware of how much alcohol is in your drink. In the [place] one drink equals one 12 oz bottle of beer  (355 mL), one 5 oz glass of wine (148 mL), or one 1 oz glass of hard liquor (44 mL). Lifestyle  Take daily care of your teeth and gums.  Stay active. Exercise for at least 30 minutes on 5 or more days each week.  Do not use any products that contain nicotine or tobacco, such as cigarettes, e-cigarettes, and chewing tobacco. If you need help quitting, ask your health care provider.  If you are sexually active, practice safe sex. Use a condom or other form of protection to prevent STIs (sexually transmitted infections). What's next?  Go to your health care provider once a year for a well check visit.  Ask your health care provider how often you should have your eyes and teeth checked.  Stay up to date on all vaccines. This information is not intended to replace advice given to you by your health care provider. Make sure you discuss any questions you have with your health care provider. Document Revised: 03/29/2018 Document Reviewed: 03/29/2018 Elsevier Patient Education  2020 [employer].

## 2019-11-20 ENCOUNTER — Other Ambulatory Visit: Payer: Self-pay | Admitting: Family Medicine

## 2019-11-20 LAB — CBC WITH DIFFERENTIAL/PLATELET
Basophils Absolute: 0 10*3/uL (ref 0.0–0.2)
Basos: 1 %
EOS (ABSOLUTE): 0.1 10*3/uL (ref 0.0–0.4)
Eos: 2 %
Hematocrit: 48.1 % (ref 37.5–51.0)
Hemoglobin: 15.9 g/dL (ref 13.0–17.7)
Immature Grans (Abs): 0 10*3/uL (ref 0.0–0.1)
Immature Granulocytes: 0 %
Lymphocytes Absolute: 2.1 10*3/uL (ref 0.7–3.1)
Lymphs: 52 %
MCH: 26.1 pg — ABNORMAL LOW (ref 26.6–33.0)
MCHC: 33.1 g/dL (ref 31.5–35.7)
MCV: 79 fL (ref 79–97)
Monocytes Absolute: 0.4 10*3/uL (ref 0.1–0.9)
Monocytes: 11 %
Neutrophils Absolute: 1.4 10*3/uL (ref 1.4–7.0)
Neutrophils: 34 %
Platelets: 208 10*3/uL (ref 150–450)
RBC: 6.1 x10E6/uL — ABNORMAL HIGH (ref 4.14–5.80)
RDW: 13.9 % (ref 11.6–15.4)
WBC: 4 10*3/uL (ref 3.4–10.8)

## 2019-11-20 LAB — COMPREHENSIVE METABOLIC PANEL
ALT: 33 IU/L (ref 0–44)
AST: 21 IU/L (ref 0–40)
Albumin/Globulin Ratio: 1.4 (ref 1.2–2.2)
Albumin: 4.4 g/dL (ref 4.0–5.0)
Alkaline Phosphatase: 82 IU/L (ref 48–121)
BUN/Creatinine Ratio: 10 (ref 9–20)
BUN: 14 mg/dL (ref 6–20)
Bilirubin Total: 0.4 mg/dL (ref 0.0–1.2)
CO2: 27 mmol/L (ref 20–29)
Calcium: 10 mg/dL (ref 8.7–10.2)
Chloride: 98 mmol/L (ref 96–106)
Creatinine, Ser: 1.35 mg/dL — ABNORMAL HIGH (ref 0.76–1.27)
GFR calc Af Amer: 76 mL/min/{1.73_m2} (ref >59)
GFR calc non Af Amer: 66 mL/min/{1.73_m2} (ref >59)
Globulin, Total: 3.2 g/dL (ref 1.5–4.5)
Glucose: 65 mg/dL (ref 65–99)
Potassium: 4.9 mmol/L (ref 3.5–5.2)
Sodium: 139 mmol/L (ref 134–144)
Total Protein: 7.6 g/dL (ref 6.0–8.5)

## 2019-11-20 LAB — LIPID PANEL
Chol/HDL Ratio: 2.5 ratio (ref 0.0–5.0)
Cholesterol, Total: 181 mg/dL (ref 100–199)
HDL: 73 mg/dL (ref >39)
LDL Chol Calc (NIH): 92 mg/dL (ref 0–99)
Triglycerides: 87 mg/dL (ref 0–149)
VLDL Cholesterol Cal: 16 mg/dL (ref 5–40)

## 2019-11-20 LAB — HEPATITIS C ANTIBODY: Hep C Virus Ab: <0.1 s/co ratio (ref 0.0–0.9)

## 2020-05-07 ENCOUNTER — Ambulatory Visit: Payer: 59 | Admitting: Family Medicine

## 2020-05-11 ENCOUNTER — Telehealth (INDEPENDENT_AMBULATORY_CARE_PROVIDER_SITE_OTHER): Payer: 59 | Admitting: Family Medicine

## 2020-05-11 ENCOUNTER — Encounter: Payer: Self-pay | Admitting: Family Medicine

## 2020-05-11 ENCOUNTER — Telehealth: Payer: Self-pay | Admitting: Family Medicine

## 2020-05-11 VITALS — Ht 76.0 in | Wt 230.0 lb

## 2020-05-11 DIAGNOSIS — J029 Acute pharyngitis, unspecified: Secondary | ICD-10-CM | POA: Diagnosis not present

## 2020-05-11 MED ORDER — MAGIC MOUTHWASH W/LIDOCAINE: 5.0000 mL | Freq: Four times a day (QID) | ORAL | 0 refills | Status: DC | PRN

## 2020-05-11 MED ORDER — AMOXICILLIN 500 MG PO CAPS: 500.0000 mg | ORAL_CAPSULE | Freq: Three times a day (TID) | ORAL | 0 refills | Status: DC

## 2020-05-11 NOTE — Progress Notes (Signed)
Name: Donald [name]   MRN: [id number]    DOB: 04/08/1981   Date:05/11/2020       Progress Note  Subjective  Chief Complaint  Chief Complaint  Patient presents with  . Sore Throat    X3 days    I connected with  Donald [name]  on 05/11/20 at 11:00 AM EST by a video enabled telemedicine application and verified that I am speaking with the correct person using two identifiers.  I discussed the limitations of evaluation and management by telemedicine and the availability of in person appointments. The patient expressed understanding and agreed to proceed with a virtual visit  Staff also discussed with the patient that there may be a patient responsible charge related to this service. Patient Location: at home  Provider Location: [facility] Additional Individuals present: kids   HPI  Sore Throat: he woke up on Saturday with sore throat, denies fever, chills, nausea or vomiting. It feels similar to previous episode of strep, but he had rapid COVID-19 test negative this am and is waiting on PCR results. His family members are not having symptoms. He had 3 COVID-19 vaccines. He states pain is worse on left side of throat, roof of his mouth is white, but uvula and posterior pharynx are very red. He was not able to send me pictures through my chart.   Patient Active Problem List   Diagnosis Date Noted  . Medication monitoring encounter 05/31/2016  . Hypertriglyceridemia 03/21/2016  . S/P laparoscopic sleeve gastrectomy 03/21/2016  . Preventative health care 09/10/2015  . Family hx of prostate cancer 09/10/2015  . Situational anxiety 06/26/2015  . Pre-diabetes 02/20/2015  . 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 Hyperlipidemia LDL goal <100 01/27/2015    Social History   Tobacco Use  . Smoking status: Former Smoker    Types: Cigarettes  . Smokeless tobacco: Never Used  . Tobacco comment: smokes at social events rarely  Substance Use Topics  .  Alcohol use: Yes    Alcohol/week: 0.0 - 1.0 standard drinks    Comment: socially     Current Outpatient Medications:  .  allopurinol (ZYLOPRIM) 300 MG tablet, Take 300 mg tab po in the morning, take 150 mg (half tab) po in evening daily for gout, Disp: 135 tablet, Rfl: 3 .  indomethacin (INDOCIN) 25 MG capsule, Take 50mg  PO TID x 2d, then 25 mg PO TID x 3 d, take PRN at onset of gout symptoms, Disp: 30 capsule, Rfl: 2 .  pantoprazole (PROTONIX) 40 MG tablet, Take 1 tablet (40 mg total) by mouth daily., Disp: 90 tablet, Rfl: 1 .  Calcium 500 MG CHEW, Chew by mouth 3 (three) times daily. (Patient not taking: Reported on 05/11/2020), Disp: , Rfl:  .  chlorhexidine (PERIDEX) 0.12 % solution, 0.12 mLs by Mouth Rinse route 2 (two) times daily. (Patient not taking: Reported on 05/11/2020), Disp: 120 mL, Rfl: 6 .  Multiple Vitamin (MULTIVITAMIN) tablet, Take 1 tablet by mouth daily. (Patient not taking: Reported on 05/11/2020), Disp: , Rfl:   No Known Allergies  I personally reviewed active problem list, medication list, allergies, family history, social history, health maintenance with the patient/caregiver today.  ROS  Ten systems reviewed and is negative except as mentioned in HPI   Objective  Virtual encounter, vitals not obtained.  Body mass index is 28 kg/m.  Nursing Note and Vital Signs reviewed.  Physical Exam  Awake, alert and oriented  Assessment & Plan  1. Sore throat  - amoxicillin (AMOXIL) 500 MG capsule; Take 1 capsule (500 mg total) by mouth 3 (three) times daily.  Dispense: 30 capsule; Refill: 0 - magic mouthwash w/lidocaine SOLN; Take 5 mLs by mouth 4 (four) times daily as needed for mouth pain. Equal parts  Dispense: 240 mL; Refill: 0  Discussed peritonsilar abscess and the need to go to [facility] if worsening of symptoms. Advised to stay hydrated - use magic mouthwash to control pain prior to meals.   -Red flags and when to present for emergency care or RTC including  fever >101.81F, chest pain, shortness of breath, new/worsening/un-resolving symptoms, reviewed with patient at time of visit. Follow up and care instructions discussed and provided in AVS. - I discussed the assessment and treatment plan with the patient. The patient was provided an opportunity to ask questions and all were answered. The patient agreed with the plan and demonstrated an understanding of the instructions.  I provided 15 minutes of non-face-to-face time during this encounter.  Ruel Favors, MD

## 2020-05-11 NOTE — Telephone Encounter (Signed)
Printed ready for pick up 

## 2020-05-11 NOTE — Telephone Encounter (Signed)
Pt calling to report the magic mouthwash is not at the pharmacy.  It says it was printed. Pt wants to know if he can come and pick it up?   [phone]

## 2020-05-13 ENCOUNTER — Encounter: Payer: Self-pay | Admitting: Family Medicine

## 2020-05-21 ENCOUNTER — Ambulatory Visit: Payer: 59 | Admitting: Family Medicine

## 2020-05-21 ENCOUNTER — Encounter: Payer: Self-pay | Admitting: Family Medicine

## 2020-05-21 ENCOUNTER — Other Ambulatory Visit: Payer: Self-pay

## 2020-05-21 VITALS — BP 115/80 | HR 88 | Temp 99.1°F | Resp 18 | Ht 76.0 in | Wt 239.8 lb

## 2020-05-21 DIAGNOSIS — J020 Streptococcal pharyngitis: Secondary | ICD-10-CM

## 2020-05-21 DIAGNOSIS — M109 Gout, unspecified: Secondary | ICD-10-CM

## 2020-05-21 DIAGNOSIS — K2100 Gastro-esophageal reflux disease with esophagitis, without bleeding: Secondary | ICD-10-CM

## 2020-05-21 DIAGNOSIS — R7303 Prediabetes: Secondary | ICD-10-CM

## 2020-05-21 DIAGNOSIS — M1A09X0 Idiopathic chronic gout, multiple sites, without tophus (tophi): Secondary | ICD-10-CM

## 2020-05-21 DIAGNOSIS — E785 Hyperlipidemia, unspecified: Secondary | ICD-10-CM

## 2020-05-21 DIAGNOSIS — Z5181 Encounter for therapeutic drug level monitoring: Secondary | ICD-10-CM

## 2020-05-21 MED ORDER — PREDNISONE 20 MG PO TABS
40.0000 mg | ORAL_TABLET | Freq: Every day | ORAL | 0 refills | Status: AC
Start: 2020-05-21 — End: 2020-05-24

## 2020-05-21 MED ORDER — CEPHALEXIN 500 MG PO CAPS: 500.0000 mg | ORAL_CAPSULE | Freq: Two times a day (BID) | ORAL | 0 refills | Status: AC

## 2020-05-21 MED ORDER — INDOMETHACIN 25 MG PO CAPS
ORAL_CAPSULE | ORAL | 2 refills | Status: DC
Start: 2020-05-21 — End: 2021-08-02

## 2020-05-21 MED ORDER — ALLOPURINOL 300 MG PO TABS
ORAL_TABLET | ORAL | 11 refills | Status: DC
Start: 2020-05-21 — End: 2021-08-02

## 2020-05-21 MED ORDER — PANTOPRAZOLE SODIUM 40 MG PO TBEC: 40.0000 mg | DELAYED_RELEASE_TABLET | Freq: Every day | ORAL | 11 refills | Status: DC

## 2020-05-21 NOTE — Progress Notes (Signed)
Patient ID: Donald [name], male    DOB: [birth year], 40 y.o.   MRN: [id number]  PCP: Donald Berry, PA-C  Chief Complaint  Patient presents with  . Follow-up    Subjective:   Donald [name] is a 40 y.o. male, presents to clinic with CC of the following:  HPI  Pt presents for routine f/up and recent strep throat with poor compliance to abx prescribed -he took 4 days of amoxicillin 500 mg 3 times daily and then stopped a few days later he felt worse so he restarted the medications until they were all gone He is upset that he was not told how long he should take the antibiotics for and he also is upset that he did not have mouthwash to the pharmacy when he got there  He feels about 85% improved continues to have some sores throat and swelling Is able to swallow liquids and solids and is not having any fever  Out of allopurinol - out of meds for a few months -manages gout he has not come in for an office visit for labs in quite some time Lab Results  Component Value Date   LABURIC 6.6 05/02/2019  some flares x the last 3 months -he does use indomethacin for gouty flares  Hyperlipidemia: Currently not on medications Last Lipids: Lab Results  Component Value Date   CHOL 181 11/19/2019   HDL 73 11/19/2019   LDLCALC 92 11/19/2019   TRIG 87 11/19/2019   CHOLHDL 2.5 11/19/2019   - Denies: Chest pain, shortness of breath, myalgias, claudication  History of prediabetes -improved to normal range after weight loss diet lifestyle changes and gastric sleeve Lab Results  Component Value Date   HGBA1C 5.4 05/02/2019        Patient Active Problem List   Diagnosis Date Noted  . Medication monitoring encounter 05/31/2016  . Hypertriglyceridemia 03/21/2016  . S/P laparoscopic sleeve gastrectomy 03/21/2016  . Preventative health care 09/10/2015  . Family hx of prostate cancer 09/10/2015  . Situational anxiety 06/26/2015  . Pre-diabetes 02/20/2015  . Idiopathic chronic  gout of multiple sites without tophus 02/20/2015  . Hyperlipidemia LDL goal <100 01/27/2015      Current Outpatient Medications:  .  allopurinol (ZYLOPRIM) 300 MG tablet, Take 300 mg tab po in the morning, take 150 mg (half tab) po in evening daily for gout, Disp: 135 tablet, Rfl: 3 .  amoxicillin (AMOXIL) 500 MG capsule, Take 1 capsule (500 mg total) by mouth 3 (three) times daily., Disp: 30 capsule, Rfl: 0 .  Calcium 500 MG CHEW, Chew by mouth 3 (three) times daily., Disp: , Rfl:  .  chlorhexidine (PERIDEX) 0.12 % solution, 0.12 mLs by Mouth Rinse route 2 (two) times daily., Disp: 120 mL, Rfl: 6 .  indomethacin (INDOCIN) 25 MG capsule, Take 50mg  PO TID x 2d, then 25 mg PO TID x 3 d, take PRN at onset of gout symptoms, Disp: 30 capsule, Rfl: 2 .  magic mouthwash w/lidocaine SOLN, Take 5 mLs by mouth 4 (four) times daily as needed for mouth pain. Equal parts, Disp: 240 mL, Rfl: 0 .  Multiple Vitamin (MULTIVITAMIN) tablet, Take 1 tablet by mouth daily., Disp: , Rfl:  .  pantoprazole (PROTONIX) 40 MG tablet, Take 1 tablet (40 mg total) by mouth daily., Disp: 90 tablet, Rfl: 1   No Known Allergies   Social History   Tobacco Use  . Smoking status: Former Smoker    Types: Cigarettes  .  Smokeless tobacco: Never Used  . Tobacco comment: smokes at social events rarely  Vaping Use  . Vaping Use: Never used  Substance Use Topics  . Alcohol use: Yes    Alcohol/week: 0.0 - 1.0 standard drinks    Comment: socially  . Drug use: No      Chart Review Today: I personally reviewed active problem list, medication list, allergies, family history, social history, health maintenance, notes from last encounter, lab results, imaging with the patient/caregiver today.   Review of Systems  Constitutional: Negative.   HENT: Negative.   Eyes: Negative.   Respiratory: Negative.   Cardiovascular: Negative.   Gastrointestinal: Negative.   Endocrine: Negative.   Genitourinary: Negative.    Musculoskeletal: Negative.   Skin: Negative.   Allergic/Immunologic: Negative.   Neurological: Negative.   Hematological: Negative.   Psychiatric/Behavioral: Negative.   All other systems reviewed and are negative.      Objective:   Vitals:   05/21/20 0958  BP: 115/80  Pulse: 88  Resp: 18  Temp: 99.1 F (37.3 C)  TempSrc: Oral  SpO2: 99%  Weight: 239 lb 12.8 oz (108.8 kg)  Height: 6\' 4"  (1.93 m)    Body mass index is 29.19 kg/m.  Physical Exam Vitals and nursing note reviewed.  Constitutional:      General: He is not in acute distress.    Appearance: Normal appearance. He is well-developed, well-groomed and overweight. He is not ill-appearing, toxic-appearing or diaphoretic.     Interventions: Face mask in place.  HENT:     Head: Normocephalic and atraumatic.     Jaw: No trismus.     Right Ear: External ear normal.     Left Ear: External ear normal.     Mouth/Throat:     Mouth: Mucous membranes are moist.     Pharynx: Uvula midline. Pharyngeal swelling, oropharyngeal exudate and posterior oropharyngeal erythema present. No uvula swelling.     Tonsils: Tonsillar exudate present. 3+ on the right. 3+ on the left.     Comments: Normal phonation Eyes:     General: Lids are normal. No scleral icterus.       Right eye: No discharge.        Left eye: No discharge.     Conjunctiva/sclera: Conjunctivae normal.  Neck:     Trachea: Trachea and phonation normal. No tracheal deviation.  Cardiovascular:     Rate and Rhythm: Normal rate and regular rhythm.     Pulses: Normal pulses.          Radial pulses are 2+ on the right side and 2+ on the left side.       Posterior tibial pulses are 2+ on the right side and 2+ on the left side.     Heart sounds: Normal heart sounds. No murmur heard. No friction rub. No gallop.   Pulmonary:     Effort: Pulmonary effort is normal. No respiratory distress.     Breath sounds: Normal breath sounds. No stridor. No wheezing, rhonchi or  rales.  Abdominal:     General: Bowel sounds are normal. There is no distension.     Palpations: Abdomen is soft.  Musculoskeletal:     Right lower leg: No edema.     Left lower leg: No edema.  Skin:    General: Skin is warm and dry.     Coloration: Skin is not jaundiced.     Findings: No rash.     Nails: There is no clubbing.  Neurological:     Mental Status: He is alert. Mental status is at baseline.     Cranial Nerves: No dysarthria or facial asymmetry.     Motor: No tremor or abnormal muscle tone.     Gait: Gait normal.  Psychiatric:        Attention and Perception: Attention normal.        Speech: Speech normal.        Behavior: Behavior normal. Behavior is cooperative.     Comments: Patient's mood and affect slightly upset and he appeared frustrated      Results for orders placed or performed in visit on 11/05/19  CBC with Differential/Platelet  Result Value Ref Range   WBC 4.0 3.4 - 10.8 x10E3/uL   RBC 6.10 (H) 4.14 - 5.80 x10E6/uL   Hemoglobin 15.9 13.0 - 17.7 g/dL   Hematocrit 58.8 50.2 - 51.0 %   MCV 79 79 - 97 fL   MCH 26.1 (L) 26.6 - 33.0 pg   MCHC 33.1 31.5 - 35.7 g/dL   RDW 77.4 12.8 - 78.6 %   Platelets 208 150 - 450 x10E3/uL   Neutrophils 34 Not Estab. %   Lymphs 52 Not Estab. %   Monocytes 11 Not Estab. %   Eos 2 Not Estab. %   Basos 1 Not Estab. %   Neutrophils Absolute 1.4 1.4 - 7.0 x10E3/uL   Lymphocytes Absolute 2.1 0.7 - 3.1 x10E3/uL   Monocytes Absolute 0.4 0.1 - 0.9 x10E3/uL   EOS (ABSOLUTE) 0.1 0.0 - 0.4 x10E3/uL   Basophils Absolute 0.0 0.0 - 0.2 x10E3/uL   Immature Granulocytes 0 Not Estab. %   Immature Grans (Abs) 0.0 0.0 - 0.1 x10E3/uL  Lipid panel  Result Value Ref Range   Cholesterol, Total 181 100 - 199 mg/dL   Triglycerides 87 0 - 149 mg/dL   HDL 73 >76 mg/dL   VLDL Cholesterol Cal 16 5 - 40 mg/dL   LDL Chol Calc (NIH) 92 0 - 99 mg/dL   Chol/HDL Ratio 2.5 0.0 - 5.0 ratio  Hepatitis C antibody  Result Value Ref Range   Hep C  Virus Ab <0.1 0.0 - 0.9 s/co ratio  Comprehensive metabolic panel  Result Value Ref Range   Glucose 65 65 - 99 mg/dL   BUN 14 6 - 20 mg/dL   Creatinine, Ser 7.20 (H) 0.76 - 1.27 mg/dL   GFR calc non Af Amer 66 >59 mL/min/1.73   GFR calc Af Amer 76 >59 mL/min/1.73   BUN/Creatinine Ratio 10 9 - 20   Sodium 139 134 - 144 mmol/L   Potassium 4.9 3.5 - 5.2 mmol/L   Chloride 98 96 - 106 mmol/L   CO2 27 20 - 29 mmol/L   Calcium 10.0 8.7 - 10.2 mg/dL   Total Protein 7.6 6.0 - 8.5 g/dL   Albumin 4.4 4.0 - 5.0 g/dL   Globulin, Total 3.2 1.5 - 4.5 g/dL   Albumin/Globulin Ratio 1.4 1.2 - 2.2   Bilirubin Total 0.4 0.0 - 1.2 mg/dL   Alkaline Phosphatase 82 48 - 121 IU/L   AST 21 0 - 40 IU/L   ALT 33 0 - 44 IU/L       Assessment & Plan:     ICD-10-CM   1. Gout, unspecified cause, unspecified chronicity, unspecified site  M10.9 allopurinol (ZYLOPRIM) 300 MG tablet   not well controlled, pt ran out of allopurinol - not able to get 90d supply from pharmacy, only 30 d, refills and  meds adjusted, recheck labs  2. Gastroesophageal reflux disease with esophagitis, unspecified whether hemorrhage  K21.00 pantoprazole (PROTONIX) 40 MG tablet   sx currently well controlled  3. 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 tophus  M1A.09X0 indomethacin (INDOCIN) 25 MG capsule    COMPLETE METABOLIC PANEL WITH GFR    Uric acid   same as above -  adjust allopurinol to get uric acid less than 6.0  4. Strep pharyngitis  J02.0 cephALEXin (KEFLEX) 500 MG capsule    predniSONE (DELTASONE) 20 MG tablet   did not complete abx as prescribed, still fairly significant OP swelling, 3+ tonsils with exudate and erythema, 3 d steroid, keflex 500 mg BID 10d   5. Hyperlipidemia, unspecified hyperlipidemia type  E78.5 COMPLETE METABOLIC PANEL WITH GFR    Lipid panel   monitoring, has been well controlled with weight loss, diet and lifestyle efforts  6. Pre-diabetes  R73.03 COMPLETE METABOLIC PANEL WITH GFR     Hemoglobin A1c   recheck   7. Encounter for medication monitoring  Z51.81 CBC with Differential/Platelet    COMPLETE METABOLIC PANEL WITH GFR    Lipid panel    Hemoglobin A1c    Uric acid        Donald Berry, PA-C 05/21/20 10:12 AM

## 2020-05-22 LAB — COMPLETE METABOLIC PANEL WITH GFR
AG Ratio: 1.4 (calc) (ref 1.0–2.5)
ALT: 16 U/L (ref 9–46)
AST: 16 U/L (ref 10–40)
Albumin: 4.3 g/dL (ref 3.6–5.1)
Alkaline phosphatase (APISO): 65 U/L (ref 36–130)
BUN: 12 mg/dL (ref 7–25)
CO2: 31 mmol/L (ref 20–32)
Calcium: 9.9 mg/dL (ref 8.6–10.3)
Chloride: 102 mmol/L (ref 98–110)
Creat: 1.26 mg/dL (ref 0.60–1.35)
GFR, Est African American: 83 mL/min/{1.73_m2} (ref >=60)
GFR, Est Non African American: 71 mL/min/{1.73_m2} (ref >=60)
Globulin: 3.1 g/dL (calc) (ref 1.9–3.7)
Glucose, Bld: 76 mg/dL (ref 65–99)
Potassium: 5.1 mmol/L (ref 3.5–5.3)
Sodium: 139 mmol/L (ref 135–146)
Total Bilirubin: 0.4 mg/dL (ref 0.2–1.2)
Total Protein: 7.4 g/dL (ref 6.1–8.1)

## 2020-05-22 LAB — HEMOGLOBIN A1C
Hgb A1c MFr Bld: 5.4 % of total Hgb (ref <5.7)
Mean Plasma Glucose: 108 mg/dL
eAG (mmol/L): 6 mmol/L

## 2020-05-22 LAB — CBC WITH DIFFERENTIAL/PLATELET
Absolute Monocytes: 306 cells/uL (ref 200–950)
Basophils Absolute: 40 cells/uL (ref 0–200)
Basophils Relative: 1.1 %
Eosinophils Absolute: 50 cells/uL (ref 15–500)
Eosinophils Relative: 1.4 %
HCT: 50.8 % — ABNORMAL HIGH (ref 38.5–50.0)
Hemoglobin: 16.8 g/dL (ref 13.2–17.1)
Lymphs Abs: 1865 cells/uL (ref 850–3900)
MCH: 27.1 pg (ref 27.0–33.0)
MCHC: 33.1 g/dL (ref 32.0–36.0)
MCV: 81.9 fL (ref 80.0–100.0)
MPV: 10.3 fL (ref 7.5–12.5)
Monocytes Relative: 8.5 %
Neutro Abs: 1339 cells/uL — ABNORMAL LOW (ref 1500–7800)
Neutrophils Relative %: 37.2 %
Platelets: 261 10*3/uL (ref 140–400)
RBC: 6.2 10*6/uL — ABNORMAL HIGH (ref 4.20–5.80)
RDW: 13.9 % (ref 11.0–15.0)
Total Lymphocyte: 51.8 %
WBC: 3.6 10*3/uL — ABNORMAL LOW (ref 3.8–10.8)

## 2020-05-22 LAB — LIPID PANEL
Cholesterol: 182 mg/dL (ref <200)
HDL: 66 mg/dL (ref >=40)
LDL Cholesterol (Calc): 102 mg/dL (calc) — ABNORMAL HIGH
Non-HDL Cholesterol (Calc): 116 mg/dL (calc) (ref <130)
Total CHOL/HDL Ratio: 2.8 (calc) (ref <5.0)
Triglycerides: 55 mg/dL (ref <150)

## 2020-05-22 LAB — URIC ACID: Uric Acid, Serum: 7.2 mg/dL (ref 4.0–8.0)

## 2020-05-27 ENCOUNTER — Telehealth: Payer: Self-pay

## 2020-05-27 DIAGNOSIS — D709 Neutropenia, unspecified: Secondary | ICD-10-CM

## 2020-05-27 NOTE — Telephone Encounter (Signed)
-----   Message from Danelle Berry, [place] sent at 05/26/2020  4:32 PM EST ----- Please let the patient know regarding his lab results  He does have neutropenia which is a decrease in white blood cells sometimes this is seen with acute illness or a viral illness -I would like to recheck his CBC in the next month -please put in the lab order and diagnosis and patient can just come by and get the labs done and I will call him in follow-up only if needed will we get an additional appointment  His kidney function, liver function, electrolytes are all good Blood sugar and A1c were normal -in normal range with no prediabetes  Uric acid was elevated at 7.2 and the goal with a history of gout is to keep this below 6 -he should restart his allopurinol  -if he has any gout flares I do recommend he come in specifically for gout follow-up and med check in about 3 months (should not wait until his August appointment if he is having problems or flares)  Please see if he is feeling better?

## 2020-06-01 ENCOUNTER — Encounter: Payer: Self-pay | Admitting: Family Medicine

## 2020-06-04 ENCOUNTER — Telehealth: Payer: Self-pay

## 2020-06-04 NOTE — Telephone Encounter (Signed)
Called Olegario Messier back on 06-04-2020 at 4:25 t let her know that we have received the request and that he comes every other week and was just here last week.

## 2020-06-04 NOTE — Telephone Encounter (Signed)
Copied from CRM [id number]. Topic: Medical Record Request - Other >> Jun 04, 2020  3:08 PM Leafy Ro wrote: Patient Name/DOB/MRN #: Donald [name]  [birth year] mrn [id number] Requestor Name/Agency: [employer] Call Back #: [phone] reference number [id number] Information Requested: Olegario Messier faxed over medical release form to obtain patient records  Route to [facility] for [place] clinics. For all other clinics, route to the clinic's PEC Pool.

## 2020-06-25 ENCOUNTER — Encounter: Payer: Self-pay | Admitting: Family Medicine

## 2020-10-16 ENCOUNTER — Encounter (HOSPITAL_COMMUNITY): Payer: Self-pay | Admitting: *Deleted

## 2020-11-17 ENCOUNTER — Encounter: Payer: Self-pay | Admitting: Family Medicine

## 2020-11-17 ENCOUNTER — Other Ambulatory Visit: Payer: Self-pay

## 2020-11-17 ENCOUNTER — Ambulatory Visit (INDEPENDENT_AMBULATORY_CARE_PROVIDER_SITE_OTHER): Payer: 59 | Admitting: Family Medicine

## 2020-11-17 VITALS — BP 122/82 | HR 73 | Temp 98.0°F | Resp 16 | Ht 76.0 in | Wt 237.5 lb

## 2020-11-17 DIAGNOSIS — Z9884 Bariatric surgery status: Secondary | ICD-10-CM | POA: Diagnosis not present

## 2020-11-17 DIAGNOSIS — Z8042 Family history of malignant neoplasm of prostate: Secondary | ICD-10-CM | POA: Diagnosis not present

## 2020-11-17 DIAGNOSIS — Z5181 Encounter for therapeutic drug level monitoring: Secondary | ICD-10-CM

## 2020-11-17 DIAGNOSIS — K219 Gastro-esophageal reflux disease without esophagitis: Secondary | ICD-10-CM

## 2020-11-17 DIAGNOSIS — Z0000 Encounter for general adult medical examination without abnormal findings: Secondary | ICD-10-CM

## 2020-11-17 DIAGNOSIS — E785 Hyperlipidemia, unspecified: Secondary | ICD-10-CM

## 2020-11-17 DIAGNOSIS — R7303 Prediabetes: Secondary | ICD-10-CM

## 2020-11-17 DIAGNOSIS — M1A09X0 Idiopathic chronic gout, multiple sites, without tophus (tophi): Secondary | ICD-10-CM

## 2020-11-17 DIAGNOSIS — D709 Neutropenia, unspecified: Secondary | ICD-10-CM

## 2020-11-17 NOTE — Patient Instructions (Signed)
Preventive Care 21-39 Years Old, Male Preventive care refers to lifestyle choices and visits with your health care provider that can promote health and wellness. This includes: A yearly physical exam. This is also called an annual wellness visit. Regular dental and eye exams. Immunizations. Screening for certain conditions. Healthy lifestyle choices, such as: Eating a healthy diet. Getting regular exercise. Not using drugs or products that contain nicotine and tobacco. Limiting alcohol use. What can I expect for my preventive care visit? Physical exam Your health care provider may check your: Height and weight. These may be used to calculate your BMI (body mass index). BMI is a measurement that tells if you are at a healthy weight. Heart rate and blood pressure. Body temperature. Skin for abnormal spots. Counseling Your health care provider may ask you questions about your: Past medical problems. Family's medical history. Alcohol, tobacco, and drug use. Emotional well-being. Home life and relationship well-being. Sexual activity. Diet, exercise, and sleep habits. Work and work environment. Access to firearms. What immunizations do I need?  Vaccines are usually given at various ages, according to a schedule. Your health care provider will recommend vaccines for you based on your age, medicalhistory, and lifestyle or other factors, such as travel or where you work. What tests do I need? Blood tests Lipid and cholesterol levels. These may be checked every 5 years starting at age 20. Hepatitis C test. Hepatitis B test. Screening  Diabetes screening. This is done by checking your blood sugar (glucose) after you have not eaten for a while (fasting). Genital exam to check for testicular cancer or hernias. STD (sexually transmitted disease) testing, if you are at risk. Talk with your health care provider about your test results, treatment options,and if necessary, the need for more  tests. Follow these instructions at home: Eating and drinking  Eat a healthy diet that includes fresh fruits and vegetables, whole grains, lean protein, and low-fat dairy products. Drink enough fluid to keep your urine pale yellow. Take vitamin and mineral supplements as recommended by your health care provider. Do not drink alcohol if your health care provider tells you not to drink. If you drink alcohol: Limit how much you have to 0-2 drinks a day. Be aware of how much alcohol is in your drink. In the [place] one drink equals one 12 oz bottle of beer (355 mL), one 5 oz glass of wine (148 mL), or one 1 oz glass of hard liquor (44 mL).  Lifestyle Take daily care of your teeth and gums. Brush your teeth every morning and night with fluoride toothpaste. Floss one time each day. Stay active. Exercise for at least 30 minutes 5 or more days each week. Do not use any products that contain nicotine or tobacco, such as cigarettes, e-cigarettes, and chewing tobacco. If you need help quitting, ask your health care provider. Do not use drugs. If you are sexually active, practice safe sex. Use a condom or other form of protection to prevent STIs (sexually transmitted infections). Find healthy ways to cope with stress, such as: Meditation, yoga, or listening to music. Journaling. Talking to a trusted person. Spending time with friends and family. Safety Always wear your seat belt while driving or riding in a vehicle. Do not drive: If you have been drinking alcohol. Do not ride with someone who has been drinking. When you are tired or distracted. While texting. Wear a helmet and other protective equipment during sports activities. If you have firearms in your house, make sure   you follow all gun safety procedures. Seek help if you have been physically or sexually abused. What's next? Go to your health care provider once a year for an annual wellness visit. Ask your health care provider how often  you should have your eyes and teeth checked. Stay up to date on all vaccines. This information is not intended to replace advice given to you by your health care provider. Make sure you discuss any questions you have with your healthcare provider. Document Revised: 12/19/2018 Document Reviewed: 03/29/2018 Elsevier Patient Education  2022 [employer]  

## 2020-11-17 NOTE — Progress Notes (Signed)
Patient: Donald [name], Male    DOB: 1980/08/19, 40 y.o.   MRN: [id number] Danelle Berry, PA-C Visit Date: 11/17/2020  Today's Provider: Danelle Berry, PA-C   Chief Complaint  Patient presents with  . Annual Exam   Subjective:   Annual physical exam:  Donald [name] is a 40 y.o. male who presents today for health maintenance and annual & complete physical exam.   Exercise/Activity:  a lot of walking at work  Diet/nutrition:  small portions, s/p bariatric surgery  Sleep:  sleeps well   USPSTF grade A and B recommendations - reviewed and addressed today  Depression:  Phq 9 completed today by patient, was reviewed by me with patient in the room, score is  negative, pt feels good PHQ 2/9 Scores 11/17/2020 05/21/2020 05/11/2020 11/05/2019  PHQ - 2 Score 0 0 0 0  PHQ- 9 Score 0 - - 0   Depression screen [facility] 2/9 11/17/2020 05/21/2020 05/11/2020 11/05/2019 05/02/2019  Decreased Interest 0 0 0 0 0  Down, Depressed, Hopeless 0 0 0 0 0  PHQ - 2 Score 0 0 0 0 0  Altered sleeping 0 - - 0 0  Tired, decreased energy 0 - - 0 0  Change in appetite 0 - - 0 0  Feeling bad or failure about yourself  0 - - 0 0  Trouble concentrating 0 - - 0 0  Moving slowly or fidgety/restless 0 - - 0 0  Suicidal thoughts 0 - - 0 0  PHQ-9 Score 0 - - 0 0  Difficult doing work/chores Not difficult at all - - Not difficult at all Not difficult at all   Hep C Screening: done in the past STD testing and prevention (HIV/chl/gon/syphilis): none needed  Intimate partner violence:  safe, no abuse  Prostate cancer: dad prostate CA- 24's, paternal grandfather prostate 105's Prostate cancer screening with PSA: Discussed risks and benefits of PSA testing and provided handout. Pt will not have to have PSA drawn today.  Advanced Care Planning:  A voluntary discussion about advance care planning including the explanation and discussion of advance directives.  Discussed health care proxy and Living will, and the patient was  able to identify a health care proxy as wife.  Patient does not have a living will at present time. If patient does have living will, I have requested they bring this to the clinic to be scanned in to their chart.  Health Maintenance  Topic Date Due  . INFLUENZA VACCINE  11/16/2020  . TETANUS/TDAP  08/05/2027  . COVID-19 Vaccine  Completed  . Hepatitis C Screening  Completed  . HIV Screening  Completed  . Pneumococcal Vaccine 54-60 Years old  Aged Out  . HPV VACCINES  Aged Out    Skin cancer:  Pt reports no hx of skin cancer, suspicious lesions/biopsies in the past. Skin tags to neck and arm pits, neck bothers him, trauma tenderness  Colorectal cancer:  colonoscopy is not due   Pt denies blood occasionally in stool, hemorrhoids, not constipated   Lung cancer:  Low Dose CT Chest recommended if Age 45-80 years, 20 pack-year currently smoking OR have quit w/in 15years. Patient does not qualify.   Social History   Tobacco Use  . Smoking status: Former    Types: Cigarettes  . Smokeless tobacco: Never  . Tobacco comments:    smokes at social events rarely  Substance Use Topics  . Alcohol use: Yes    Alcohol/week: 0.0 - 1.0  standard drinks    Comment: socially     Alcohol screening: Flowsheet Row [facility]  AUDIT-C Score 0       AAA: The USPSTF recommends one-time screening with ultrasonography in men ages 76 to 54 years who have ever smoked  ECG:  none  Blood pressure/Hypertension: BP Readings from Last 3 Encounters:  11/17/20 122/82  05/21/20 115/80  11/05/19 120/82   Weight/Obesity: Wt Readings from Last 3 Encounters:  11/17/20 237 lb 8 oz (107.7 kg)  05/21/20 239 lb 12.8 oz (108.8 kg)  05/11/20 230 lb (104.3 kg)   BMI Readings from Last 3 Encounters:  11/17/20 28.91 kg/m  05/21/20 29.19 kg/m  05/11/20 28.00 kg/m    Lipids:  Lab Results  Component Value Date   CHOL 182 05/21/2020   CHOL 181 11/19/2019    CHOL 207 (H) 05/02/2019   Lab Results  Component Value Date   HDL 66 05/21/2020   HDL 73 11/19/2019   HDL 59 05/02/2019   Lab Results  Component Value Date   LDLCALC 102 (H) 05/21/2020   LDLCALC 92 11/19/2019   LDLCALC 131 (H) 05/02/2019   Lab Results  Component Value Date   TRIG 55 05/21/2020   TRIG 87 11/19/2019   TRIG 73 05/02/2019   Lab Results  Component Value Date   CHOLHDL 2.8 05/21/2020   CHOLHDL 2.5 11/19/2019   CHOLHDL 3.5 05/02/2019   No results found for: LDLDIRECT Based on the results of lipid panel his/her cardiovascular risk factor ( using [place] Cohort )  in the next 10 years is : The ASCVD Risk score Denman George DC Jr., et al., 2013) failed to calculate for the following reasons:   The 2013 ASCVD risk score is only valid for ages 61 to 42 Glucose:  Glucose  Date Value Ref Range Status  11/19/2019 65 65 - 99 mg/dL Final  16/01/9603 76 65 - 99 mg/dL Final  54/12/8117 78 65 - 99 mg/dL Final   Glucose, Bld  Date Value Ref Range Status  05/21/2020 76 65 - 99 mg/dL Final    Comment:    .            Fasting reference interval .   05/02/2019 74 65 - 99 mg/dL Final    Comment:    .            Fasting reference interval .   03/22/2016 115 (H) 65 - 99 mg/dL Final   Glucose-Capillary  Date Value Ref Range Status  03/21/2016 159 (H) 65 - 99 mg/dL Final    Social History      He  reports that he has quit smoking. His smoking use included cigarettes. He has never used smokeless tobacco. He reports current alcohol use. He reports that he does not use drugs.       Social History   Socioeconomic History  . Marital status: Married    Spouse name: Donald [name]  . Number of children: 2  . Years of education: Not on file  . Highest education level: Some college, no degree  Occupational History  . Not on file  Tobacco Use  . Smoking status: Former    Types: Cigarettes  . Smokeless tobacco: Never  . Tobacco comments:    smokes at social events rarely   Vaping Use  . Vaping Use: Never used  Substance and Sexual Activity  . Alcohol use: Yes    Alcohol/week: 0.0 - 1.0 standard drinks  Comment: socially  . Drug use: No  . Sexual activity: Yes    Partners: Female  Other Topics Concern  . Not on file  Social History Narrative  . Not on file   Social Determinants of Health   Financial Resource Strain: Low Risk   . Difficulty of Paying Living Expenses: Not hard at all  Food Insecurity: No Food Insecurity  . Worried About Programme researcher, broadcasting/film/video in the Last Year: Never true  . Ran Out of Food in the Last Year: Never true  Transportation Needs: No Transportation Needs  . Lack of Transportation (Medical): No  . Lack of Transportation (Non-Medical): No  Physical Activity: Sufficiently Active  . Days of Exercise per Week: 6 days  . Minutes of Exercise per Session: 80 min  Stress: No Stress Concern Present  . Feeling of Stress : Not at all  Social Connections: Socially Integrated  . Frequency of Communication with Friends and Family: More than three times a week  . Frequency of Social Gatherings with Friends and Family: More than three times a week  . Attends Religious Services: More than 4 times per year  . Active Member of Clubs or Organizations: Yes  . Attends Banker Meetings: More than 4 times per year  . Marital Status: Married     Family History        Family Status  Relation Name Status  . Mother  Alive  . Father  Alive  . Brother  Alive  . Daughter  Alive  . MGM  Alive  . MGF  Deceased  . PGM  Alive  . PGF  Alive  . Son  Alive        His family history includes Diabetes in his father and mother; Hyperlipidemia in his mother; Prostate cancer in his father and paternal grandfather.       Family History  Problem Relation Age of Onset  . Hyperlipidemia Mother   . Diabetes Mother   . Diabetes Father   . Prostate cancer Father   . Prostate cancer Paternal Grandfather     Patient Active Problem List    Diagnosis Date Noted  . Medication monitoring encounter 05/31/2016  . Hypertriglyceridemia 03/21/2016  . S/P laparoscopic sleeve gastrectomy 03/21/2016  . Preventative health care 09/10/2015  . Family hx of prostate cancer 09/10/2015  . Situational anxiety 06/26/2015  . Pre-diabetes 02/20/2015  . 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 Hyperlipidemia LDL goal <100 01/27/2015    Past Surgical History:  Procedure Laterality Date  . LAPAROSCOPIC GASTRIC SLEEVE RESECTION N/A 03/21/2016   Procedure: LAPAROSCOPIC GASTRIC SLEEVE RESECTION, UPPER ENDO;  Surgeon: Gaynelle Adu, MD;  Location: [facility]S;  Service: General;  Laterality: N/A;  . WISDOM TOOTH EXTRACTION       Current Outpatient Medications:  .  allopurinol (ZYLOPRIM) 300 MG tablet, Take 300 mg tab po in the morning, take 150 mg (half tab) po in evening daily for gout, Disp: 45 tablet, Rfl: 11 .  Calcium 500 MG CHEW, Chew by mouth 3 (three) times daily., Disp: , Rfl:  .  chlorhexidine (PERIDEX) 0.12 % solution, 0.12 mLs by Mouth Rinse route 2 (two) times daily., Disp: 120 mL, Rfl: 6 .  indomethacin (INDOCIN) 25 MG capsule, Take 50mg  PO TID x 2d, then 25 mg PO TID x 3 d, take PRN at onset of gout symptoms, Disp: 30 capsule, Rfl: 2 .  Multiple Vitamin (MULTIVITAMIN) tablet, Take 1 tablet by  mouth daily., Disp: , Rfl:  .  pantoprazole (PROTONIX) 40 MG tablet, Take 1 tablet (40 mg total) by mouth daily., Disp: 30 tablet, Rfl: 11  No Known Allergies  Patient Care Team: Danelle Berry, PA-C as PCP - General (Family Medicine) Gaynelle Adu, MD as Consulting Physician (General Surgery)   Chart Review: I personally reviewed active problem list, medication list, allergies, family history, social history, health maintenance, notes from last encounter, lab results, imaging with the patient/caregiver today.   Review of Systems  Constitutional: Negative.  Negative for activity change, appetite change, fatigue and  unexpected weight change.  HENT: Negative.    Eyes: Negative.   Respiratory: Negative.  Negative for shortness of breath.   Cardiovascular: Negative.  Negative for chest pain, palpitations and leg swelling.  Gastrointestinal: Negative.  Negative for abdominal pain and blood in stool.  Endocrine: Negative.   Genitourinary: Negative.  Negative for decreased urine volume, difficulty urinating, dysuria, flank pain, frequency, hematuria, penile discharge, penile pain, penile swelling, scrotal swelling, testicular pain and urgency.  Skin: Negative.  Negative for color change and pallor.  Allergic/Immunologic: Negative.   Neurological: Negative.  Negative for syncope, weakness, light-headedness and numbness.  Psychiatric/Behavioral: Negative.  Negative for confusion, dysphoric mood, self-injury and suicidal ideas. The patient is not nervous/anxious.   All other systems reviewed and are negative.        Objective:   Vitals:  Vitals:   11/17/20 0929  BP: 122/82  Pulse: 73  Resp: 16  Temp: 98 F (36.7 C)  SpO2: 97%  Weight: 237 lb 8 oz (107.7 kg)  Height: 6\' 4"  (1.93 m)    Body mass index is 28.91 kg/m.  Physical Exam Vitals and nursing note reviewed.  Constitutional:      General: He is not in acute distress.    Appearance: Normal appearance. He is well-developed and normal weight. He is not ill-appearing, toxic-appearing or diaphoretic.     Interventions: Face mask in place.  HENT:     Head: Normocephalic and atraumatic.     Jaw: No trismus.     Right Ear: External ear normal.     Left Ear: External ear normal.  Eyes:     General: Lids are normal. No scleral icterus.       Right eye: No discharge.        Left eye: No discharge.     Conjunctiva/sclera: Conjunctivae normal.  Neck:     Trachea: Trachea and phonation normal. No tracheal deviation.  Cardiovascular:     Rate and Rhythm: Normal rate and regular rhythm.     Pulses: Normal pulses.          Radial pulses are 2+ on  the right side and 2+ on the left side.       Posterior tibial pulses are 2+ on the right side and 2+ on the left side.     Heart sounds: Normal heart sounds. No murmur heard.   No friction rub. No gallop.  Pulmonary:     Effort: Pulmonary effort is normal. No respiratory distress.     Breath sounds: Normal breath sounds. No stridor. No wheezing, rhonchi or rales.  Abdominal:     General: Bowel sounds are normal. There is no distension.     Palpations: Abdomen is soft.  Musculoskeletal:     Right lower leg: No edema.     Left lower leg: No edema.  Skin:    General: Skin is warm and dry.  Coloration: Skin is not jaundiced.     Findings: No rash.     Nails: There is no clubbing.  Neurological:     Mental Status: He is alert. Mental status is at baseline.     Cranial Nerves: No dysarthria or facial asymmetry.     Motor: No tremor or abnormal muscle tone.     Gait: Gait normal.  Psychiatric:        Mood and Affect: Mood normal.        Speech: Speech normal.        Behavior: Behavior normal. Behavior is cooperative.     No results found for this or any previous visit (from the past 2160 hour(s)).     Fall Risk: Fall Risk  11/17/2020 05/21/2020 05/11/2020 11/05/2019 05/02/2019  Falls in the past year? 0 0 0 0 0  Number falls in past yr: 0 0 0 0 0  Injury with Fall? 0 0 0 0 0  Risk for fall due to : - - - No Fall Risks -  Follow up - Falls evaluation completed - Falls evaluation completed -    Functional Status Survey: Is the patient deaf or have difficulty hearing?: No Does the patient have difficulty seeing, even when wearing glasses/contacts?: No Does the patient have difficulty concentrating, remembering, or making decisions?: No Does the patient have difficulty walking or climbing stairs?: No Does the patient have difficulty dressing or bathing?: No Does the patient have difficulty doing errands alone such as visiting a doctor's office or shopping?: No   Assessment &  Plan:    CPE completed today  Prostate cancer screening and PSA options (with potential risks and benefits of testing vs not testing) were discussed along with recent recs/guidelines, shared decision making and handout/information given to pt today  USPSTF grade A and B recommendations reviewed with patient; age-appropriate recommendations, preventive care, screening tests, etc discussed and encouraged; healthy living encouraged; see AVS for patient education given to patient  Discussed importance of 150 minutes of physical activity weekly, AHA exercise recommendations given to pt in AVS/handout  Discussed importance of healthy diet:  eating lean meats and proteins, avoiding trans fats and saturated fats, avoid simple sugars and excessive carbs in diet, eat 6 servings of fruit/vegetables daily and drink plenty of water and avoid sweet beverages.  DASH diet reviewed if pt has HTN  Recommended pt to do annual eye exam and routine dental exams/cleanings  Reviewed Health Maintenance: Health Maintenance  Topic Date Due  . INFLUENZA VACCINE  11/16/2020  . TETANUS/TDAP  08/05/2027  . COVID-19 Vaccine  Completed  . Hepatitis C Screening  Completed  . HIV Screening  Completed  . Pneumococcal Vaccine 790-39 Years old  Aged Out  . HPV VACCINES  Aged Out    Immunizations: Immunization History  Administered Date(s) Administered  . Influenza,inj,Quad PF,6+ Mos 02/20/2015, 02/01/2016, 02/27/2017, 02/09/2019  . Influenza-Unspecified 01/15/2020  . PFIZER Comirnaty(Gray Top)Covid-19 Tri-Sucrose Vaccine 06/26/2019, 07/16/2019, 01/15/2020  . Tdap 10/26/2016, 08/04/2017     ICD-10-CM   1. Annual physical exam  Z00.00 Hemoglobin A1C    CBC w/Diff/Platelet    COMPLETE METABOLIC PANEL WITH GFR    Lipid panel    2. Neutropenia, unspecified type (HCC)  D70.9 CBC w/Diff/Platelet   Monitoring    3. Hyperlipidemia, unspecified hyperlipidemia type  E78.5 COMPLETE METABOLIC PANEL WITH GFR    Lipid panel    Recheck    4. Pre-diabetes  R73.03 Hemoglobin A1C    COMPLETE METABOLIC  PANEL WITH GFR    5. 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 tophus  M1A.09X0 COMPLETE METABOLIC PANEL WITH GFR    Uric acid   1 recent flare, he got back on 300 mg dose daily of allopurinol, recheck uric acid with a goal of less than 6.0    6. Encounter for medication monitoring  Z51.81 COMPLETE METABOLIC PANEL WITH GFR    Lipid panel    Uric acid    7. Gastroesophageal reflux disease, unspecified whether esophagitis present  K21.9    Occasional use of PPI when he has indigestion, acid reflux or burning usually occurs when he eats late at night, no daily use reviewed PPI long-term risk    8. S/P laparoscopic sleeve gastrectomy  Z98.84     9. Family hx of prostate cancer  Z80.42    discussed and reviewed monitoring and screening, no LUTS today, no PSA today, may start IPSS and PSA at age 62         Med refills for 1 year from now ok - pt will come in sooner if any problems CPE in one year   Danelle Berry, Cordelia Poche 11/17/20 5:46 PM  [facility]

## 2020-11-18 LAB — CBC WITH DIFFERENTIAL/PLATELET
Absolute Monocytes: 306 cells/uL (ref 200–950)
Basophils Absolute: 41 cells/uL (ref 0–200)
Basophils Relative: 1.2 %
Eosinophils Absolute: 41 cells/uL (ref 15–500)
Eosinophils Relative: 1.2 %
HCT: 53.1 % — ABNORMAL HIGH (ref 38.5–50.0)
Hemoglobin: 17 g/dL (ref 13.2–17.1)
Lymphs Abs: 1805 cells/uL (ref 850–3900)
MCH: 26.9 pg — ABNORMAL LOW (ref 27.0–33.0)
MCHC: 32 g/dL (ref 32.0–36.0)
MCV: 84 fL (ref 80.0–100.0)
MPV: 11.3 fL (ref 7.5–12.5)
Monocytes Relative: 9 %
Neutro Abs: 1207 cells/uL — ABNORMAL LOW (ref 1500–7800)
Neutrophils Relative %: 35.5 %
Platelets: 192 10*3/uL (ref 140–400)
RBC: 6.32 10*6/uL — ABNORMAL HIGH (ref 4.20–5.80)
RDW: 14.1 % (ref 11.0–15.0)
Total Lymphocyte: 53.1 %
WBC: 3.4 10*3/uL — ABNORMAL LOW (ref 3.8–10.8)

## 2020-11-18 LAB — HEMOGLOBIN A1C
Hgb A1c MFr Bld: 5.3 % of total Hgb (ref <5.7)
Mean Plasma Glucose: 105 mg/dL
eAG (mmol/L): 5.8 mmol/L

## 2020-11-18 LAB — LIPID PANEL
Cholesterol: 212 mg/dL — ABNORMAL HIGH (ref <200)
HDL: 67 mg/dL (ref >=40)
LDL Cholesterol (Calc): 126 mg/dL (calc) — ABNORMAL HIGH
Non-HDL Cholesterol (Calc): 145 mg/dL (calc) — ABNORMAL HIGH (ref <130)
Total CHOL/HDL Ratio: 3.2 (calc) (ref <5.0)
Triglycerides: 89 mg/dL (ref <150)

## 2020-11-18 LAB — COMPLETE METABOLIC PANEL WITH GFR
AG Ratio: 1.6 (calc) (ref 1.0–2.5)
ALT: 18 U/L (ref 9–46)
AST: 15 U/L (ref 10–40)
Albumin: 4.5 g/dL (ref 3.6–5.1)
Alkaline phosphatase (APISO): 64 U/L (ref 36–130)
BUN/Creatinine Ratio: 11 (calc) (ref 6–22)
BUN: 14 mg/dL (ref 7–25)
CO2: 31 mmol/L (ref 20–32)
Calcium: 9.9 mg/dL (ref 8.6–10.3)
Chloride: 103 mmol/L (ref 98–110)
Creat: 1.27 mg/dL — ABNORMAL HIGH (ref 0.60–1.26)
Globulin: 2.8 g/dL (calc) (ref 1.9–3.7)
Glucose, Bld: 76 mg/dL (ref 65–99)
Potassium: 5.1 mmol/L (ref 3.5–5.3)
Sodium: 140 mmol/L (ref 135–146)
Total Bilirubin: 0.5 mg/dL (ref 0.2–1.2)
Total Protein: 7.3 g/dL (ref 6.1–8.1)
eGFR: 74 mL/min/{1.73_m2} (ref >=60)

## 2020-11-18 LAB — URIC ACID: Uric Acid, Serum: 7.5 mg/dL (ref 4.0–8.0)

## 2020-11-20 ENCOUNTER — Other Ambulatory Visit: Payer: Self-pay

## 2020-11-20 DIAGNOSIS — R718 Other abnormality of red blood cells: Secondary | ICD-10-CM

## 2020-11-20 DIAGNOSIS — D709 Neutropenia, unspecified: Secondary | ICD-10-CM

## 2020-12-02 ENCOUNTER — Encounter: Payer: Self-pay | Admitting: Oncology

## 2020-12-02 ENCOUNTER — Inpatient Hospital Stay: Payer: 59

## 2020-12-02 ENCOUNTER — Inpatient Hospital Stay: Payer: 59 | Attending: Oncology | Admitting: Oncology

## 2020-12-02 VITALS — BP 122/86 | HR 78 | Resp 20 | Wt 239.4 lb

## 2020-12-02 DIAGNOSIS — D709 Neutropenia, unspecified: Secondary | ICD-10-CM

## 2020-12-02 DIAGNOSIS — D751 Secondary polycythemia: Secondary | ICD-10-CM

## 2020-12-02 DIAGNOSIS — Z9884 Bariatric surgery status: Secondary | ICD-10-CM | POA: Diagnosis not present

## 2020-12-02 DIAGNOSIS — E538 Deficiency of other specified B group vitamins: Secondary | ICD-10-CM

## 2020-12-02 DIAGNOSIS — G4730 Sleep apnea, unspecified: Secondary | ICD-10-CM | POA: Diagnosis not present

## 2020-12-02 DIAGNOSIS — Z87891 Personal history of nicotine dependence: Secondary | ICD-10-CM | POA: Diagnosis not present

## 2020-12-02 DIAGNOSIS — Z903 Acquired absence of stomach [part of]: Secondary | ICD-10-CM

## 2020-12-02 LAB — CBC WITH DIFFERENTIAL/PLATELET
Abs Immature Granulocytes: 0.01 10*3/uL (ref 0.00–0.07)
Basophils Absolute: 0 10*3/uL (ref 0.0–0.1)
Basophils Relative: 1 %
Eosinophils Absolute: 0.1 10*3/uL (ref 0.0–0.5)
Eosinophils Relative: 1 %
HCT: 51.1 % (ref 39.0–52.0)
Hemoglobin: 16.6 g/dL (ref 13.0–17.0)
Immature Granulocytes: 0 %
Lymphocytes Relative: 49 %
Lymphs Abs: 1.7 10*3/uL (ref 0.7–4.0)
MCH: 26.9 pg (ref 26.0–34.0)
MCHC: 32.5 g/dL (ref 30.0–36.0)
MCV: 83 fL (ref 80.0–100.0)
Monocytes Absolute: 0.3 10*3/uL (ref 0.1–1.0)
Monocytes Relative: 9 %
Neutro Abs: 1.4 10*3/uL — ABNORMAL LOW (ref 1.7–7.7)
Neutrophils Relative %: 40 %
Platelets: 214 10*3/uL (ref 150–400)
RBC: 6.16 MIL/uL — ABNORMAL HIGH (ref 4.22–5.81)
RDW: 13.8 % (ref 11.5–15.5)
WBC: 3.5 10*3/uL — ABNORMAL LOW (ref 4.0–10.5)
nRBC: 0 % (ref 0.0–0.2)

## 2020-12-02 LAB — LACTATE DEHYDROGENASE: LDH: 122 U/L (ref 98–192)

## 2020-12-02 LAB — TECHNOLOGIST SMEAR REVIEW
Plt Morphology: NORMAL
RBC MORPHOLOGY: NORMAL
WBC MORPHOLOGY: NORMAL

## 2020-12-02 LAB — RETIC PANEL
Immature Retic Fract: 4 % (ref 2.3–15.9)
RBC.: 6.11 MIL/uL — ABNORMAL HIGH (ref 4.22–5.81)
Retic Count, Absolute: 65.4 10*3/uL (ref 19.0–186.0)
Retic Ct Pct: 1.1 % (ref 0.4–3.1)
Reticulocyte Hemoglobin: 32.5 pg (ref >27.9)

## 2020-12-02 LAB — HEPATITIS B SURFACE ANTIGEN: Hepatitis B Surface Ag: NONREACTIVE

## 2020-12-02 LAB — HEPATITIS B CORE ANTIBODY, IGM: Hep B C IgM: NONREACTIVE

## 2020-12-02 LAB — FOLATE: Folate: 5.3 ng/mL — ABNORMAL LOW (ref >5.9)

## 2020-12-02 LAB — VITAMIN B12: Vitamin B-12: 348 pg/mL (ref 180–914)

## 2020-12-02 MED ORDER — FOLIC ACID 1 MG PO TABS: 1.0000 mg | ORAL_TABLET | Freq: Every day | ORAL | 2 refills | Status: DC

## 2020-12-02 NOTE — Progress Notes (Signed)
Hematology/Oncology Consult note [facility] [phone] [phone]   Patient Care Team: [name], Leisa, PA-C as PCP - General (Family Medicine) Gaynelle AduWilson, Eric, MD as Consulting Physician (General Surgery)  REFERRING PROVIDER: [name], Leisa, PA-C  CHIEF COMPLAINTS/REASON FOR VISIT:  Evaluation of polycytosis/erythrocytosis  HISTORY OF PRESENTING ILLNESS:  Donald [name] is a 40 y.o. male who was seen in consultation at the request of [name], Leisa, PA-C for evaluation of polycytosis/erythrocytosis Reviewed patient's recent lab work which was obtain by PCP.  11/17/2020 labs showed elevated hemoglobin at 17, hematocrit 53.1 total white count is slightly reduced at 3.4 with normal differential.  Normal platelet counts Reviewed previous labs.  Patient has slightly decreased total white blood count of 3.6 on two third 2022.  He also had slight increase of hematocrit of 50.8 on 05/21/2020. Patient is a former smoker. He has had gastric sleeve surgery.  Reports not taking his vitamins. He used to have sleep apnea and being on CPAP.  After he successfully lost weight after the surgery, he was told that he does not need to continue using CPAP anymore. Recently he has gained some weight  Denies any unintentional weight loss, night sweats, fever.  Denies any frequent infection Denies any testosterone replacement therapy  Review of Systems  Constitutional:  Negative for appetite change, chills, diaphoresis, fatigue, fever and unexpected weight change.  HENT:   Negative for hearing loss, lump/mass, nosebleeds, sore throat and voice change.   Eyes:  Negative for eye problems and icterus.  Respiratory:  Negative for chest tightness, cough, hemoptysis, shortness of breath and wheezing.   Cardiovascular:  Negative for chest pain and leg swelling.  Gastrointestinal:  Negative for abdominal distention, abdominal pain, blood in stool, diarrhea, nausea and rectal  pain.  Endocrine: Negative for hot flashes.  Genitourinary:  Negative for bladder incontinence, difficulty urinating, dysuria, frequency, hematuria and nocturia.   Musculoskeletal:  Negative for arthralgias, back pain, flank pain, gait problem and myalgias.  Skin:  Negative for itching and rash.  Neurological:  Negative for dizziness, gait problem, headaches, light-headedness, numbness and seizures.  Hematological:  Negative for adenopathy. Does not bruise/bleed easily.  Psychiatric/Behavioral:  Negative for confusion and decreased concentration. The patient is not nervous/anxious.    MEDICAL HISTORY:  Past Medical History:  Diagnosis Date   Benign essential HTN    Family hx of prostate cancer 09/10/2015   History of sleeve gastrectomy 03/21/2016   Hyperlipidemia LDL goal <100    Hypertension    Pre-diabetes    Situational anxiety     SURGICAL HISTORY: Past Surgical History:  Procedure Laterality Date   LAPAROSCOPIC GASTRIC SLEEVE RESECTION N/A 03/21/2016   Procedure: LAPAROSCOPIC GASTRIC SLEEVE RESECTION, UPPER ENDO;  Surgeon: Gaynelle AduEric [name], MD;  Location: [facility];  Service: General;  Laterality: N/A;   WISDOM TOOTH EXTRACTION      SOCIAL HISTORY: Social History   Socioeconomic History   Marital status: Married    Spouse name: Geophysicist/field seismologisthavawn   Number of children: 2   Years of education: Not on file   Highest education level: Some college, no degree  Occupational History   Not on file  Tobacco Use   Smoking status: Former    Packs/day: 0.30    Types: Cigarettes    Start date: 2008    Quit date: 2018    Years since quitting: 4.6   Smokeless tobacco: Never   Tobacco comments:    smokes at social events rarely  Vaping Use  Vaping Use: Never used  Substance and Sexual Activity   Alcohol use: Yes    Alcohol/week: 0.0 - 1.0 standard drinks    Comment: socially   Drug use: No   Sexual activity: Yes    Partners: Female  Other Topics Concern   Not on file  Social History  Narrative   Not on file   Social Determinants of Health   Financial Resource Strain: Low Risk    Difficulty of Paying Living Expenses: Not hard at all  Food Insecurity: No Food Insecurity   Worried About Programme researcher, broadcasting/film/video in the Last Year: Never true   Ran Out of Food in the Last Year: Never true  Transportation Needs: No Transportation Needs   Lack of Transportation (Medical): No   Lack of Transportation (Non-Medical): No  Physical Activity: Sufficiently Active   Days of Exercise per Week: 6 days   Minutes of Exercise per Session: 80 min  Stress: No Stress Concern Present   Feeling of Stress : Not at all  Social Connections: Socially Integrated   Frequency of Communication with Friends and Family: More than three times a week   Frequency of Social Gatherings with Friends and Family: More than three times a week   Attends Religious Services: More than 4 times per year   Active Member of [employer] or Organizations: Yes   Attends Engineer, structural: More than 4 times per year   Marital Status: Married  Catering manager Violence: Not on file    FAMILY HISTORY: Family History  Problem Relation Age of Onset   Hyperlipidemia Mother    Diabetes Mother    Diabetes Father    Prostate cancer Father    Prostate cancer Paternal Grandfather     ALLERGIES:  has No Known Allergies.  MEDICATIONS:  Current Outpatient Medications  Medication Sig Dispense Refill   allopurinol (ZYLOPRIM) 300 MG tablet Take 300 mg tab po in the morning, take 150 mg (half tab) po in evening daily for gout 45 tablet 11   Calcium 500 MG CHEW Chew by mouth 3 (three) times daily. (Patient not taking: Reported on 12/02/2020)     chlorhexidine (PERIDEX) 0.12 % solution 0.12 mLs by Mouth Rinse route 2 (two) times daily. 120 mL 6   indomethacin (INDOCIN) 25 MG capsule Take 50mg  PO TID x 2d, then 25 mg PO TID x 3 d, take PRN at onset of gout symptoms (Patient not taking: Reported on 12/02/2020) 30 capsule 2    Multiple Vitamin (MULTIVITAMIN) tablet Take 1 tablet by mouth daily. (Patient not taking: Reported on 12/02/2020)     pantoprazole (PROTONIX) 40 MG tablet Take 1 tablet (40 mg total) by mouth daily. (Patient not taking: Reported on 12/02/2020) 30 tablet 11   No current facility-administered medications for this visit.     PHYSICAL EXAMINATION: ECOG PERFORMANCE STATUS: 0 - Asymptomatic Vitals:   12/02/20 0943  BP: 122/86  Pulse: 78  Resp: 20  SpO2: 98%   Filed Weights   12/02/20 0943  Weight: 239 lb 6.4 oz (108.6 kg)    Physical Exam  RADIOGRAPHIC STUDIES: I have personally reviewed the radiological images as listed and agreed with the findings in the report. No results found.   LABORATORY DATA:  I have reviewed the data as listed Lab Results  Component Value Date   WBC 3.5 (L) 12/02/2020   HGB 16.6 12/02/2020   HCT 51.1 12/02/2020   MCV 83.0 12/02/2020   PLT 214 12/02/2020  Recent Labs    05/21/20 1029 11/17/20 0941  NA 139 140  K 5.1 5.1  CL 102 103  CO2 31 31  GLUCOSE 76 76  BUN 12 14  CREATININE 1.26 1.27*  CALCIUM 9.9 9.9  GFRNONAA 71  --   GFRAA 83  --   PROT 7.4 7.3  AST 16 15  ALT 16 18  BILITOT 0.4 0.5   Iron/TIBC/Ferritin/ %Sat No results found for: IRON, TIBC, FERRITIN, IRONPCTSAT      ASSESSMENT & PLAN:  1. Erythrocytosis   2. Neutropenia, unspecified type (HCC)   3. Folate deficiency   4. H/O gastric sleeve   #Erythrocytosis Labs are reviewed and discussed with patient. Polycythemia (erythrocytosis) is an abnormal elevation of hemoglobin (Hgb) and/or hematocrit (Hct) in peripheral blood, and this can be caused by primary etiology, ie bone marrow mutation, or secondary etiology, ie hypoxia, smoking, androgen supplements, etc. his erythrocytosis is very mild.  Likely secondary.  Rule out other etiologies. I will obtain erythropoietin, carbo monoxide level,  JAK2 with reflex to other mutations,  #History of gastric sleeve.  Mild  leukopenia. Vitamin B12, folate.  Hepatitis B surface antigen and core antibody.  Flow cytometry and SPEP.  #Folate deficiency, this may contribute to his mild leukopenia.  Recommend patient to start folic acid 1 mg daily. Prescription sent to pharmacy. Orders Placed This Encounter  Procedures   Folate    Standing Status:   Future    Number of Occurrences:   1    Standing Expiration Date:   12/02/2021   Vitamin B12    Standing Status:   Future    Number of Occurrences:   1    Standing Expiration Date:   12/02/2021   Technologist smear review    Standing Status:   Future    Number of Occurrences:   1    Standing Expiration Date:   12/02/2021   CBC with Differential/Platelet    Standing Status:   Future    Number of Occurrences:   1    Standing Expiration Date:   12/02/2021   Retic Panel    Standing Status:   Future    Number of Occurrences:   1    Standing Expiration Date:   12/02/2021   Flow cytometry panel-leukemia/lymphoma work-up    Standing Status:   Future    Number of Occurrences:   1    Standing Expiration Date:   12/02/2021   Lactate dehydrogenase    Standing Status:   Future    Number of Occurrences:   1    Standing Expiration Date:   12/02/2021   Protein electrophoresis, serum    Standing Status:   Future    Number of Occurrences:   1    Standing Expiration Date:   12/02/2021   Hepatitis B core antibody, IgM    Standing Status:   Future    Number of Occurrences:   1    Standing Expiration Date:   12/02/2021   Hepatitis B surface antigen    Standing Status:   Future    Number of Occurrences:   1    Standing Expiration Date:   12/02/2021   JAK2 V617F, w Reflex to CALR/E12/MPL    Standing Status:   Future    Number of Occurrences:   1    Standing Expiration Date:   12/02/2021    We spent sufficient time to discuss many aspect of care, questions were answered to patient's satisfaction.  The patient knows to call the clinic with any problems questions or  concerns.  Cc Danelle Berry, PA-C  Return of visit: 2 weeks to go over lab results Thank you for this kind referral and the opportunity to participate in the care of this patient. A copy of today's note is routed to referring provider    Rickard Patience, MD, PhD 12/02/2020

## 2020-12-03 ENCOUNTER — Telehealth: Payer: Self-pay

## 2020-12-03 NOTE — Telephone Encounter (Signed)
Per Dr. Cathie Hoops: pt informed that folate level I low and folic acid prescription has been sent to pharmacy. Pending blood work will be discussed at future visit. Pt verbalized understanding.

## 2020-12-04 LAB — COMP PANEL: LEUKEMIA/LYMPHOMA

## 2020-12-04 LAB — PROTEIN ELECTROPHORESIS, SERUM
A/G Ratio: 1.2 (ref 0.7–1.7)
Albumin ELP: 3.9 g/dL (ref 2.9–4.4)
Alpha-1-Globulin: 0.2 g/dL (ref 0.0–0.4)
Alpha-2-Globulin: 0.8 g/dL (ref 0.4–1.0)
Beta Globulin: 1 g/dL (ref 0.7–1.3)
Gamma Globulin: 1.3 g/dL (ref 0.4–1.8)
Globulin, Total: 3.2 g/dL (ref 2.2–3.9)
Total Protein ELP: 7.1 g/dL (ref 6.0–8.5)

## 2020-12-14 LAB — CALR + JAK2 E12-15 + MPL (REFLEXED)

## 2020-12-14 LAB — JAK2 V617F, W REFLEX TO CALR/E12/MPL

## 2020-12-16 ENCOUNTER — Encounter: Payer: Self-pay | Admitting: Oncology

## 2020-12-16 ENCOUNTER — Other Ambulatory Visit: Payer: Self-pay

## 2020-12-16 ENCOUNTER — Inpatient Hospital Stay (HOSPITAL_BASED_OUTPATIENT_CLINIC_OR_DEPARTMENT_OTHER): Payer: 59 | Admitting: Oncology

## 2020-12-16 DIAGNOSIS — D709 Neutropenia, unspecified: Secondary | ICD-10-CM | POA: Diagnosis not present

## 2020-12-16 DIAGNOSIS — Z903 Acquired absence of stomach [part of]: Secondary | ICD-10-CM

## 2020-12-16 DIAGNOSIS — E538 Deficiency of other specified B group vitamins: Secondary | ICD-10-CM

## 2020-12-16 NOTE — Progress Notes (Signed)
HEMATOLOGY-ONCOLOGY TeleHEALTH VISIT PROGRESS NOTE  I connected with Donald [name] on 12/16/20  at  2:45 PM EDT by video enabled telemedicine visit and verified that I am speaking with the correct person using two identifiers. I discussed the limitations, risks, security and privacy concerns of performing an evaluation and management service by telemedicine and the availability of in-person appointments. The patient expressed understanding and agreed to proceed.   Other persons participating in the visit and their role in the encounter:  None  Patient's location: Home  Provider's location: office Chief Complaint: Follow-up for neutropenia   INTERVAL HISTORY Donald [name] is a 40 y.o. male who has above history reviewed by me today presents for follow up visit for folate deficiency and neutropenia Problems and complaints are listed below: Patient has had blood work done since last visit. Folate level was found to be low.  Patient has started on folic acid supplementation.  Also he started taking calcium and multivitamin   Review of Systems  Constitutional:  Negative for appetite change, chills, fatigue, fever and unexpected weight change.  HENT:   Negative for hearing loss and voice change.   Eyes:  Negative for eye problems and icterus.  Respiratory:  Negative for chest tightness, cough and shortness of breath.   Cardiovascular:  Negative for chest pain and leg swelling.  Gastrointestinal:  Negative for abdominal distention and abdominal pain.  Endocrine: Negative for hot flashes.  Genitourinary:  Negative for difficulty urinating, dysuria and frequency.   Musculoskeletal:  Negative for arthralgias.  Skin:  Negative for itching and rash.  Neurological:  Negative for light-headedness and numbness.  Hematological:  Negative for adenopathy. Does not bruise/bleed easily.  Psychiatric/Behavioral:  Negative for confusion.    Past Medical History:  Diagnosis Date   Benign  essential HTN    Family hx of prostate cancer 09/10/2015   History of sleeve gastrectomy 03/21/2016   Hyperlipidemia LDL goal <100    Hypertension    Pre-diabetes    Situational anxiety    Past Surgical History:  Procedure Laterality Date   LAPAROSCOPIC GASTRIC SLEEVE RESECTION N/A 03/21/2016   Procedure: LAPAROSCOPIC GASTRIC SLEEVE RESECTION, UPPER ENDO;  Surgeon: Gaynelle Adu, MD;  Location: [facility];  Service: General;  Laterality: N/A;   WISDOM TOOTH EXTRACTION      Family History  Problem Relation Age of Onset   Hyperlipidemia Mother    Diabetes Mother    Diabetes Father    Prostate cancer Father    Prostate cancer Paternal Grandfather     Social History   Socioeconomic History   Marital status: Married    Spouse name: Geophysicist/field seismologist   Number of children: 2   Years of education: Not on file   Highest education level: Some college, no degree  Occupational History   Not on file  Tobacco Use   Smoking status: Former    Packs/day: 0.30    Types: Cigarettes    Start date: 2008    Quit date: 2018    Years since quitting: 4.6   Smokeless tobacco: Never   Tobacco comments:    smokes at social events rarely  Vaping Use   Vaping Use: Never used  Substance and Sexual Activity   Alcohol use: Yes    Alcohol/week: 0.0 - 1.0 standard drinks    Comment: socially   Drug use: No   Sexual activity: Yes    Partners: Female  Other Topics Concern   Not on file  Social History Narrative  Not on file   Social Determinants of Health   Financial Resource Strain: Low Risk    Difficulty of Paying Living Expenses: Not hard at all  Food Insecurity: No Food Insecurity   Worried About Programme researcher, broadcasting/film/video in the Last Year: Never true   Ran Out of Food in the Last Year: Never true  Transportation Needs: No Transportation Needs   Lack of Transportation (Medical): No   Lack of Transportation (Non-Medical): No  Physical Activity: Sufficiently Active   Days of Exercise per Week: 6 days    Minutes of Exercise per Session: 80 min  Stress: No Stress Concern Present   Feeling of Stress : Not at all  Social Connections: Socially Integrated   Frequency of Communication with Friends and Family: More than three times a week   Frequency of Social Gatherings with Friends and Family: More than three times a week   Attends Religious Services: More than 4 times per year   Active Member of [employer] or Organizations: Yes   Attends Engineer, structural: More than 4 times per year   Marital Status: Married  Catering manager Violence: Not on file    Current Outpatient Medications on File Prior to Visit  Medication Sig Dispense Refill   allopurinol (ZYLOPRIM) 300 MG tablet Take 300 mg tab po in the morning, take 150 mg (half tab) po in evening daily for gout 45 tablet 11   Calcium 500 MG CHEW Chew by mouth 3 (three) times daily.     chlorhexidine (PERIDEX) 0.12 % solution 0.12 mLs by Mouth Rinse route 2 (two) times daily. 120 mL 6   folic acid (FOLVITE) 1 MG tablet Take 1 tablet (1 mg total) by mouth daily. 30 tablet 2   indomethacin (INDOCIN) 25 MG capsule Take 50mg  PO TID x 2d, then 25 mg PO TID x 3 d, take PRN at onset of gout symptoms 30 capsule 2   Multiple Vitamin (MULTIVITAMIN) tablet Take 1 tablet by mouth daily.     pantoprazole (PROTONIX) 40 MG tablet Take 1 tablet (40 mg total) by mouth daily. 30 tablet 11   No current facility-administered medications on file prior to visit.    No Known Allergies     Observations/Objective: Today's Vitals   12/16/20 1322  PainSc: 0-No pain   There is no height or weight on file to calculate BMI.  Physical Exam Neurological:     Mental Status: He is alert.    CBC    Component Value Date/Time   WBC 3.5 (L) 12/02/2020 1016   RBC 6.11 (H) 12/02/2020 1017   RBC 6.16 (H) 12/02/2020 1016   HGB 16.6 12/02/2020 1016   HGB 15.9 11/19/2019 1136   HCT 51.1 12/02/2020 1016   HCT 48.1 11/19/2019 1136   PLT 214 12/02/2020 1016   PLT  208 11/19/2019 1136   MCV 83.0 12/02/2020 1016   MCV 79 11/19/2019 1136   MCH 26.9 12/02/2020 1016   MCHC 32.5 12/02/2020 1016   RDW 13.8 12/02/2020 1016   RDW 13.9 11/19/2019 1136   LYMPHSABS 1.7 12/02/2020 1016   LYMPHSABS 2.1 11/19/2019 1136   MONOABS 0.3 12/02/2020 1016   EOSABS 0.1 12/02/2020 1016   EOSABS 0.1 11/19/2019 1136   BASOSABS 0.0 12/02/2020 1016   BASOSABS 0.0 11/19/2019 1136    CMP     Component Value Date/Time   NA 140 11/17/2020 0941   NA 139 11/19/2019 1136   K 5.1 11/17/2020 0941   CL  103 11/17/2020 0941   CO2 31 11/17/2020 0941   GLUCOSE 76 11/17/2020 0941   BUN 14 11/17/2020 0941   BUN 14 11/19/2019 1136   CREATININE 1.27 (H) 11/17/2020 0941   CALCIUM 9.9 11/17/2020 0941   PROT 7.3 11/17/2020 0941   PROT 7.6 11/19/2019 1136   ALBUMIN 4.4 11/19/2019 1136   AST 15 11/17/2020 0941   ALT 18 11/17/2020 0941   ALKPHOS 82 11/19/2019 1136   BILITOT 0.5 11/17/2020 0941   BILITOT 0.4 11/19/2019 1136   GFRNONAA 71 05/21/2020 1029   GFRAA 83 05/21/2020 1029     Assessment and Plan: 1. Neutropenia, unspecified type (HCC)   2. Folate deficiency   3. H/O gastric sleeve     #Neutropenia, Labs reviewed and discussed with patient. Normal vitamin B12, negative hepatitis B core antibody and surface antigen.  Negative M protein. Flow cytometry negative.  LDH normal. Neutropenia could be secondary to ethnic neutropenia or due to folate deficiency.  #Folate deficiency Recommend patient to take folic acid supplementation. Vitamin B12 is normal, low normal end.  Recommend patient continue multivitamin.   Follow Up Instructions:  6 months  I discussed the assessment and treatment plan with the patient. The patient was provided an opportunity to ask questions and all were answered. The patient agreed with the plan and demonstrated an understanding of the instructions.  The patient was advised to call back or seek an in-person evaluation if the symptoms worsen  or if the condition fails to improve as anticipated.   Rickard Patience, MD 12/16/2020 10:29 PM

## 2021-04-07 ENCOUNTER — Encounter: Payer: Self-pay | Admitting: Family Medicine

## 2021-04-08 ENCOUNTER — Encounter: Payer: Self-pay | Admitting: Nurse Practitioner

## 2021-04-08 ENCOUNTER — Ambulatory Visit: Payer: 59 | Admitting: Nurse Practitioner

## 2021-04-08 ENCOUNTER — Other Ambulatory Visit: Payer: Self-pay

## 2021-04-08 VITALS — BP 118/72 | HR 86 | Temp 98.1°F | Resp 18 | Ht 76.0 in | Wt 246.6 lb

## 2021-04-08 DIAGNOSIS — L0291 Cutaneous abscess, unspecified: Secondary | ICD-10-CM

## 2021-04-08 MED ORDER — CEPHALEXIN 500 MG PO CAPS: 500.0000 mg | ORAL_CAPSULE | Freq: Four times a day (QID) | ORAL | 0 refills | Status: DC

## 2021-04-08 NOTE — Progress Notes (Signed)
BP 118/72    Pulse 86    Temp 98.1 F (36.7 C) (Oral)    Resp 18    Ht 6\' 4"  (1.93 m)    Wt 246 lb 9.6 oz (111.9 kg)    SpO2 98%    BMI 30.02 kg/m    Subjective:    Patient ID: [name], male    DOB: 07/22/1980, 40 y.o.   MRN: [id number]  HPI: Donald [name] is a 40 y.o. male  Chief Complaint  Patient presents with   Cyst    Under right arm since yesterday   Abscess: He says has a cyst under his right arm pit.  He says he noticed it yesterday.  It is approximately 1x2 cm it is firm.  He denies any redness, drainage or heat noted.  Discussed starting antibiotic and doing warm compresses at least four times a day.  If no improvement will return for recheck.   Relevant past medical, surgical, family and social history reviewed and updated as indicated. Interim medical history since our last visit reviewed. Allergies and medications reviewed and updated.  Review of Systems  Constitutional: Negative for fever or weight change.  Respiratory: Negative for cough and shortness of breath.   Cardiovascular: Negative for chest pain or palpitations.  Gastrointestinal: Negative for abdominal pain, no bowel changes.  Musculoskeletal: Negative for gait problem or joint swelling.  Skin: Negative for rash. Positive for abscess in right axilla Neurological: Negative for dizziness or headache.  No other specific complaints in a complete review of systems (except as listed in HPI above).      Objective:    BP 118/72    Pulse 86    Temp 98.1 F (36.7 C) (Oral)    Resp 18    Ht 6\' 4"  (1.93 m)    Wt 246 lb 9.6 oz (111.9 kg)    SpO2 98%    BMI 30.02 kg/m   Wt Readings from Last 3 Encounters:  04/08/21 246 lb 9.6 oz (111.9 kg)  12/02/20 239 lb 6.4 oz (108.6 kg)  11/17/20 237 lb 8 oz (107.7 kg)    Physical Exam Constitutional: Patient appears well-developed and well-nourished.  No distress.  HEENT: head atraumatic, normocephalic, pupils equal and reactive to light, neck  supple Cardiovascular: Normal rate, regular rhythm and normal heart sounds.  No murmur heard. No BLE edema. Pulmonary/Chest: Effort normal and breath sounds normal. No respiratory distress. Abdominal: Soft.  There is no tenderness. Skin: abscess to right axilla approx 1 x 2 cm, hard no drainage noted Psychiatric: Patient has a normal mood and affect. behavior is normal. Judgment and thought content normal.   Results for orders placed or performed in visit on 12/02/20  Technologist smear review  Result Value Ref Range   WBC MORPHOLOGY Normal RBC, WBC, and platelet    RBC MORPHOLOGY Normal RBC, WBC, and platelet    Tech Review Normal RBC, WBC, and platelet   JAK2 V617F, w Reflex to CALR/E12/MPL  Result Value Ref Range   JAK2 GenotypR Comment    BACKGROUND: Comment    Director Review, JAK2 Comment    REFLEX: Comment    Extraction Completed   Hepatitis B surface antigen  Result Value Ref Range   Hepatitis B Surface Ag NON REACTIVE NON REACTIVE  Hepatitis B core antibody, IgM  Result Value Ref Range   Hep B C IgM NON REACTIVE NON REACTIVE  Protein electrophoresis, serum  Result Value Ref Range   Total  Protein ELP 7.1 6.0 - 8.5 g/dL   Albumin ELP 3.9 2.9 - 4.4 g/dL   GBEEF-0-OFHQRFXJ 0.2 0.0 - 0.4 g/dL   OITGP-4-DIYMEBRA 0.8 0.4 - 1.0 g/dL   Beta Globulin 1.0 0.7 - 1.3 g/dL   Gamma Globulin 1.3 0.4 - 1.8 g/dL   M-Spike, % Not Observed Not Observed g/dL   SPE Interp. Comment    Comment Comment    Globulin, Total 3.2 2.2 - 3.9 g/dL   A/G Ratio 1.2 0.7 - 1.7  Lactate dehydrogenase  Result Value Ref Range   LDH 122 98 - 192 U/L  Flow cytometry panel-leukemia/lymphoma work-up  Result Value Ref Range   PATH INTERP XXX-IMP Comment    ANNOTATION COMMENT IMP Comment    CLINICAL INFO Comment    Specimen Type Comment    ASSESSMENT OF LEUKOCYTES Comment    % Viable Cells Comment    ANALYSIS AND GATING STRATEGY Comment    IMMUNOPHENOTYPING STUDY Comment    PATHOLOGIST NAME Comment     COMMENT: Comment   Retic Panel  Result Value Ref Range   Retic Ct Pct 1.1 0.4 - 3.1 %   RBC. 6.11 (H) 4.22 - 5.81 MIL/uL   Retic Count, Absolute 65.4 19.0 - 186.0 K/uL   Immature Retic Fract 4.0 2.3 - 15.9 %   Reticulocyte Hemoglobin 32.5 >27.9 pg  CBC with Differential/Platelet  Result Value Ref Range   WBC 3.5 (L) 4.0 - 10.5 K/uL   RBC 6.16 (H) 4.22 - 5.81 MIL/uL   Hemoglobin 16.6 13.0 - 17.0 g/dL   HCT 30.9 40.7 - 68.0 %   MCV 83.0 80.0 - 100.0 fL   MCH 26.9 26.0 - 34.0 pg   MCHC 32.5 30.0 - 36.0 g/dL   RDW 88.1 10.3 - 15.9 %   Platelets 214 150 - 400 K/uL   nRBC 0.0 0.0 - 0.2 %   Neutrophils Relative % 40 %   Neutro Abs 1.4 (L) 1.7 - 7.7 K/uL   Lymphocytes Relative 49 %   Lymphs Abs 1.7 0.7 - 4.0 K/uL   Monocytes Relative 9 %   Monocytes Absolute 0.3 0.1 - 1.0 K/uL   Eosinophils Relative 1 %   Eosinophils Absolute 0.1 0.0 - 0.5 K/uL   Basophils Relative 1 %   Basophils Absolute 0.0 0.0 - 0.1 K/uL   Immature Granulocytes 0 %   Abs Immature Granulocytes 0.01 0.00 - 0.07 K/uL  Vitamin B12  Result Value Ref Range   Vitamin B-12 348 180 - 914 pg/mL  Folate  Result Value Ref Range   Folate 5.3 (L) >5.9 ng/mL  CALR + JAK2 E12-15 + MPL (reflexed)  Result Value Ref Range   CALR Mutation Detection Result Comment    Background: Comment    Methodology: Comment    References: Comment    Director Review Comment    JAK2 Exons 12-15 Mut Det PCR: Comment    BACKGROUND: Comment    Method Comment    References Comment    DIRECTOR REVIEW: Comment    MPL MUTATION ANALYSIS RESULT: Comment    BACKGROUND: Comment    METHODOLOGY: Comment    REFERENCES: Comment    DIRECTOR REVIEW: Comment    Extraction Comment       Assessment & Plan:   1. Abscess -warm compresses at least four times a day - cephALEXin (KEFLEX) 500 MG capsule; Take 1 capsule (500 mg total) by mouth 4 (four) times daily for 7 days.  Dispense: 28 capsule;  Refill: 0   Follow up plan: Return in about 1 week  (around 04/15/2021) for follow up.

## 2021-04-13 ENCOUNTER — Other Ambulatory Visit: Payer: Self-pay

## 2021-04-13 ENCOUNTER — Ambulatory Visit (HOSPITAL_COMMUNITY)
Admission: EM | Admit: 2021-04-13 | Discharge: 2021-04-13 | Disposition: A | Discharge status: Home / Self Care | Payer: 59 | Attending: Physician Assistant | Admitting: Physician Assistant

## 2021-04-13 ENCOUNTER — Encounter (HOSPITAL_COMMUNITY): Payer: Self-pay | Admitting: Emergency Medicine

## 2021-04-13 DIAGNOSIS — L02411 Cutaneous abscess of right axilla: Secondary | ICD-10-CM | POA: Diagnosis not present

## 2021-04-13 DIAGNOSIS — L02419 Cutaneous abscess of limb, unspecified: Secondary | ICD-10-CM

## 2021-04-13 DIAGNOSIS — L739 Follicular disorder, unspecified: Secondary | ICD-10-CM | POA: Diagnosis not present

## 2021-04-13 MED ORDER — DOXYCYCLINE HYCLATE 100 MG PO CAPS: 100.0000 mg | ORAL_CAPSULE | Freq: Two times a day (BID) | ORAL | 0 refills | Status: DC

## 2021-04-13 MED ORDER — MUPIROCIN 2 % EX OINT: 1.0000 "application " | TOPICAL_OINTMENT | Freq: Every day | CUTANEOUS | 0 refills | Status: DC

## 2021-04-13 NOTE — ED Triage Notes (Signed)
Pt is present today with an abscess under his right armpit. Pt states that he noticed it last week. Pt states he is currently taking antibiotics for his abscesses but noticed more appearing since then

## 2021-04-13 NOTE — ED Provider Notes (Signed)
[facility]    CSN: [id number] Arrival date & time: 04/13/21  1106      History   Chief Complaint Chief Complaint  Patient presents with   Abscess    HPI Donald [name] is a 40 y.o. male.   Patient presents today with a week long history of painful nodules in his right axilla.  Reports he was seen by his PCP 04/08/2021 and was prescribed cephalexin.  He reports initial lesions have decreased in size but he developed another lesion that is enlarging and becoming more painful.  He denies any history of recurrent skin infections or MRSA.  Denies history of episodes of similar symptoms in the past or hidradenitis suppurativa.  He does report changing his soap wonders if this could be contributing to symptoms.  Denies any additional changes to his personal hygiene process including detergents.  He denies any fever, nausea, vomiting, body aches.  Reports pain is rated 6 on a 0-10 pain scale, localized to affected area, described as aching, worse with palpation, no alleviating factors identified.   Past Medical History:  Diagnosis Date   Benign essential HTN    Family hx of prostate cancer 09/10/2015   History of sleeve gastrectomy 03/21/2016   Hyperlipidemia LDL goal <100    Hypertension    Pre-diabetes    Situational anxiety     Patient Active Problem List   Diagnosis Date Noted   Folate deficiency 12/02/2020   Neutropenia (HCC) 12/02/2020   Erythrocytosis 12/02/2020   Medication monitoring encounter 05/31/2016   Hypertriglyceridemia 03/21/2016   S/P laparoscopic sleeve gastrectomy 03/21/2016   Preventative health care 09/10/2015   Family hx of prostate cancer 09/10/2015   Situational anxiety 06/26/2015   Pre-diabetes 02/20/2015   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 tophus 02/20/2015   Hyperlipidemia LDL goal <100 01/27/2015    Past Surgical History:  Procedure Laterality Date   LAPAROSCOPIC GASTRIC SLEEVE RESECTION N/A 03/21/2016    Procedure: LAPAROSCOPIC GASTRIC SLEEVE RESECTION, UPPER ENDO;  Surgeon: Gaynelle Adu, MD;  Location: [facility];  Service: General;  Laterality: N/A;   WISDOM TOOTH EXTRACTION         Home Medications    Prior to Admission medications   Medication Sig Start Date End Date Taking? Authorizing Provider  doxycycline (VIBRAMYCIN) 100 MG capsule Take 1 capsule (100 mg total) by mouth 2 (two) times daily. 04/13/21  Yes Ryo Klang, Denny Peon K, PA-C  mupirocin ointment (BACTROBAN) 2 % Apply 1 application topically daily. 04/13/21  Yes Fernande Treiber, Noberto Retort, PA-C  allopurinol (ZYLOPRIM) 300 MG tablet Take 300 mg tab po in the morning, take 150 mg (half tab) po in evening daily for gout 05/21/20   Danelle Berry, PA-C  Calcium 500 MG CHEW Chew by mouth 3 (three) times daily.    [provider]  chlorhexidine (PERIDEX) 0.12 % solution 0.12 mLs by Mouth Rinse route 2 (two) times daily. 09/03/18   Poulose, Percell Belt, NP  folic acid (FOLVITE) 1 MG tablet Take 1 tablet (1 mg total) by mouth daily. 12/02/20   Rickard Patience, MD  indomethacin (INDOCIN) 25 MG capsule Take 50mg  PO TID x 2d, then 25 mg PO TID x 3 d, take PRN at onset of gout symptoms 05/21/20   07/19/20, PA-C  Multiple Vitamin (MULTIVITAMIN) tablet Take 1 tablet by mouth daily.    [provider]  pantoprazole (PROTONIX) 40 MG tablet Take 1 tablet (40 mg total) by mouth daily. 05/21/20   07/19/20, PA-C  Family History Family History  Problem Relation Age of Onset   Hyperlipidemia Mother    Diabetes Mother    Diabetes Father    Prostate cancer Father    Prostate cancer Paternal Grandfather     Social History Social History   Tobacco Use   Smoking status: Former    Packs/day: 0.30    Types: Cigarettes    Start date: 2008    Quit date: 2018    Years since quitting: 4.9   Smokeless tobacco: Never   Tobacco comments:    smokes at social events rarely  Vaping Use   Vaping Use: Never used  Substance Use Topics   Alcohol use: Yes     Alcohol/week: 0.0 - 1.0 standard drinks    Comment: socially   Drug use: No     Allergies   Patient has no known allergies.   Review of Systems Review of Systems  Constitutional:  Positive for activity change. Negative for appetite change, fatigue and fever.  Respiratory:  Negative for cough and shortness of breath.   Cardiovascular:  Negative for chest pain.  Gastrointestinal:  Negative for abdominal pain, diarrhea, nausea and vomiting.  Skin:  Positive for color change and wound.  Neurological:  Negative for dizziness, light-headedness and headaches.    Physical Exam Triage Vital Signs ED Triage Vitals  Enc Vitals Group     BP 04/13/21 1203 126/80     Pulse Rate 04/13/21 1203 71     Resp 04/13/21 1203 17     Temp 04/13/21 1203 (!) 97.3 F (36.3 C)     Temp Source 04/13/21 1203 Oral     SpO2 04/13/21 1203 100 %     Weight --      Height --      Head Circumference --      Peak Flow --      Pain Score 04/13/21 1202 6     Pain Loc --      Pain Edu? --      Excl. in [place]? --    No data found.  Updated Vital Signs BP 126/80    Pulse 71    Temp (!) 97.3 F (36.3 C) (Oral)    Resp 17    SpO2 100%   Visual Acuity Right Eye Distance:   Left Eye Distance:   Bilateral Distance:    Right Eye Near:   Left Eye Near:    Bilateral Near:     Physical Exam Vitals reviewed.  Constitutional:      General: He is awake.     Appearance: Normal appearance. He is well-developed. He is not ill-appearing.     Comments: Very pleasant male appears stated age in no acute distress sitting comfortably in exam room  HENT:     Head: Normocephalic and atraumatic.     Mouth/Throat:     Pharynx: No oropharyngeal exudate, posterior oropharyngeal erythema or uvula swelling.  Cardiovascular:     Rate and Rhythm: Normal rate and regular rhythm.     Heart sounds: Normal heart sounds, S1 normal and S2 normal. No murmur heard. Pulmonary:     Effort: Pulmonary effort is normal.     Breath  sounds: Normal breath sounds. No stridor. No wheezing, rhonchi or rales.     Comments: Clear to auscultation bilaterally Abdominal:     Palpations: Abdomen is soft.     Tenderness: There is no abdominal tenderness.  Skin:    Findings: Rash present. Rash is nodular.  Comments: Multiple nodules ranging in size from 2 cm to 0.5 cm noted in right axilla.  Largest nodule has central induration without significant fluctuance.  No active bleeding or drainage noted.  No streaking or evidence of lymphangitis.  Neurological:     Mental Status: He is alert.  Psychiatric:        Behavior: Behavior is cooperative.     UC Treatments / Results  Labs (all labs ordered are listed, but only abnormal results are displayed) Labs Reviewed - No data to display  EKG   Radiology No results found.  Procedures Procedures (including critical care time)  Medications Ordered in UC Medications - No data to display  Initial Impression / Assessment and Plan / UC Course  I have reviewed the triage vital signs and the nursing notes.  Pertinent labs & imaging results that were available during my care of the patient were reviewed by me and considered in my medical decision making (see chart for details).     Lesion present on exam has central induration without significant fluctuance so I&D procedure was deferred.  Recommended conservative treatment including warm compresses, use of antibacterial soap, application of Bactroban ointment daily.  Discussed that if lesion enlarges we can consider I&D in the future.  Given recurrent symptoms despite use of cephalexin will stop the cephalexin and start doxycycline 100 mg for additional coverage.  He can use over-the-counter analgesics for symptom relief.  Discussed alarm symptoms that warrant emergent evaluation including enlarging lesion, increased pain, fever, nausea, vomiting, body aches.  Strict return precautions given to which she expressed understanding.   Work excuse note provided as requested.  Final Clinical Impressions(s) / UC Diagnoses   Final diagnoses:  Folliculitis  Axillary abscess     Discharge Instructions      We are going to switch your antibiotics.  Please stop cephalexin and start doxycycline twice daily.  Use Bactroban over affected lesions.  I recommend warm compresses multiple times per day.  As we discussed, you should switch your soap to something that is antibacterial.  If this area becomes larger I do recommend you return so we can consider draining it.  If you develop any worsening symptoms including fever, nausea, vomiting, increased pain, additional lesions you need to be seen immediately.     ED Prescriptions     Medication Sig Dispense Auth. Provider   doxycycline (VIBRAMYCIN) 100 MG capsule Take 1 capsule (100 mg total) by mouth 2 (two) times daily. 20 capsule Nasiyah Laverdiere K, PA-C   mupirocin ointment (BACTROBAN) 2 % Apply 1 application topically daily. 22 g Skylynn Burkley K, PA-C      PDMP not reviewed this encounter.   Terrilee Croak, PA-C 04/13/21 1224

## 2021-04-13 NOTE — Discharge Instructions (Signed)
We are going to switch your antibiotics.  Please stop cephalexin and start doxycycline twice daily.  Use Bactroban over affected lesions.  I recommend warm compresses multiple times per day.  As we discussed, you should switch your soap to something that is antibacterial.  If this area becomes larger I do recommend you return so we can consider draining it.  If you develop any worsening symptoms including fever, nausea, vomiting, increased pain, additional lesions you need to be seen immediately.

## 2021-04-26 ENCOUNTER — Inpatient Hospital Stay: Payer: 59 | Attending: Oncology

## 2021-04-28 ENCOUNTER — Inpatient Hospital Stay: Payer: 59 | Admitting: Oncology

## 2021-05-18 ENCOUNTER — Ambulatory Visit: Payer: 59 | Admitting: Internal Medicine

## 2021-05-18 ENCOUNTER — Encounter: Payer: Self-pay | Admitting: Nurse Practitioner

## 2021-05-18 ENCOUNTER — Encounter: Payer: Self-pay | Admitting: Internal Medicine

## 2021-05-18 VITALS — BP 114/70 | HR 100 | Temp 98.2°F | Resp 16 | Ht 76.0 in | Wt 242.0 lb

## 2021-05-18 DIAGNOSIS — S39012A Strain of muscle, fascia and tendon of lower back, initial encounter: Secondary | ICD-10-CM

## 2021-05-18 MED ORDER — NAPROXEN 500 MG PO TABS
500.0000 mg | ORAL_TABLET | Freq: Two times a day (BID) | ORAL | 0 refills | Status: AC
Start: 2021-05-18 — End: 2021-05-28

## 2021-05-18 MED ORDER — TIZANIDINE HCL 2 MG PO CAPS: 2.0000 mg | ORAL_CAPSULE | Freq: Three times a day (TID) | ORAL | 0 refills | Status: DC

## 2021-05-18 NOTE — Patient Instructions (Addendum)
It was great seeing you today!  Plan discussed at today's visit: -Conservative measures such as rest, moist heat, IcyHot/BenGay to help relax and stretch muscles. Also anti-inflammatory to take twice a day for 10 days to help relieve pain, do not take it with other anti-inflammatory medications like Ibuprofen, Aleve, etc and take the medication with food -Muscle relaxer to take at night  Follow up in: as needed  Take care and let us know if you have any questions or concerns prior to your next visit.  Dr. Caralee Ates  Lumbar Strain A lumbar strain, which is sometimes called a low-back strain, is a stretch or tear in a muscle or the strong cords of tissue that attach muscle to bone (tendons) in the lower back (lumbar spine). This type of injury occurs when muscles or tendons are torn or are stretched beyond their limits. Lumbar strains can range from mild to severe. Mild strains may involve stretching a muscle or tendon without tearing it. These may heal in 1-2 weeks. More severe strains involve tearing of muscle fibers or tendons. These will cause more pain and may take 6-8 weeks to heal. What are the causes? This condition may be caused by: Trauma, such as a fall or a hit to the body. Twisting or overstretching the back. This may result from doing activities that need a lot of energy, such as lifting heavy objects. What increases the risk? This injury is more common in: Athletes. People with obesity. People who do repeated lifting, bending, or other movements that involve their back. What are the signs or symptoms? Symptoms of this condition may include: Sharp or dull pain in the lower back that does not go away. The pain may extend to the buttocks. Stiffness or limited range of motion. Sudden muscle tightening (spasms). How is this diagnosed? This condition may be diagnosed based on: Your symptoms. Your medical history. A physical exam. Imaging tests, such as: X-rays. MRI. How is  this treated? Treatment for this condition may include: Rest. Applying heat and cold to the affected area. Over-the-counter medicines to help relieve pain and inflammation, such as NSAIDs. Prescription pain medicine and muscle relaxants may be needed for a short time. Physical therapy. Follow these instructions at home: Managing pain, stiffness, and swelling    If directed, put ice on the injured area during the first 24 hours after your injury. Put ice in a plastic bag. Place a towel between your skin and the bag. Leave the ice on for 20 minutes, 2-3 times a day. If directed, apply heat to the affected area as often as told by your health care provider. Use the heat source that your health care provider recommends, such as a moist heat pack or a heating pad. Place a towel between your skin and the heat source. Leave the heat on for 20-30 minutes. Remove the heat if your skin turns bright red. This is especially important if you are unable to feel pain, heat, or cold. You may have a greater risk of getting burned. Activity Rest and return to your normal activities as told by your health care provider. Ask your health care provider what activities are safe for you. Do exercises as told by your health care provider. Medicines Take over-the-counter and prescription medicines only as told by your health care provider. Ask your health care provider if the medicine prescribed to you: Requires you to avoid driving or using heavy machinery. Can cause constipation. You may need to take these actions to  prevent or treat constipation: Drink enough fluid to keep your urine pale yellow. Take over-the-counter or prescription medicines. Eat foods that are high in fiber, such as beans, whole grains, and fresh fruits and vegetables. Limit foods that are high in fat and processed sugars, such as fried or sweet foods. Injury prevention To prevent a future low-back injury: Always warm up properly before  physical activity or sports. Cool down and stretch after being active. Use correct form when playing sports and lifting heavy objects. Bend your knees before you lift heavy objects. Use good posture when sitting and standing. Stay physically fit and keep a healthy weight. Do at least 150 minutes of moderate-intensity exercise each week, such as brisk walking or water aerobics. Do strength exercises at least 2 times each week.   General instructions Do not use any products that contain nicotine or tobacco, such as cigarettes, e-cigarettes, and chewing tobacco. If you need help quitting, ask your health care provider. Keep all follow-up visits as told by your health care provider. This is important. Contact a health care provider if: Your back pain does not improve after 6 weeks of treatment. Your symptoms get worse. Get help right away if: Your back pain is severe. You are unable to stand or walk. You develop pain in your legs. You develop weakness in your buttocks or legs. You have difficulty controlling when you urinate or when you have a bowel movement. You have frequent, painful, or bloody urination. You have a temperature over 101.64F (38.3C) Summary A lumbar strain, which is sometimes called a low-back strain, is a stretch or tear in a muscle or the strong cords of tissue that attach muscle to bone (tendons) in the lower back (lumbar spine). This type of injury occurs when muscles or tendons are torn or are stretched beyond their limits. Rest and return to your normal activities as told by your health care provider. If directed, apply heat and ice to the affected area as often as told by your health care provider. Take over-the-counter and prescription medicines only as told by your health care provider. Contact a health care provider if you have new or worsening symptoms. This information is not intended to replace advice given to you by your health care provider. Make sure you  discuss any questions you have with your health care provider. Document Revised: 02/01/2018 Document Reviewed: 02/01/2018 Elsevier Patient Education  2022 [employer]    Sciatica Rehab Ask your health care provider which exercises are safe for you. Do exercises exactly as told by your health care provider and adjust them as directed. It is normal to feel mild stretching, pulling, tightness, or discomfort as you do these exercises. Stop right away if you feel sudden pain or your pain gets worse. Do not begin these exercises until told by your health care provider. Stretching and range-of-motion exercises These exercises warm up your muscles and joints and improve the movement and flexibility of your hips and back. These exercises also help to relieve pain, numbness, and tingling. Sciatic nerve glide Sit in a chair with your head facing down toward your chest. Place your hands behind your back. Let your shoulders slump forward. Slowly straighten one of your legs while you tilt your head back as if you are looking toward the ceiling. Only straighten your leg as far as you can without making your symptoms worse. Hold this position for __________ seconds. Slowly return to the starting position. Repeat with your other leg. Repeat __________ times.  Complete this exercise __________ times a day. Knee to chest with hip adduction and internal rotation Lie on your back on a firm surface with both legs straight. Bend one of your knees and move it up toward your chest until you feel a gentle stretch in your lower back and buttock. Then, move your knee toward the shoulder that is on the opposite side from your leg. This is hip adduction and internal rotation. Hold your leg in this position by holding on to the front of your knee. Hold this position for __________ seconds. Slowly return to the starting position. Repeat with your other leg. Repeat __________ times. Complete this exercise __________ times a  day. Prone extension on elbows Lie on your abdomen on a firm surface. A bed may be too soft for this exercise. Prop yourself up on your elbows. Use your arms to help lift your chest up until you feel a gentle stretch in your abdomen and your lower back. This will place some of your body weight on your elbows. If this is uncomfortable, try stacking pillows under your chest. Your hips should stay down, against the surface that you are lying on. Keep your hip and back muscles relaxed. Hold this position for __________ seconds. Slowly relax your upper body and return to the starting position. Repeat __________ times. Complete this exercise __________ times a day. Strengthening exercises These exercises build strength and endurance in your back. Endurance is the ability to use your muscles for a long time, even after they get tired. Pelvic tilt This exercise strengthens the muscles that lie deep in the abdomen. Lie on your back on a firm surface. Bend your knees and keep your feet flat on the floor. Tense your abdominal muscles. Tip your pelvis up toward the ceiling and flatten your lower back into the floor. To help with this exercise, you may place a small towel under your lower back and try to push your back into the towel. Hold this position for __________ seconds. Let your muscles relax completely before you repeat this exercise. Repeat __________ times. Complete this exercise __________ times a day. Alternating arm and leg raises Get on your hands and knees on a firm surface. If you are on a hard floor, you may want to use padding, such as an exercise mat, to cushion your knees. Line up your arms and legs. Your hands should be directly below your shoulders, and your knees should be directly below your hips. Lift your left leg behind you. At the same time, raise your right arm and straighten it in front of you. Do not lift your leg higher than your hip. Do not lift your arm higher than your  shoulder. Keep your abdominal and back muscles tight. Keep your hips facing the ground. Do not arch your back. Keep your balance carefully, and do not hold your breath. Hold this position for __________ seconds. Slowly return to the starting position. Repeat with your right leg and your left arm. Repeat __________ times. Complete this exercise __________ times a day. Posture and body mechanics Good posture and healthy body mechanics can help to relieve stress in your body's tissues and joints. Body mechanics refers to the movements and positions of your body while you do your daily activities. Posture is part of body mechanics. Good posture means: Your spine is in its natural S-curve position (neutral). Your shoulders are pulled back slightly. Your head is not tipped forward. Follow these guidelines to improve your posture and body  mechanics in your everyday activities. Standing When standing, keep your spine neutral and your feet about hip width apart. Keep a slight bend in your knees. Your ears, shoulders, and hips should line up. When you do a task in which you stand in one place for a long time, place one foot up on a stable object that is 2-4 inches (5-10 cm) high, such as a footstool. This helps keep your spine neutral. Sitting When sitting, keep your spine neutral and keep your feet flat on the floor. Use a footrest, if necessary, and keep your thighs parallel to the floor. Avoid rounding your shoulders, and avoid tilting your head forward. When working at a desk or a computer, keep your desk at a height where your hands are slightly lower than your elbows. Slide your chair under your desk so you are close enough to maintain good posture. When working at a computer, place your monitor at a height where you are looking straight ahead and you do not have to tilt your head forward or downward to look at the screen. Resting When lying down and resting, avoid positions that are most painful  for you. If you have pain with activities such as sitting, bending, stooping, or squatting, lie in a position in which your body does not bend very much. For example, avoid curling up on your side with your arms and knees near your chest (fetal position). If you have pain with activities such as standing for a long time or reaching with your arms, lie with your spine in a neutral position and bend your knees slightly. Try the following positions: Lying on your side with a pillow between your knees. Lying on your back with a pillow under your knees. Lifting When lifting objects, keep your feet at least shoulder width apart and tighten your abdominal muscles. Bend your knees and hips and keep your spine neutral. It is important to lift using the strength of your legs, not your back. Do not lock your knees straight out. Always ask for help to lift heavy or awkward objects. This information is not intended to replace advice given to you by your health care provider. Make sure you discuss any questions you have with your health care provider. Document Revised: 07/27/2018 Document Reviewed: 04/26/2018 Elsevier Patient Education  2022 [employer].

## 2021-05-18 NOTE — Progress Notes (Signed)
Acute Office Visit  Subjective:    Patient ID: Donald [name], male    DOB: [birth year], 41 y.o.   MRN: [id number]  Chief Complaint  Patient presents with   Back Pain    Low started Saturday.  Went to party and was dancing    HPI Patient is in today for back pain.   BACK PAIN Duration: 4 days Mechanism of injury:  had a birthday party on Saturday night, was dancing and more active  Location: low back bilaterally but worse on right Onset: sudden Severity: severe Quality: sore and throbbing Frequency: constant Radiation: none Aggravating factors: movement, bending, and prolonged sitting Alleviating factors: nothing Status: stable Treatments attempted: none  Relief with NSAIDs?: No NSAIDs Taken Nighttime pain:  no Paresthesias / decreased sensation:  no Bowel / bladder incontinence:  no Fevers:  no Dysuria / urinary frequency:  no   Past Medical History:  Diagnosis Date   Benign essential HTN    Family hx of prostate cancer 09/10/2015   History of sleeve gastrectomy 03/21/2016   Hyperlipidemia LDL goal <100    Hypertension    Pre-diabetes    Situational anxiety     Past Surgical History:  Procedure Laterality Date   LAPAROSCOPIC GASTRIC SLEEVE RESECTION N/A 03/21/2016   Procedure: LAPAROSCOPIC GASTRIC SLEEVE RESECTION, UPPER ENDO;  Surgeon: Greer Pickerel, MD;  Location: [facility];  Service: General;  Laterality: N/A;   WISDOM TOOTH EXTRACTION      Family History  Problem Relation Age of Onset   Hyperlipidemia Mother    Diabetes Mother    Diabetes Father    Prostate cancer Father    Prostate cancer Paternal Grandfather     Social History   Socioeconomic History   Marital status: Married    Spouse name: Quarry manager   Number of children: 2   Years of education: Not on file   Highest education level: Some college, no degree  Occupational History   Not on file  Tobacco Use   Smoking status: Former    Packs/day: 0.30    Types: Cigarettes    Start date:  2008    Quit date: 2018    Years since quitting: 5.0   Smokeless tobacco: Never   Tobacco comments:    smokes at social events rarely  Vaping Use   Vaping Use: Never used  Substance and Sexual Activity   Alcohol use: Yes    Alcohol/week: 0.0 - 1.0 standard drinks    Comment: socially   Drug use: No   Sexual activity: Yes    Partners: Female  Other Topics Concern   Not on file  Social History Narrative   Not on file   Social Determinants of Health   Financial Resource Strain: Low Risk    Difficulty of Paying Living Expenses: Not hard at all  Food Insecurity: No Food Insecurity   Worried About Charity fundraiser in the Last Year: Never true   [place] in the Last Year: Never true  Transportation Needs: No Transportation Needs   Lack of Transportation (Medical): No   Lack of Transportation (Non-Medical): No  Physical Activity: Sufficiently Active   Days of Exercise per Week: 6 days   Minutes of Exercise per Session: 80 min  Stress: No Stress Concern Present   Feeling of Stress : Not at all  Social Connections: Socially Integrated   Frequency of Communication with Friends and Family: More than three times a week   Frequency of  Social Gatherings with Friends and Family: More than three times a week   Attends Religious Services: More than 4 times per year   Active Member of [employer] or Organizations: Yes   Attends Music therapist: More than 4 times per year   Marital Status: Married  Human resources officer Violence: Not on file    Outpatient Medications Prior to Visit  Medication Sig Dispense Refill   allopurinol (ZYLOPRIM) 300 MG tablet Take 300 mg tab po in the morning, take 150 mg (half tab) po in evening daily for gout 45 tablet 11   Calcium 500 MG CHEW Chew by mouth 3 (three) times daily.     chlorhexidine (PERIDEX) 0.12 % solution 0.12 mLs by Mouth Rinse route 2 (two) times daily. 120 mL 6   doxycycline (VIBRAMYCIN) 100 MG capsule Take 1 capsule (100 mg  total) by mouth 2 (two) times daily. 20 capsule 0   folic acid (FOLVITE) 1 MG tablet Take 1 tablet (1 mg total) by mouth daily. 30 tablet 2   indomethacin (INDOCIN) 25 MG capsule Take 23m PO TID x 2d, then 25 mg PO TID x 3 d, take PRN at onset of gout symptoms 30 capsule 2   Multiple Vitamin (MULTIVITAMIN) tablet Take 1 tablet by mouth daily.     mupirocin ointment (BACTROBAN) 2 % Apply 1 application topically daily. 22 g 0   pantoprazole (PROTONIX) 40 MG tablet Take 1 tablet (40 mg total) by mouth daily. 30 tablet 11   No facility-administered medications prior to visit.    No Known Allergies  Review of Systems  Constitutional:  Negative for chills and fever.  Genitourinary:  Negative for difficulty urinating and dysuria.  Musculoskeletal:  Positive for back pain and gait problem.  Skin: Negative.   Neurological:  Negative for weakness and numbness.      Objective:    Physical Exam Constitutional:      Appearance: Normal appearance.  HENT:     Head: Normocephalic and atraumatic.  Eyes:     Conjunctiva/sclera: Conjunctivae normal.  Cardiovascular:     Rate and Rhythm: Normal rate and regular rhythm.  Pulmonary:     Effort: Pulmonary effort is normal.     Breath sounds: Normal breath sounds.  Musculoskeletal:        General: Tenderness present.     Right lower leg: No edema.     Left lower leg: No edema.  Skin:    General: Skin is warm and dry.  Neurological:     General: No focal deficit present.     Mental Status: He is alert. Mental status is at baseline.  Psychiatric:        Mood and Affect: Mood normal.        Behavior: Behavior normal.   Back Exam:    Inspection:  Normal spinal curvature.  No deformity, ecchymosis, erythema, or lesions   Curvature: Normal   Deformity: no  Ecchymosis: no   Erythema:  no   Lesions: no    Palpation:     Midline spinal tenderness: yes lumbar    Paralumbar tenderness: yes bilateral     Parathoracic tenderness: no         Buttocks tenderness: no      Range of Motion:      Flexion: Fingers to Knees     Extension:Decreased     Lateral bending:Normal    Rotation:Normal    Neuro Exam:Lower extremity DTRs normal & symmetric.  Strength and sensation intact.  BP 114/70    Pulse 100    Temp 98.2 F (36.8 C)    Resp 16    Ht _0  (1.93 m)    Wt 242 lb (109.8 kg)    SpO2 99%    BMI 29.46 kg/m  Wt Readings from Last 3 Encounters:  05/18/21 242 lb (109.8 kg)  04/08/21 246 lb 9.6 oz (111.9 kg)  12/02/20 239 lb 6.4 oz (108.6 kg)    Health Maintenance Due  Topic Date Due   COVID-19 Vaccine (4 - Booster for Pfizer series) 03/11/2020   INFLUENZA VACCINE  11/16/2020    There are no preventive care reminders to display for this patient.   Lab Results  Component Value Date   TSH 2.82 02/01/2016   Lab Results  Component Value Date   WBC 3.5 (L) 12/02/2020   HGB 16.6 12/02/2020   HCT 51.1 12/02/2020   MCV 83.0 12/02/2020   PLT 214 12/02/2020   Lab Results  Component Value Date   NA 140 11/17/2020   K 5.1 11/17/2020   CO2 31 11/17/2020   GLUCOSE 76 11/17/2020   BUN 14 11/17/2020   CREATININE 1.27 (H) 11/17/2020   BILITOT 0.5 11/17/2020   ALKPHOS 82 11/19/2019   AST 15 11/17/2020   ALT 18 11/17/2020   PROT 7.3 11/17/2020   ALBUMIN 4.4 11/19/2019   CALCIUM 9.9 11/17/2020   ANIONGAP 10 03/22/2016   EGFR 74 11/17/2020   Lab Results  Component Value Date   CHOL 212 (H) 11/17/2020   Lab Results  Component Value Date   HDL 67 11/17/2020   Lab Results  Component Value Date   LDLCALC 126 (H) 11/17/2020   Lab Results  Component Value Date   TRIG 89 11/17/2020   Lab Results  Component Value Date   CHOLHDL 3.2 11/17/2020   Lab Results  Component Value Date   HGBA1C 5.3 11/17/2020       Assessment & Plan:   1. Strain of lumbar region, initial encounter: Discussed gentle stretching, stretches provided. Conservative treatment including rest (work note given), moist heat, topical  treatments like BenGay/IcyHot. Will also treat with anti-inflammatory x 10 days and low dose muscle relaxer. Discussed taking medication with food and holding all other NSAIDs. Follow up as needed.   - naproxen (NAPROSYN) 500 MG tablet; Take 1 tablet (500 mg total) by mouth 2 (two) times daily with a meal for 10 days.  Dispense: 20 tablet; Refill: 0 - tizanidine (ZANAFLEX) 2 MG capsule; Take 1 capsule (2 mg total) by mouth 3 (three) times daily.  Dispense: 30 capsule; Refill: 0   Teodora Medici, DO

## 2021-08-01 ENCOUNTER — Encounter: Payer: Self-pay | Admitting: Family Medicine

## 2021-08-01 DIAGNOSIS — M1A09X0 Idiopathic chronic gout, multiple sites, without tophus (tophi): Secondary | ICD-10-CM

## 2021-08-01 DIAGNOSIS — M109 Gout, unspecified: Secondary | ICD-10-CM

## 2021-08-02 MED ORDER — INDOMETHACIN 25 MG PO CAPS: ORAL_CAPSULE | ORAL | 2 refills | Status: DC

## 2021-08-02 MED ORDER — ALLOPURINOL 300 MG PO TABS: ORAL_TABLET | ORAL | 11 refills | Status: DC

## 2021-09-10 ENCOUNTER — Ambulatory Visit: Payer: 59 | Admitting: Family Medicine

## 2021-09-20 ENCOUNTER — Encounter: Payer: Self-pay | Admitting: Family Medicine

## 2021-09-20 ENCOUNTER — Ambulatory Visit: Payer: 59 | Admitting: Family Medicine

## 2021-09-20 VITALS — BP 118/68 | HR 81 | Temp 98.5°F | Resp 16 | Ht 77.0 in | Wt 246.9 lb

## 2021-09-20 DIAGNOSIS — E785 Hyperlipidemia, unspecified: Secondary | ICD-10-CM

## 2021-09-20 DIAGNOSIS — E663 Overweight: Secondary | ICD-10-CM

## 2021-09-20 DIAGNOSIS — E538 Deficiency of other specified B group vitamins: Secondary | ICD-10-CM

## 2021-09-20 DIAGNOSIS — D709 Neutropenia, unspecified: Secondary | ICD-10-CM

## 2021-09-20 DIAGNOSIS — R7303 Prediabetes: Secondary | ICD-10-CM

## 2021-09-20 DIAGNOSIS — Z0289 Encounter for other administrative examinations: Secondary | ICD-10-CM

## 2021-09-20 DIAGNOSIS — M109 Gout, unspecified: Secondary | ICD-10-CM

## 2021-09-20 DIAGNOSIS — E790 Hyperuricemia without signs of inflammatory arthritis and tophaceous disease: Secondary | ICD-10-CM

## 2021-09-20 DIAGNOSIS — K2100 Gastro-esophageal reflux disease with esophagitis, without bleeding: Secondary | ICD-10-CM

## 2021-09-20 DIAGNOSIS — Z5181 Encounter for therapeutic drug level monitoring: Secondary | ICD-10-CM

## 2021-09-20 DIAGNOSIS — M1A09X0 Idiopathic chronic gout, multiple sites, without tophus (tophi): Secondary | ICD-10-CM

## 2021-09-20 MED ORDER — PANTOPRAZOLE SODIUM 40 MG PO TBEC: 40.0000 mg | DELAYED_RELEASE_TABLET | Freq: Every day | ORAL | 11 refills | Status: DC

## 2021-09-20 NOTE — Assessment & Plan Note (Signed)
Prior consult with hematology, likely patient's baseline, will continue to monitor

## 2021-09-20 NOTE — Assessment & Plan Note (Signed)
Continue recommended supplement

## 2021-09-20 NOTE — Patient Instructions (Signed)
Low-Purine Eating Plan A low-purine eating plan involves making food choices to limit your purine intake. Purine is a kind of uric acid. Too much uric acid in your blood can cause certain conditions, such as gout and kidney stones. Eating a low-purine diet may help control these conditions. What are tips for following this plan? Shopping Avoid buying products that contain high-fructose corn syrup. Check for this on food labels. It is commonly found in many processed foods and soft drinks. Be sure to check for it in baked goods such as cookies, canned fruits, and cereals and cereal bars. Avoid buying veal, chicken breast with skin, lamb, and organ meats such as liver. These types of meats tend to have the highest purine content. Choose dairy products. These may lower uric acid levels. Avoid certain types of fish. Not all fish and seafood have high purine content. Examples with high purine content include anchovies, trout, tuna, sardines, and salmon. Avoid buying beverages that contain alcohol, particularly beer and hard liquor. Alcohol can affect the way your body gets rid of uric acid. Meal planning  Learn which foods do or do not affect you. If you find out that a food tends to cause your gout symptoms to flare up, avoid eating that food. You can enjoy foods that do not cause problems. If you have any questions about a food item, talk with your dietitian or health care provider. Reduce the overall amount of meat in your diet. When you do eat meat, choose ones with lower purine content. Include plenty of fruits and vegetables. Although some vegetables may have a high purine content--such as asparagus, mushrooms, spinach, or cauliflower--it has been shown that these do not contribute to uric acid blood levels as much. Consume at least 1 dairy serving a day. This has been shown to decrease uric acid levels. General information If you drink alcohol: Limit how much you have to: 0-1 drink a day for  women who are not pregnant. 0-2 drinks a day for men. Know how much alcohol is in a drink. In the [place] one drink equals one 12 oz bottle of beer (355 mL), one 5 oz glass of wine (148 mL), or one 1 oz glass of hard liquor (44 mL). Drink plenty of water. Try to drink enough to keep your urine pale yellow. Fluids can help remove uric acid from your body. Work with your health care provider and dietitian to develop a plan to achieve or maintain a healthy weight. Losing weight may help reduce uric acid in your blood. What foods are recommended? The following are some types of foods that are good choices when limiting purine intake: Fresh or frozen fruits and vegetables. Whole grains, breads, cereals, and pasta. Rice. Beans, peas, legumes. Nuts and seeds. Dairy products. Fats and oils. The items listed above may not be a complete list. Talk with a dietitian about what dietary choices are best for you. What foods are not recommended? Limit your intake of foods high in purines, including: Beer and other alcohol. Meat-based gravy or sauce. Canned or fresh fish, such as: Anchovies, sardines, herring, salmon, and tuna. Mussels and scallops. Codfish, trout, and haddock. Bacon, veal, chicken breast with skin, and lamb. Organ meats, such as: Liver or kidney. Tripe. Sweetbreads (thymus gland or pancreas). Wild game or goose. Yeast or yeast extract supplements. Drinks sweetened with high-fructose corn syrup, such as soda. Processed foods made with high-fructose corn syrup. The items listed above may not be a complete list of foods   and beverages you should limit. Contact a dietitian for more information. Summary Eating a low-purine diet may help control conditions caused by too much uric acid in the body, such as gout or kidney stones. Choose low-purine foods, limit alcohol, and limit high-fructose corn syrup. You will learn over time which foods do or do not affect you. If you find out that a  food tends to cause your gout symptoms to flare up, avoid eating that food. This information is not intended to replace advice given to you by your health care provider. Make sure you discuss any questions you have with your health care provider. Document Revised: 03/18/2021 Document Reviewed: 03/18/2021 Elsevier Patient Education  2023 Elsevier Inc.  

## 2021-09-20 NOTE — Assessment & Plan Note (Signed)
Has been in normal range for the past couple years Recent pertinent labs: Lab Results  Component Value Date   HGBA1C 5.3 11/17/2020   HGBA1C 5.4 05/21/2020   HGBA1C 5.4 05/02/2019

## 2021-09-20 NOTE — Progress Notes (Signed)
Name: Donald [name]   MRN: [id number]    DOB: 1980-07-08   Date:09/20/2021       Progress Note  Chief Complaint  Patient presents with   Follow-up   Gout    Pt states been flaring up lately       Subjective:   Donald [name] is a 41 y.o. male, presents to clinic for gout and FMLA paperwork Pt last seen by me 10 months ago for CPE, other OV for acute issues - abcesses/back pain   Gout - taking allopurinol was on 450 and he increased the dose on his own to 2 pills  Hx of poor compliance (on and off meds)  Lab Results  Component Value Date   LABURIC 7.5 11/17/2020  Flares frequent - found a drink that causes a gout flare - he now avoids it Having to miss work in the past 6 months about 6 times  Left 1st MTP is the joint that always flares up   GERD - taking protonix prn  S/p Weight loss surgery - on many supplemental vitamins Erythrocytosis, neutropenia unspecified, and folate deficiency he has previously consulted with hematology and was subsequently discharged He has continued supplements from bariatric clinic for the past 5+ years  History of hyperlipidemia and prediabetes -a lot of his past medical problems did improve after weight loss surgery Lab Results  Component Value Date   CHOL 212 (H) 11/17/2020   HDL 67 11/17/2020   LDLCALC 126 (H) 11/17/2020   TRIG 89 11/17/2020   CHOLHDL 3.2 11/17/2020  Cholesterol continues to be high not on statin Lab Results  Component Value Date   HGBA1C 5.3 11/17/2020  A1C has been normal for several years    Current Outpatient Medications:    allopurinol (ZYLOPRIM) 300 MG tablet, Take 300 mg tab po in the morning, take 150 mg (half tab) po in evening daily for gout, Disp: 45 tablet, Rfl: 11   Calcium 500 MG CHEW, Chew by mouth 3 (three) times daily., Disp: , Rfl:    chlorhexidine (PERIDEX) 0.12 % solution, 0.12 mLs by Mouth Rinse route 2 (two) times daily., Disp: 120 mL, Rfl: 6   indomethacin (INDOCIN) 25 MG capsule,  Take 50mg  PO TID x 2d, then 25 mg PO TID x 3 d, take PRN at onset of gout symptoms, Disp: 30 capsule, Rfl: 2   Multiple Vitamin (MULTIVITAMIN) tablet, Take 1 tablet by mouth daily., Disp: , Rfl:    pantoprazole (PROTONIX) 40 MG tablet, Take 1 tablet (40 mg total) by mouth daily., Disp: 30 tablet, Rfl: 11   folic acid (FOLVITE) 1 MG tablet, Take 1 tablet (1 mg total) by mouth daily. (Patient not taking: Reported on 09/20/2021), Disp: 30 tablet, Rfl: 2   tizanidine (ZANAFLEX) 2 MG capsule, Take 1 capsule (2 mg total) by mouth 3 (three) times daily. (Patient not taking: Reported on 09/20/2021), Disp: 30 capsule, Rfl: 0  Patient Active Problem List   Diagnosis Date Noted   Folate deficiency 12/02/2020   Neutropenia (HCC) 12/02/2020   Erythrocytosis 12/02/2020   Medication monitoring encounter 05/31/2016   Hypertriglyceridemia 03/21/2016   S/P laparoscopic sleeve gastrectomy 03/21/2016   Preventative health care 09/10/2015   Family hx of prostate cancer 09/10/2015   Situational anxiety 06/26/2015   Pre-diabetes 02/20/2015   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 tophus 02/20/2015   Hyperlipidemia LDL goal <100 01/27/2015    Past Surgical History:  Procedure Laterality Date   LAPAROSCOPIC GASTRIC SLEEVE RESECTION  N/A 03/21/2016   Procedure: LAPAROSCOPIC GASTRIC SLEEVE RESECTION, UPPER ENDO;  Surgeon: Gaynelle Adu, MD;  Location: [facility];  Service: General;  Laterality: N/A;   WISDOM TOOTH EXTRACTION      Family History  Problem Relation Age of Onset   Hyperlipidemia Mother    Diabetes Mother    Diabetes Father    Prostate cancer Father    Prostate cancer Paternal Grandfather     Social History   Tobacco Use   Smoking status: Former    Packs/day: 0.30    Types: Cigarettes    Start date: 2008    Quit date: 2018    Years since quitting: 5.4   Smokeless tobacco: Never   Tobacco comments:    smokes at social events rarely  Vaping Use   Vaping Use: Never used  Substance  Use Topics   Alcohol use: Yes    Alcohol/week: 0.0 - 1.0 standard drinks    Comment: socially   Drug use: No     No Known Allergies  Health Maintenance  Topic Date Due   COVID-19 Vaccine (4 - Booster for Pfizer series) 10/06/2021 (Originally 03/11/2020)   INFLUENZA VACCINE  11/16/2021   TETANUS/TDAP  08/05/2027   Hepatitis C Screening  Completed   HIV Screening  Completed   HPV VACCINES  Aged Out    Chart Review Today: I personally reviewed active problem list, medication list, allergies, family history, social history, health maintenance, notes from last encounter, lab results, imaging with the patient/caregiver today.   Review of Systems  Constitutional: Negative.   HENT: Negative.    Eyes: Negative.   Respiratory: Negative.    Cardiovascular: Negative.   Gastrointestinal: Negative.   Endocrine: Negative.   Genitourinary: Negative.   Musculoskeletal: Negative.   Skin: Negative.   Allergic/Immunologic: Negative.   Neurological: Negative.   Hematological: Negative.   Psychiatric/Behavioral: Negative.    All other systems reviewed and are negative.   Objective:   Vitals:   09/20/21 1000  BP: 118/68  Pulse: 81  Resp: 16  Temp: 98.5 F (36.9 C)  TempSrc: Oral  SpO2: 100%  Weight: 246 lb 14.4 oz (112 kg)  Height: 6\' 5"  (1.956 m)    Body mass index is 29.28 kg/m.  Physical Exam Vitals and nursing note reviewed.  Constitutional:      Appearance: Normal appearance. He is well-developed, well-groomed and overweight. He is not ill-appearing, toxic-appearing or diaphoretic.  HENT:     Head: Normocephalic and atraumatic.     Right Ear: External ear normal.     Left Ear: External ear normal.  Eyes:     General:        Right eye: No discharge.        Left eye: No discharge.     Conjunctiva/sclera: Conjunctivae normal.  Neck:     Trachea: No tracheal deviation.  Cardiovascular:     Rate and Rhythm: Normal rate and regular rhythm.     Pulses: Normal pulses.      Heart sounds: Normal heart sounds.  Pulmonary:     Effort: Pulmonary effort is normal. No respiratory distress.     Breath sounds: No stridor.  Skin:    General: Skin is warm and dry.     Findings: No rash.  Neurological:     Mental Status: He is alert.     Motor: No abnormal muscle tone.     Coordination: Coordination normal.  Psychiatric:        Mood and  Affect: Mood normal.        Behavior: Behavior normal. Behavior is cooperative.        Assessment & Plan:   Problem List Items Addressed This Visit       Digestive   Gastroesophageal reflux disease with esophagitis    Patient using Protonix as needed, encouraged diet/lifestyle efforts, can use Pepcid as needed and encouraged Protonix in 1-2 week treatments when sx are more severe - works more preventative - would avoid long term PPI to avoid any worsening deficiencies       Relevant Medications   pantoprazole (PROTONIX) 40 MG tablet     Other   Hyperlipidemia LDL goal <100 (Chronic)    Last lipids with CPE high, not on meds Risk is low, statins not indicated The 10-year ASCVD risk score (Arnett DK, et al., 2019) is: 2.5%   Values used to calculate the score:     Age: [age] years     Sex: Male     Is Non-Hispanic African American: Yes     Diabetic: No     Tobacco smoker: No     Systolic Blood Pressure: 118 mmHg     Is BP treated: No     HDL Cholesterol: 67 mg/dL     Total Cholesterol: 212 mg/dL        Relevant Orders   COMPLETE METABOLIC PANEL WITH GFR   Pre-diabetes (Chronic)    Has been in normal range for the past couple years Recent pertinent labs: Lab Results  Component Value Date   HGBA1C 5.3 11/17/2020   HGBA1C 5.4 05/21/2020   HGBA1C 5.4 05/02/2019          Relevant Orders   COMPLETE METABOLIC PANEL WITH GFR   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 tophus    Pt has increased allopurinol dose in his own after severe flare - which was likely caused by leaving his medications at  home while going on a cruise and with diet triggers as well He was previously on 450 mg allopurinol daily and his last uric acid level was still not at goal.  History of inconsistent med compliance on and off medications Recheck uric acid and adjust allopurinol dose as needed Purine foods and gout triggers were reviewed and printed on after visit summary       Folate deficiency    Continue recommended supplement       Relevant Orders   CBC with Differential/Platelet   Neutropenia (HCC)    Prior consult with hematology, likely patient's baseline, will continue to monitor       Relevant Orders   CBC with Differential/Platelet   Other Visit Diagnoses     Gout, unspecified cause, unspecified chronicity, unspecified site    -  Primary   Relevant Orders   COMPLETE METABOLIC PANEL WITH GFR   Uric acid   Encounter for completion of form with patient       FMLA forms completed today - spent over 10 additional minutes on paperwork    Overweight (BMI 25.0-29.9)       Weight stable   Encounter for medication monitoring       Relevant Orders   COMPLETE METABOLIC PANEL WITH GFR   CBC with Differential/Platelet   Uric acid        Return in about 6 months (around 03/22/2022) for Annual Physical, Routine follow-up.   Danelle BerryLeisa Toshiyuki Fredell, PA-C 09/20/21 10:14 AM

## 2021-09-20 NOTE — Assessment & Plan Note (Signed)
Last lipids with CPE high, not on meds Risk is low, statins not indicated The 10-year ASCVD risk score (Arnett DK, et al., 2019) is: 2.5%   Values used to calculate the score:     Age: 41 years     Sex: Male     Is Non-Hispanic African American: Yes     Diabetic: No     Tobacco smoker: No     Systolic Blood Pressure: 123456 mmHg     Is BP treated: No     HDL Cholesterol: 67 mg/dL     Total Cholesterol: 212 mg/dL

## 2021-09-20 NOTE — Assessment & Plan Note (Signed)
Pt has increased allopurinol dose in his own after severe flare - which was likely caused by leaving his medications at home while going on a cruise and with diet triggers as well He was previously on 450 mg allopurinol daily and his last uric acid level was still not at goal.  History of inconsistent med compliance on and off medications Recheck uric acid and adjust allopurinol dose as needed Purine foods and gout triggers were reviewed and printed on after visit summary

## 2021-09-20 NOTE — Assessment & Plan Note (Signed)
Patient using Protonix as needed, encouraged diet/lifestyle efforts, can use Pepcid as needed and encouraged Protonix in 1-2 week treatments when sx are more severe - works more preventative - would avoid long term PPI to avoid any worsening deficiencies

## 2021-09-21 ENCOUNTER — Other Ambulatory Visit: Payer: Self-pay | Admitting: Family Medicine

## 2021-09-21 DIAGNOSIS — M109 Gout, unspecified: Secondary | ICD-10-CM

## 2021-09-21 LAB — CBC WITH DIFFERENTIAL/PLATELET
Absolute Monocytes: 320 cells/uL (ref 200–950)
Basophils Absolute: 28 cells/uL (ref 0–200)
Basophils Relative: 0.7 %
Eosinophils Absolute: 60 cells/uL (ref 15–500)
Eosinophils Relative: 1.5 %
HCT: 50.1 % — ABNORMAL HIGH (ref 38.5–50.0)
Hemoglobin: 16.7 g/dL (ref 13.2–17.1)
Lymphs Abs: 1660 cells/uL (ref 850–3900)
MCH: 27.1 pg (ref 27.0–33.0)
MCHC: 33.3 g/dL (ref 32.0–36.0)
MCV: 81.3 fL (ref 80.0–100.0)
MPV: 11.5 fL (ref 7.5–12.5)
Monocytes Relative: 8 %
Neutro Abs: 1932 cells/uL (ref 1500–7800)
Neutrophils Relative %: 48.3 %
Platelets: 195 10*3/uL (ref 140–400)
RBC: 6.16 10*6/uL — ABNORMAL HIGH (ref 4.20–5.80)
RDW: 14.2 % (ref 11.0–15.0)
Total Lymphocyte: 41.5 %
WBC: 4 10*3/uL (ref 3.8–10.8)

## 2021-09-21 LAB — COMPLETE METABOLIC PANEL WITH GFR
AG Ratio: 1.4 (calc) (ref 1.0–2.5)
ALT: 17 U/L (ref 9–46)
AST: 17 U/L (ref 10–40)
Albumin: 4.3 g/dL (ref 3.6–5.1)
Alkaline phosphatase (APISO): 65 U/L (ref 36–130)
BUN: 17 mg/dL (ref 7–25)
CO2: 29 mmol/L (ref 20–32)
Calcium: 9.6 mg/dL (ref 8.6–10.3)
Chloride: 102 mmol/L (ref 98–110)
Creat: 1.29 mg/dL (ref 0.60–1.29)
Globulin: 3 g/dL (calc) (ref 1.9–3.7)
Glucose, Bld: 82 mg/dL (ref 65–99)
Potassium: 4.8 mmol/L (ref 3.5–5.3)
Sodium: 138 mmol/L (ref 135–146)
Total Bilirubin: 0.5 mg/dL (ref 0.2–1.2)
Total Protein: 7.3 g/dL (ref 6.1–8.1)
eGFR: 72 mL/min/{1.73_m2} (ref >=60)

## 2021-09-21 LAB — URIC ACID: Uric Acid, Serum: 7.6 mg/dL (ref 4.0–8.0)

## 2021-09-21 MED ORDER — ALLOPURINOL 300 MG PO TABS: 300.0000 mg | ORAL_TABLET | Freq: Two times a day (BID) | ORAL | 1 refills | Status: DC

## 2021-09-21 NOTE — Addendum Note (Signed)
Addended by: Delsa Grana on: 09/21/2021 03:16 PM   Modules accepted: Orders

## 2021-10-15 ENCOUNTER — Encounter (HOSPITAL_COMMUNITY): Payer: Self-pay | Admitting: *Deleted

## 2021-10-25 ENCOUNTER — Ambulatory Visit (HOSPITAL_COMMUNITY)
Admission: EM | Admit: 2021-10-25 | Discharge: 2021-10-25 | Disposition: A | Discharge status: Home / Self Care | Payer: 59 | Attending: Urgent Care | Admitting: Urgent Care

## 2021-10-25 ENCOUNTER — Encounter (HOSPITAL_COMMUNITY): Payer: Self-pay

## 2021-10-25 DIAGNOSIS — M545 Low back pain, unspecified: Secondary | ICD-10-CM

## 2021-10-25 MED ORDER — ETODOLAC 500 MG PO TABS: 500.0000 mg | ORAL_TABLET | Freq: Two times a day (BID) | ORAL | 0 refills | Status: AC

## 2021-10-25 MED ORDER — KETOROLAC TROMETHAMINE 60 MG/2ML IM SOLN
60.0000 mg | Freq: Once | INTRAMUSCULAR | Status: AC
  Administered 2021-10-25: 60 mg via INTRAMUSCULAR

## 2021-10-25 MED ORDER — METAXALONE 800 MG PO TABS
800.0000 mg | ORAL_TABLET | Freq: Three times a day (TID) | ORAL | 0 refills | Status: DC
Start: 2021-10-25 — End: 2022-05-30

## 2021-10-25 MED ORDER — KETOROLAC TROMETHAMINE 60 MG/2ML IM SOLN
INTRAMUSCULAR | Status: AC
  Filled 2021-10-25: qty 2

## 2021-10-25 NOTE — ED Provider Notes (Signed)
[facility]    CSN: [id number] Arrival date & time: 10/25/21  1126      History   Chief Complaint Chief Complaint  Patient presents with   Back Pain    HPI Hammond Obeirne III is a 41 y.o. male.   Pleasant 41 year old male presents today with an acute onset of low back pain starting yesterday.  Reports a significant history of lumbar issues since his early 56s.  Has had numerous x-rays in the past, states no bony pathology.  Denies any acute trauma or overuse.  Symptoms started yesterday.  Denies change in bowel or bladder, saddle anesthesia, radicular symptoms.  States his current symptoms feel similar and consistent with all his previous flares.  Has been following with chiropractor, but feels that their treatments are ineffective.  Works at the [employer], unable to lift heavy boxes at current time.   Back Pain   Past Medical History:  Diagnosis Date   Benign essential HTN    Family hx of prostate cancer 09/10/2015   History of sleeve gastrectomy 03/21/2016   Hyperlipidemia LDL goal <100    Hypertension    Pre-diabetes    Situational anxiety     Patient Active Problem List   Diagnosis Date Noted   Gastroesophageal reflux disease with esophagitis 09/20/2021   Folate deficiency 12/02/2020   Neutropenia (HCC) 12/02/2020   S/P laparoscopic sleeve gastrectomy 03/21/2016   Family hx of prostate cancer 09/10/2015   Pre-diabetes 02/20/2015   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 tophus 02/20/2015   Hyperlipidemia LDL goal <100 01/27/2015    Past Surgical History:  Procedure Laterality Date   LAPAROSCOPIC GASTRIC SLEEVE RESECTION N/A 03/21/2016   Procedure: LAPAROSCOPIC GASTRIC SLEEVE RESECTION, UPPER ENDO;  Surgeon: Gaynelle Adu, MD;  Location: [facility];  Service: General;  Laterality: N/A;   WISDOM TOOTH EXTRACTION         Home Medications    Prior to Admission medications   Medication Sig Start Date End Date Taking? Authorizing Provider   etodolac (LODINE) 500 MG tablet Take 1 tablet (500 mg total) by mouth 2 (two) times daily for 7 days. 10/25/21 11/01/21 Yes Darina Hartwell L, PA  metaxalone (SKELAXIN) 800 MG tablet Take 1 tablet (800 mg total) by mouth 3 (three) times daily. 10/25/21  Yes Takelia Urieta L, PA  allopurinol (ZYLOPRIM) 300 MG tablet Take 1 tablet (300 mg total) by mouth 2 (two) times daily. 09/21/21   Danelle Berry, PA-C  Multiple Vitamin (MULTIVITAMIN) tablet Take 1 tablet by mouth daily.    [provider]  pantoprazole (PROTONIX) 40 MG tablet Take 1 tablet (40 mg total) by mouth daily. 09/20/21   Danelle Berry, PA-C    Family History Family History  Problem Relation Age of Onset   Hyperlipidemia Mother    Diabetes Mother    Diabetes Father    Prostate cancer Father    Prostate cancer Paternal Grandfather     Social History Social History   Tobacco Use   Smoking status: Former    Packs/day: 0.30    Types: Cigarettes    Start date: 2008    Quit date: 2018    Years since quitting: 5.5   Smokeless tobacco: Never   Tobacco comments:    smokes at social events rarely  Vaping Use   Vaping Use: Never used  Substance Use Topics   Alcohol use: Yes    Alcohol/week: 0.0 - 1.0 standard drinks of alcohol    Comment: socially  Drug use: No     Allergies   Patient has no known allergies.   Review of Systems Review of Systems  Musculoskeletal:  Positive for back pain.     Physical Exam Triage Vital Signs ED Triage Vitals  Enc Vitals Group     BP 10/25/21 1220 107/68     Pulse Rate 10/25/21 1220 82     Resp 10/25/21 1220 18     Temp 10/25/21 1220 99.1 F (37.3 C)     Temp Source 10/25/21 1220 Oral     SpO2 10/25/21 1220 98 %     Weight --      Height --      Head Circumference --      Peak Flow --      Pain Score 10/25/21 1221 10     Pain Loc --      Pain Edu? --      Excl. in GC? --    No data found.  Updated Vital Signs BP 107/68 (BP Location: Left Arm)   Pulse 82   Temp  99.1 F (37.3 C) (Oral)   Resp 18   SpO2 98%   Visual Acuity Right Eye Distance:   Left Eye Distance:   Bilateral Distance:    Right Eye Near:   Left Eye Near:    Bilateral Near:     Physical Exam Vitals and nursing note reviewed. Exam conducted with a chaperone present.  Constitutional:      General: He is not in acute distress.    Appearance: Normal appearance. He is normal weight. He is not ill-appearing, toxic-appearing or diaphoretic.  HENT:     Head: Normocephalic and atraumatic.     Mouth/Throat:     Mouth: Mucous membranes are moist.  Cardiovascular:     Rate and Rhythm: Normal rate and regular rhythm.     Pulses: Normal pulses.  Pulmonary:     Effort: Pulmonary effort is normal. No respiratory distress.  Abdominal:     General: Abdomen is flat. Bowel sounds are normal. There is no distension.     Palpations: Abdomen is soft. There is no mass.     Tenderness: There is no abdominal tenderness. There is no right CVA tenderness, left CVA tenderness, guarding or rebound.     Hernia: No hernia is present.  Musculoskeletal:        General: Tenderness (reproducible pain to left lower lumbar muscular region) present. No swelling, deformity or signs of injury. Normal range of motion.     Cervical back: Normal.     Thoracic back: Normal.     Lumbar back: Tenderness present. No swelling, deformity, lacerations or spasms. Negative right straight leg raise test and negative left straight leg raise test.     Right lower leg: No edema.     Left lower leg: No edema.     Comments: No bony tenderness, no step off deformity  Skin:    General: Skin is warm.     Findings: No bruising, erythema or lesion.  Neurological:     General: No focal deficit present.     Mental Status: He is alert.     Sensory: No sensory deficit.     Motor: No weakness.     Gait: Gait normal.  Psychiatric:        Mood and Affect: Mood normal.      UC Treatments / Results  Labs (all labs ordered  are listed, but only abnormal results are displayed)  Labs Reviewed - No data to display  EKG   Radiology No results found.  Procedures Procedures (including critical care time)  Medications Ordered in UC Medications  ketorolac (TORADOL) injection 60 mg (60 mg Intramuscular Given 10/25/21 1302)    Initial Impression / Assessment and Plan / UC Course  I have reviewed the triage vital signs and the nursing notes.  Pertinent labs & imaging results that were available during my care of the patient were reviewed by me and considered in my medical decision making (see chart for details).     Acute bilateral (L>R) lower back pain - toradol given in office. Pt without red flag s/sx and is consistent with prior flares. Out of work to prevent excessive strain, moist heat to area, BID NSAID as needed, TID muscle relaxer as needed. Consider TENS unit and PT follow up. ER precautions discussed  Final Clinical Impressions(s) / UC Diagnoses   Final diagnoses:  Acute bilateral low back pain without sciatica     Discharge Instructions      Your symptoms are consistent with a lumbar strain.  Please take the muscle relaxer up to 3 times daily as needed.  Side effects include sedation. You were given an injection of Toradol in our office.  Please do not take any additional NSAIDs this evening. You may start with the prescribed anti-inflammatory tomorrow, twice daily with food. Please do not take your home indomethacin at this time. Use a moist warm compress to the affected area several times daily. Consider TENS unit - you can purchase these on [employer] Return to work in 3 days, sooner if improvement noted.      ED Prescriptions     Medication Sig Dispense Auth. Provider   etodolac (LODINE) 500 MG tablet Take 1 tablet (500 mg total) by mouth 2 (two) times daily for 7 days. 14 tablet Chirstina Haan L, PA   metaxalone (SKELAXIN) 800 MG tablet Take 1 tablet (800 mg total) by mouth 3 (three)  times daily. 21 tablet Jaxden Blyden L, [place]      PDMP not reviewed this encounter.   Maretta Bees, [place] 10/25/21 1305

## 2021-10-25 NOTE — Discharge Instructions (Signed)
Your symptoms are consistent with a lumbar strain.  Please take the muscle relaxer up to 3 times daily as needed.  Side effects include sedation. You were given an injection of Toradol in our office.  Please do not take any additional NSAIDs this evening. You may start with the prescribed anti-inflammatory tomorrow, twice daily with food. Please do not take your home indomethacin at this time. Use a moist warm compress to the affected area several times daily. Consider TENS unit - you can purchase these on [employer] Return to work in 3 days, sooner if improvement noted.

## 2021-10-25 NOTE — ED Triage Notes (Signed)
Pt c/o lt lower back pain since yesterday. States hx of same with no dx. Denies injury.

## 2021-11-04 ENCOUNTER — Encounter: Payer: Self-pay | Admitting: Family Medicine

## 2021-11-17 ENCOUNTER — Telehealth: Payer: 59 | Admitting: Family Medicine

## 2021-11-17 DIAGNOSIS — R42 Dizziness and giddiness: Secondary | ICD-10-CM

## 2021-11-17 MED ORDER — MECLIZINE HCL 25 MG PO TABS
25.0000 mg | ORAL_TABLET | Freq: Three times a day (TID) | ORAL | 0 refills | Status: DC | PRN
Start: 2021-11-17 — End: 2022-05-30

## 2021-11-17 MED ORDER — PREDNISONE 10 MG PO TABS: 10.0000 mg | ORAL_TABLET | Freq: Every day | ORAL | 0 refills | Status: DC

## 2021-11-17 NOTE — Patient Instructions (Signed)
Vertigo Vertigo is the feeling that you or your surroundings are moving when they are not. This feeling can come and go at any time. Vertigo often goes away on its own. Vertigo can be dangerous if it occurs while you are doing something that could endanger yourself or others, such as driving or operating machinery. Your health care provider will do tests to try to determine the cause of your vertigo. Tests will also help your health care provider decide how best to treat your condition. Follow these instructions at home: Eating and drinking     Dehydration can make vertigo worse. Drink enough fluid to keep your urine pale yellow. Do not drink alcohol. Activity Return to your normal activities as told by your health care provider. Ask your health care provider what activities are safe for you. In the morning, first sit up on the side of the bed. When you feel okay, stand slowly while you hold onto something until you know that your balance is fine. Move slowly. Avoid sudden body or head movements or certain positions, as told by your health care provider. If you have trouble walking or keeping your balance, try using a cane for stability. If you feel dizzy or unstable, sit down right away. Avoid doing any tasks that would cause danger to you or others if vertigo occurs. Avoid bending down if you feel dizzy. Place items in your home so that they are easy for you to reach without bending or leaning over. Do not drive or use machinery if you feel dizzy. General instructions Take over-the-counter and prescription medicines only as told by your health care provider. Keep all follow-up visits. This is important. Contact a health care provider if: Your medicines do not relieve your vertigo or they make it worse. Your condition gets worse or you develop new symptoms. You have a fever. You develop nausea or vomiting, or if nausea gets worse. Your family or friends notice any behavioral changes. You  have numbness or a prickling and tingling sensation in part of your body. Get help right away if you: Are always dizzy or you faint. Develop severe headaches. Develop a stiff neck. Develop sensitivity to light. Have difficulty moving or speaking. Have weakness in your hands, arms, or legs. Have changes in your hearing or vision. These symptoms may represent a serious problem that is an emergency. Do not wait to see if the symptoms will go away. Get medical help right away. Call your local emergency services (911 in the [place] Do not drive yourself to the hospital. Summary Vertigo is the feeling that you or your surroundings are moving when they are not. Your health care provider will do tests to try to determine the cause of your vertigo. Follow instructions for home care. You may be told to avoid certain tasks, positions, or movements. Contact a health care provider if your medicines do not relieve your symptoms, or if you have a fever, nausea, vomiting, or changes in behavior. Get help right away if you have severe headaches or difficulty speaking, or you develop hearing or vision problems. This information is not intended to replace advice given to you by your health care provider. Make sure you discuss any questions you have with your health care provider. Document Revised: 03/04/2020 Document Reviewed: 03/04/2020 Elsevier Patient Education  2023 Elsevier Inc[employer]  

## 2021-11-17 NOTE — Progress Notes (Signed)
Virtual Visit Consent   Donald [name], you are scheduled for a virtual visit with a [facility] provider today. Just as with appointments in the office, your consent must be obtained to participate. Your consent will be active for this visit and any virtual visit you may have with one of our providers in the next 365 days. If you have a MyChart account, a copy of this consent can be sent to you electronically.  As this is a virtual visit, video technology does not allow for your provider to perform a traditional examination. This may limit your provider's ability to fully assess your condition. If your provider identifies any concerns that need to be evaluated in person or the need to arrange testing (such as labs, EKG, etc.), we will make arrangements to do so. Although advances in technology are sophisticated, we cannot ensure that it will always work on either your end or our end. If the connection with a video visit is poor, the visit may have to be switched to a telephone visit. With either a video or telephone visit, we are not always able to ensure that we have a secure connection.  By engaging in this virtual visit, you consent to the provision of healthcare and authorize for your insurance to be billed (if applicable) for the services provided during this visit. Depending on your insurance coverage, you may receive a charge related to this service.  I need to obtain your verbal consent now. Are you willing to proceed with your visit today? 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 Georgana Curio, FNP  Date: 11/17/2021 8:25 AM  Virtual Visit via Video Note   I, Georgana Curio, connected with  Donald [name]  ([id number], 1980-11-14) on 11/17/21 at  8:15 AM EDT by a video-enabled telemedicine application and verified that I am speaking with the correct person using two identifiers.  Location: Patient: Virtual Visit Location Patient:  Home Provider: Virtual Visit Location Provider: Home Office   I discussed the limitations of evaluation and management by telemedicine and the availability of in person appointments. The patient expressed understanding and agreed to proceed.    History of Present Illness: Donald [name] is a 41 y.o. who identifies as a male who was assigned male at birth, and is being seen today for vertigo. He says his wife has it also and he has been using her meclizine. It started yesterday when he woke. When he stands up the room is spinning. No nausea. No chest pain, sob, blurred vision. He also requests a work note. BP normal he reports at 114/80.   HPI: HPI  Problems:  Patient Active Problem List   Diagnosis Date Noted   Gastroesophageal reflux disease with esophagitis 09/20/2021   Folate deficiency 12/02/2020   Neutropenia (HCC) 12/02/2020   S/P laparoscopic sleeve gastrectomy 03/21/2016   Family hx of prostate cancer 09/10/2015   Pre-diabetes 02/20/2015   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 tophus 02/20/2015   Hyperlipidemia LDL goal <100 01/27/2015    Allergies: No Known Allergies Medications:  Current Outpatient Medications:    meclizine (ANTIVERT) 25 MG tablet, Take 1 tablet (25 mg total) by mouth 3 (three) times daily as needed for dizziness., Disp: 30 tablet, Rfl: 0   predniSONE (DELTASONE) 10 MG tablet, Take 1 tablet (10 mg total) by mouth daily with breakfast. Take 10 mg dose pack- 6 days as directed., Disp: 1 tablet, Rfl: 0  allopurinol (ZYLOPRIM) 300 MG tablet, Take 1 tablet (300 mg total) by mouth 2 (two) times daily., Disp: 180 tablet, Rfl: 1   metaxalone (SKELAXIN) 800 MG tablet, Take 1 tablet (800 mg total) by mouth 3 (three) times daily., Disp: 21 tablet, Rfl: 0   Multiple Vitamin (MULTIVITAMIN) tablet, Take 1 tablet by mouth daily., Disp: , Rfl:    pantoprazole (PROTONIX) 40 MG tablet, Take 1 tablet (40 mg total) by mouth daily., Disp: 30 tablet, Rfl:  11  Observations/Objective: Patient is well-developed, well-nourished in no acute distress.  Resting comfortably  at home.  Head is normocephalic, atraumatic.  No labored breathing.  Speech is clear and coherent with logical content.  Patient is alert and oriented at baseline.    Assessment and Plan: 1. Vertigo  Medication use and side effects discussed, proceed to urgent care if sx persist or worsen.   Follow Up Instructions: I discussed the assessment and treatment plan with the patient. The patient was provided an opportunity to ask questions and all were answered. The patient agreed with the plan and demonstrated an understanding of the instructions.  A copy of instructions were sent to the patient via MyChart unless otherwise noted below.   Patient has requested to receive PHI (AVS, Work Notes, etc) pertaining to this video visit through e-mail as they are currently without active MyChart. They have voiced understand that email is not considered secure and their health information could be viewed by someone other than the patient.   The patient was advised to call back or seek an in-person evaluation if the symptoms worsen or if the condition fails to improve as anticipated.  Time:  I spent 10 minutes with the patient via telehealth technology discussing the above problems/concerns.    Georgana Curio, FNP

## 2021-11-22 ENCOUNTER — Encounter: Payer: 59 | Admitting: Family Medicine

## 2021-11-23 NOTE — Patient Instructions (Incomplete)
We will check PSA level today and with your family history and your urinary symptoms I would like you to establish with urology  Preventive Care 58-40 Years Old, Male Preventive care refers to lifestyle choices and visits with your health care provider that can promote health and wellness. Preventive care visits are also called wellness exams. What can I expect for my preventive care visit? Counseling During your preventive care visit, your health care provider may ask about your: Medical history, including: Past medical problems. Family medical history. Current health, including: Emotional well-being. Home life and relationship well-being. Sexual activity. Lifestyle, including: Alcohol, nicotine or tobacco, and drug use. Access to firearms. Diet, exercise, and sleep habits. Safety issues such as seatbelt and bike helmet use. Sunscreen use. Work and work Astronomer. Physical exam Your health care provider will check your: Height and weight. These may be used to calculate your BMI (body mass index). BMI is a measurement that tells if you are at a healthy weight. Waist circumference. This measures the distance around your waistline. This measurement also tells if you are at a healthy weight and may help predict your risk of certain diseases, such as type 2 diabetes and high blood pressure. Heart rate and blood pressure. Body temperature. Skin for abnormal spots. What immunizations do I need?  Vaccines are usually given at various ages, according to a schedule. Your health care provider will recommend vaccines for you based on your age, medical history, and lifestyle or other factors, such as travel or where you work. What tests do I need? Screening Your health care provider may recommend screening tests for certain conditions. This may include: Lipid and cholesterol levels. Diabetes screening. This is done by checking your blood sugar (glucose) after you have not eaten for a while  (fasting). Hepatitis B test. Hepatitis C test. HIV (human immunodeficiency virus) test. STI (sexually transmitted infection) testing, if you are at risk. Lung cancer screening. Prostate cancer screening. Colorectal cancer screening. Talk with your health care provider about your test results, treatment options, and if necessary, the need for more tests. Follow these instructions at home: Eating and drinking  Eat a diet that includes fresh fruits and vegetables, whole grains, lean protein, and low-fat dairy products. Take vitamin and mineral supplements as recommended by your health care provider. Do not drink alcohol if your health care provider tells you not to drink. If you drink alcohol: Limit how much you have to 0-2 drinks a day. Know how much alcohol is in your drink. In the [place] one drink equals one 12 oz bottle of beer (355 mL), one 5 oz glass of wine (148 mL), or one 1 oz glass of hard liquor (44 mL). Lifestyle Brush your teeth every morning and night with fluoride toothpaste. Floss one time each day. Exercise for at least 30 minutes 5 or more days each week. Do not use any products that contain nicotine or tobacco. These products include cigarettes, chewing tobacco, and vaping devices, such as e-cigarettes. If you need help quitting, ask your health care provider. Do not use drugs. If you are sexually active, practice safe sex. Use a condom or other form of protection to prevent STIs. Take aspirin only as told by your health care provider. Make sure that you understand how much to take and what form to take. Work with your health care provider to find out whether it is safe and beneficial for you to take aspirin daily. Find healthy ways to manage stress, such as: Meditation,  yoga, or listening to music. Journaling. Talking to a trusted person. Spending time with friends and family. Minimize exposure to UV radiation to reduce your risk of skin cancer. Safety Always wear  your seat belt while driving or riding in a vehicle. Do not drive: If you have been drinking alcohol. Do not ride with someone who has been drinking. When you are tired or distracted. While texting. If you have been using any mind-altering substances or drugs. Wear a helmet and other protective equipment during sports activities. If you have firearms in your house, make sure you follow all gun safety procedures. What's next? Go to your health care provider once a year for an annual wellness visit. Ask your health care provider how often you should have your eyes and teeth checked. Stay up to date on all vaccines. This information is not intended to replace advice given to you by your health care provider. Make sure you discuss any questions you have with your health care provider. Document Revised: 09/30/2020 Document Reviewed: 09/30/2020 Elsevier Patient Education  2023 [employer].

## 2021-11-25 ENCOUNTER — Ambulatory Visit (INDEPENDENT_AMBULATORY_CARE_PROVIDER_SITE_OTHER): Payer: 59 | Admitting: Family Medicine

## 2021-11-25 ENCOUNTER — Encounter: Payer: Self-pay | Admitting: Family Medicine

## 2021-11-25 VITALS — BP 128/82 | HR 79 | Temp 98.0°F | Resp 16 | Ht 77.0 in | Wt 249.5 lb

## 2021-11-25 DIAGNOSIS — Z8042 Family history of malignant neoplasm of prostate: Secondary | ICD-10-CM

## 2021-11-25 DIAGNOSIS — M1A09X0 Idiopathic chronic gout, multiple sites, without tophus (tophi): Secondary | ICD-10-CM

## 2021-11-25 DIAGNOSIS — R7303 Prediabetes: Secondary | ICD-10-CM | POA: Diagnosis not present

## 2021-11-25 DIAGNOSIS — Z0000 Encounter for general adult medical examination without abnormal findings: Secondary | ICD-10-CM | POA: Diagnosis not present

## 2021-11-25 DIAGNOSIS — R399 Unspecified symptoms and signs involving the genitourinary system: Secondary | ICD-10-CM

## 2021-11-25 DIAGNOSIS — E785 Hyperlipidemia, unspecified: Secondary | ICD-10-CM

## 2021-11-25 DIAGNOSIS — K2100 Gastro-esophageal reflux disease with esophagitis, without bleeding: Secondary | ICD-10-CM | POA: Diagnosis not present

## 2021-11-25 NOTE — Progress Notes (Signed)
Patient: Donald [name], Male    DOB: 10-17-1980, 41 y.o.   MRN: [id number] Delsa Grana, PA-C Visit Date: 11/25/2021  Today's Provider: Delsa Grana, PA-C   Chief Complaint  Patient presents with   Annual Exam   Subjective:   Annual physical exam:  Donald [name] is a 41 y.o. male who presents today for health maintenance and annual & complete physical exam.   Exercise/Activity:  active 5d x week more than 30 min  Diet/nutrition:   healthy, prior sleeve gastrectomy Sleep:  no concerns  SDOH Screenings   Alcohol Screen: Low Risk  (11/25/2021)   Alcohol Screen    Last Alcohol Screening Score (AUDIT): 0  Depression (PHQ2-9): Low Risk  (11/25/2021)   Depression (PHQ2-9)    PHQ-2 Score: 0  Financial Resource Strain: Low Risk  (11/25/2021)   Overall Financial Resource Strain (CARDIA)    Difficulty of Paying Living Expenses: Not hard at all  Food Insecurity: No Food Insecurity (11/25/2021)   Hunger Vital Sign    Worried About Running Out of Food in the Last Year: Never true    [place] in the Last Year: Never true  Housing: Low Risk  (11/25/2021)   Housing    Last Housing Risk Score: 0  Physical Activity: Sufficiently Active (11/25/2021)   Exercise Vital Sign    Days of Exercise per Week: 5 days    Minutes of Exercise per Session: 30 min  Social Connections: Socially Integrated (11/25/2021)   Social Connection and Isolation Panel [NHANES]    Frequency of Communication with Friends and Family: More than three times a week    Frequency of Social Gatherings with Friends and Family: More than three times a week    Attends Religious Services: More than 4 times per year    Active Member of [employer] or Organizations: Yes    Attends Archivist Meetings: More than 4 times per year    Marital Status: Married  Stress: No Stress Concern Present (11/25/2021)   [employer] [place] of Stress : Not at  all  Tobacco Use: Medium Risk (11/25/2021)   Patient History    Smoking Tobacco Use: Former    Smokeless Tobacco Use: Never    Passive Exposure: Not on file  Transportation Needs: No Transportation Needs (11/25/2021)   PRAPARE - Hydrologist (Medical): No    Lack of Transportation (Non-Medical): No    USPSTF grade A and B recommendations - reviewed and addressed today  Depression:  Phq 9 completed today by patient, was reviewed by me with patient in the room, score is  negative, pt feels mood is good    11/25/2021   10:14 AM 09/20/2021    9:58 AM 05/18/2021   10:29 AM  Depression screen PHQ 2/9  Decreased Interest 0 0 0  Down, Depressed, Hopeless 0 0 0  PHQ - 2 Score 0 0 0  Altered sleeping 0 0 0  Tired, decreased energy 0 0 0  Change in appetite 0 0 0  Feeling bad or failure about yourself  0 0 0  Trouble concentrating 0 0 0  Moving slowly or fidgety/restless 0 0 0  Suicidal thoughts 0 0 0  PHQ-9 Score 0 0 0  Difficult doing work/chores Not difficult at all Not difficult at all Not difficult at all    Hep C Screening: done previously   STD testing  and prevention (HIV/chl/gon/syphilis): none needed  Intimate partner violence:denies   Advanced Care Planning:  A voluntary discussion about advance care planning including the explanation and discussion of advance directives.  Discussed health care proxy and Living will, and the patient was able to identify a health care proxy as wife - Shvawn.  Patient does not have a living will at present time. If patient does have living will, I have requested they bring this to the clinic to be scanned in to their chart.  Health Maintenance  Topic Date Due   INFLUENZA VACCINE  11/16/2021   COVID-19 Vaccine (4 - Pfizer series) 12/11/2021 (Originally 03/11/2020)   TETANUS/TDAP  08/05/2027   Hepatitis C Screening  Completed   HIV Screening  Completed   HPV VACCINES  Aged Out   Skin cancer:   last skin survey  was.  Pt reports no hx of skin cancer, suspicious lesions/biopsies in the past.  Colorectal cancer:  colonoscopy is not due per age Pt denies change in bowels, blood in stool  Prostate cancer: father with prostate CA at age 29 and also paternal grandfather, will screen psa today  No results found for: "PSA"  Urinary Symptoms:   IPSS     Row Name 11/25/21 1030 11/25/21 1100       International Prostate Symptom Score   How often have you had the sensation of not emptying your bladder? Almost always Less than 1 in 5    How often have you had to urinate less than every two hours? Not at All Not at All    How often have you found you stopped and started again several times when you urinated? Almost always Less than 1 in 5 times    How often have you found it difficult to postpone urination? Not at All Not at All    How often have you had a weak urinary stream? Not at All Less than 1 in 5 times    How often have you had to strain to start urination? Almost always Less than 1 in 5 times    How many times did you typically get up at night to urinate? 2 Times 2 Times    Total IPSS Score 17 6      Quality of Life due to urinary symptoms   If you were to spend the rest of your life with your urinary condition just the way it is now how would you feel about that? Mostly Satisfied Mostly Satisfied           Most sx first thing in the morning      Lung cancer:   Low Dose CT Chest recommended if Age 64-80 years, 20 pack-year currently smoking OR have quit w/in 15years. Patient does not qualify.   Social History   Tobacco Use   Smoking status: Former    Packs/day: 0.30    Types: Cigarettes    Start date: 2008    Quit date: 2018    Years since quitting: 5.6   Smokeless tobacco: Never   Tobacco comments:    smokes at social events rarely  Substance Use Topics   Alcohol use: Yes    Alcohol/week: 0.0 - 1.0 standard drinks of alcohol    Comment: socially     Alcohol  screening: [place] [facility] Visit from 11/25/2021 in [facility]  AUDIT-C Score 1       AAA: not indicated The USPSTF recommends one-time screening with ultrasonography in men ages  65 to 75 years who have ever smoked  RFV:OHKG needed today  Blood pressure/Hypertension: BP Readings from Last 3 Encounters:  11/25/21 128/82  10/25/21 107/68  09/20/21 118/68   Weight/Obesity: Wt Readings from Last 3 Encounters:  11/25/21 249 lb 8 oz (113.2 kg)  09/20/21 246 lb 14.4 oz (112 kg)  05/18/21 242 lb (109.8 kg)   BMI Readings from Last 3 Encounters:  11/25/21 29.59 kg/m  09/20/21 29.28 kg/m  05/18/21 29.46 kg/m    Lipids:  Lab Results  Component Value Date   CHOL 212 (H) 11/17/2020   CHOL 182 05/21/2020   CHOL 181 11/19/2019   Lab Results  Component Value Date   HDL 67 11/17/2020   HDL 66 05/21/2020   HDL 73 11/19/2019   Lab Results  Component Value Date   LDLCALC 126 (H) 11/17/2020   LDLCALC 102 (H) 05/21/2020   LDLCALC 92 11/19/2019   Lab Results  Component Value Date   TRIG 89 11/17/2020   TRIG 55 05/21/2020   TRIG 87 11/19/2019   Lab Results  Component Value Date   CHOLHDL 3.2 11/17/2020   CHOLHDL 2.8 05/21/2020   CHOLHDL 2.5 11/19/2019   No results found for: "LDLDIRECT" Based on the results of lipid panel his/her cardiovascular risk factor ( using [place] )  in the next 10 years is : The 10-year ASCVD risk score (Arnett DK, et al., 2019) is: 2.9%   Values used to calculate the score:     Age: 73 years     Sex: Male     Is Non-Hispanic African American: Yes     Diabetic: No     Tobacco smoker: No     Systolic Blood Pressure: 677 mmHg     Is BP treated: No     HDL Cholesterol: 67 mg/dL     Total Cholesterol: 212 mg/dL Glucose:  Glucose, Bld  Date Value Ref Range Status  09/20/2021 82 65 - 99 mg/dL Final    Comment:    .            Fasting reference interval .   11/17/2020 76 65 - 99 mg/dL Final    Comment:     .            Fasting reference interval .   05/21/2020 76 65 - 99 mg/dL Final    Comment:    .            Fasting reference interval .    Glucose-Capillary  Date Value Ref Range Status  03/21/2016 159 (H) 65 - 99 mg/dL Final    Social History       Social History   Socioeconomic History   Marital status: Married    Spouse name: Quarry manager   Number of children: 2   Years of education: Not on file   Highest education level: Some college, no degree  Occupational History   Not on file  Tobacco Use   Smoking status: Former    Packs/day: 0.30    Types: Cigarettes    Start date: 2008    Quit date: 2018    Years since quitting: 5.6   Smokeless tobacco: Never   Tobacco comments:    smokes at social events rarely  Vaping Use   Vaping Use: Never used  Substance and Sexual Activity   Alcohol use: Yes    Alcohol/week: 0.0 - 1.0 standard drinks of alcohol    Comment: socially   Drug use: No  Sexual activity: Yes    Partners: Female  Other Topics Concern   Not on file  Social History Narrative   Not on file   Social Determinants of Health   Financial Resource Strain: Low Risk  (11/25/2021)   Overall Financial Resource Strain (CARDIA)    Difficulty of Paying Living Expenses: Not hard at all  Food Insecurity: No Food Insecurity (11/25/2021)   Hunger Vital Sign    Worried About Running Out of Food in the Last Year: Never true    Ran Out of Food in the Last Year: Never true  Transportation Needs: No Transportation Needs (11/25/2021)   PRAPARE - Hydrologist (Medical): No    Lack of Transportation (Non-Medical): No  Physical Activity: Sufficiently Active (11/25/2021)   Exercise Vital Sign    Days of Exercise per Week: 5 days    Minutes of Exercise per Session: 30 min  Stress: No Stress Concern Present (11/25/2021)   [place]    Feeling of Stress : Not at all  Social  Connections: [place] (11/25/2021)   Social Connection and Isolation Panel [NHANES]    Frequency of Communication with Friends and Family: More than three times a week    Frequency of Social Gatherings with Friends and Family: More than three times a week    Attends Religious Services: More than 4 times per year    Active Member of [employer] or Organizations: Yes    Attends Music therapist: More than 4 times per year    Marital Status: Married    Family History        Family History  Problem Relation Age of Onset   Hyperlipidemia Mother    Diabetes Mother    Diabetes Father    Prostate cancer Father    Prostate cancer Paternal Grandfather     Patient Active Problem List   Diagnosis Date Noted   Gastroesophageal reflux disease with esophagitis 09/20/2021   Folate deficiency 12/02/2020   Neutropenia ([place]) 12/02/2020   S/P laparoscopic sleeve gastrectomy 03/21/2016   Family hx of prostate cancer 09/10/2015   Pre-diabetes 02/20/2015   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 tophus 02/20/2015   Hyperlipidemia LDL goal <100 01/27/2015    Past Surgical History:  Procedure Laterality Date   LAPAROSCOPIC GASTRIC SLEEVE RESECTION N/A 03/21/2016   Procedure: LAPAROSCOPIC GASTRIC SLEEVE RESECTION, UPPER ENDO;  Surgeon: Greer Pickerel, MD;  Location: [facility];  Service: General;  Laterality: N/A;   WISDOM TOOTH EXTRACTION       Current Outpatient Medications:    allopurinol (ZYLOPRIM) 300 MG tablet, Take 1 tablet (300 mg total) by mouth 2 (two) times daily., Disp: 180 tablet, Rfl: 1   meclizine (ANTIVERT) 25 MG tablet, Take 1 tablet (25 mg total) by mouth 3 (three) times daily as needed for dizziness., Disp: 30 tablet, Rfl: 0   metaxalone (SKELAXIN) 800 MG tablet, Take 1 tablet (800 mg total) by mouth 3 (three) times daily., Disp: 21 tablet, Rfl: 0   Multiple Vitamin (MULTIVITAMIN) tablet, Take 1 tablet by mouth daily., Disp: , Rfl:    pantoprazole (PROTONIX)  40 MG tablet, Take 1 tablet (40 mg total) by mouth daily., Disp: 30 tablet, Rfl: 11   predniSONE (DELTASONE) 10 MG tablet, Take 1 tablet (10 mg total) by mouth daily with breakfast. Take 10 mg dose pack- 6 days as directed., Disp: 1 tablet, Rfl: 0  No Known Allergies  Patient Care Team: Delsa Grana, PA-C as PCP - General (Family Medicine) Greer Pickerel, MD as Consulting Physician (General Surgery)   Chart Review: I personally reviewed active problem list, medication list, allergies, family history, social history, health maintenance, notes from last encounter, lab results, imaging with the patient/caregiver today.   Review of Systems  Constitutional: Negative.   HENT: Negative.    Eyes: Negative.   Respiratory: Negative.    Cardiovascular: Negative.   Gastrointestinal: Negative.   Endocrine: Negative.   Genitourinary: Negative.   Musculoskeletal: Negative.   Skin: Negative.   Allergic/Immunologic: Negative.   Neurological: Negative.   Hematological: Negative.   Psychiatric/Behavioral: Negative.    All other systems reviewed and are negative.         Objective:   Vitals:  Vitals:   11/25/21 1016  BP: 128/82  Pulse: 79  Resp: 16  Temp: 98 F (36.7 C)  TempSrc: Oral  SpO2: 96%  Weight: 249 lb 8 oz (113.2 kg)  Height: _0  (1.956 m)    Body mass index is 29.59 kg/m.  Physical Exam Vitals and nursing note reviewed.  Constitutional:      General: He is not in acute distress.    Appearance: Normal appearance. He is well-developed, well-groomed and overweight. He is not ill-appearing, toxic-appearing or diaphoretic.  HENT:     Head: Normocephalic and atraumatic.     Jaw: No trismus.     Right Ear: Tympanic membrane, ear canal and external ear normal. There is no impacted cerumen.     Left Ear: Tympanic membrane, ear canal and external ear normal. There is no impacted cerumen.     Nose: Nose normal. No mucosal edema, congestion or rhinorrhea.     Right Sinus: No  maxillary sinus tenderness or frontal sinus tenderness.     Left Sinus: No maxillary sinus tenderness or frontal sinus tenderness.     Mouth/Throat:     Mouth: Mucous membranes are moist.     Pharynx: Oropharynx is clear. Uvula midline. No oropharyngeal exudate, posterior oropharyngeal erythema or uvula swelling.  Eyes:     General: Lids are normal. No scleral icterus.       Right eye: No discharge.        Left eye: No discharge.     Conjunctiva/sclera: Conjunctivae normal.  Neck:     Trachea: Trachea and phonation normal. No tracheal deviation.  Cardiovascular:     Rate and Rhythm: Normal rate and regular rhythm.     Pulses: Normal pulses.          Radial pulses are 2+ on the right side and 2+ on the left side.       Posterior tibial pulses are 2+ on the right side and 2+ on the left side.     Heart sounds: Normal heart sounds. No murmur heard.    No friction rub. No gallop.  Pulmonary:     Effort: Pulmonary effort is normal. No respiratory distress.     Breath sounds: Normal breath sounds. No stridor. No wheezing, rhonchi or rales.  Abdominal:     General: Bowel sounds are normal. There is no distension.     Palpations: Abdomen is soft.     Tenderness: There is no abdominal tenderness. There is no guarding or rebound.  Musculoskeletal:     Cervical back: Normal range of motion and neck supple.     Right lower leg: No edema.     Left lower leg: No edema.  Lymphadenopathy:  Cervical: No cervical adenopathy.  Skin:    General: Skin is warm and dry.     Capillary Refill: Capillary refill takes less than 2 seconds.     Findings: No rash.  Neurological:     Mental Status: He is alert and oriented to person, place, and time.     Gait: Gait normal.  Psychiatric:        Speech: Speech normal.        Behavior: Behavior normal. Behavior is cooperative.      Recent Results (from the past 2160 hour(s))  COMPLETE METABOLIC PANEL WITH GFR     Status: None   Collection Time:  09/20/21 10:38 AM  Result Value Ref Range   Glucose, Bld 82 65 - 99 mg/dL    Comment: .            Fasting reference interval .    BUN 17 7 - 25 mg/dL   Creat 1.29 0.60 - 1.29 mg/dL   eGFR 72 > OR = 60 mL/min/1.23m    Comment: The eGFR is based on the CKD-EPI 2021 equation. To calculate  the new eGFR from a previous Creatinine or Cystatin C result, go to https://www.kidney.org/professionals/ kdoqi/gfr%5Fcalculator    BUN/Creatinine Ratio NOT APPLICABLE 6 - 22 (calc)   Sodium 138 135 - 146 mmol/L   Potassium 4.8 3.5 - 5.3 mmol/L   Chloride 102 98 - 110 mmol/L   CO2 29 20 - 32 mmol/L   Calcium 9.6 8.6 - 10.3 mg/dL   Total Protein 7.3 6.1 - 8.1 g/dL   Albumin 4.3 3.6 - 5.1 g/dL   Globulin 3.0 1.9 - 3.7 g/dL (calc)   AG Ratio 1.4 1.0 - 2.5 (calc)   Total Bilirubin 0.5 0.2 - 1.2 mg/dL   Alkaline phosphatase (APISO) 65 36 - 130 U/L   AST 17 10 - 40 U/L   ALT 17 9 - 46 U/L  CBC with Differential/Platelet     Status: Abnormal   Collection Time: 09/20/21 10:38 AM  Result Value Ref Range   WBC 4.0 3.8 - 10.8 Thousand/uL   RBC 6.16 (H) 4.20 - 5.80 Million/uL   Hemoglobin 16.7 13.2 - 17.1 g/dL   HCT 50.1 (H) 38.5 - 50.0 %   MCV 81.3 80.0 - 100.0 fL   MCH 27.1 27.0 - 33.0 pg   MCHC 33.3 32.0 - 36.0 g/dL   RDW 14.2 11.0 - 15.0 %   Platelets 195 140 - 400 Thousand/uL   MPV 11.5 7.5 - 12.5 fL   Neutro Abs 1,932 1,500 - 7,800 cells/uL   Lymphs Abs 1,660 850 - 3,900 cells/uL   Absolute Monocytes 320 200 - 950 cells/uL   Eosinophils Absolute 60 15 - 500 cells/uL   Basophils Absolute 28 0 - 200 cells/uL   Neutrophils Relative % 48.3 %   Total Lymphocyte 41.5 %   Monocytes Relative 8.0 %   Eosinophils Relative 1.5 %   Basophils Relative 0.7 %  Uric acid     Status: None   Collection Time: 09/20/21 10:38 AM  Result Value Ref Range   Uric Acid, Serum 7.6 4.0 - 8.0 mg/dL    Comment: Therapeutic target for gout patients: <6.0 mg/dL .     Fall Risk:    11/25/2021   10:14 AM  09/20/2021    9:58 AM 05/18/2021   10:29 AM 04/08/2021    1:07 PM 11/17/2020    9:37 AM  Fall Risk   Falls in the past year?  0 0 0 0 0  Number falls in past yr: 0 0 0 0 0  Injury with Fall? 0 0 0 0 0  Risk for fall due to : No Fall Risks No Fall Risks     Follow up Falls prevention discussed;Education provided Falls prevention discussed;Education provided  Falls evaluation completed     Functional Status Survey: Is the patient deaf or have difficulty hearing?: No Does the patient have difficulty seeing, even when wearing glasses/contacts?: No Does the patient have difficulty concentrating, remembering, or making decisions?: No Does the patient have difficulty walking or climbing stairs?: No Does the patient have difficulty dressing or bathing?: No Does the patient have difficulty doing errands alone such as visiting a doctor's office or shopping?: No   Assessment & Plan:    CPE completed today  Prostate cancer screening and PSA options (with potential risks and benefits of testing vs not testing) were discussed along with recent recs/guidelines, shared decision making and handout/information given to pt today  USPSTF grade A and B recommendations reviewed with patient; age-appropriate recommendations, preventive care, screening tests, etc discussed and encouraged; healthy living encouraged; see AVS for patient education given to patient  Discussed importance of 150 minutes of physical activity weekly, AHA exercise recommendations given to pt in AVS/handout  Discussed importance of healthy diet:  eating lean meats and proteins, avoiding trans fats and saturated fats, avoid simple sugars and excessive carbs in diet, eat 6 servings of fruit/vegetables daily and drink plenty of water and avoid sweet beverages.  DASH diet reviewed if pt has HTN  Recommended pt to do annual eye exam and routine dental exams/cleanings  Advance Care planning information and packet discussed and offered today,  encouraged pt to discuss with family members/spouse/partner/friends and complete Advanced directive packet and bring copy to office   Reviewed Health Maintenance: Health Maintenance  Topic Date Due   INFLUENZA VACCINE  11/16/2021   COVID-19 Vaccine (4 - Pfizer series) 12/11/2021 (Originally 03/11/2020)   TETANUS/TDAP  08/05/2027   Hepatitis C Screening  Completed   HIV Screening  Completed   HPV VACCINES  Aged Out    Immunizations: Immunization History  Administered Date(s) Administered   Influenza,inj,Quad PF,6+ Mos 02/20/2015, 02/01/2016, 02/27/2017, 02/09/2019   Influenza-Unspecified 01/15/2020   PFIZER Comirnaty([place])Covid-19 Tri-Sucrose Vaccine 06/26/2019, 07/16/2019, 01/15/2020   Tdap 10/26/2016, 08/04/2017   Vaccines:  HPV: up to at age 69 , ask insurance if age between 19-45  Shingrix: 38-64 yo and ask insurance if covered when patient above 62 yo Pneumonia:  educated and discussed with patient. Flu:  educated and discussed with patient. COVID:       ICD-10-CM   1. Annual physical exam  Z00.00 Hemoglobin A1c    Lipid panel    PSA    Uric acid    CBC with Differential/Platelet    COMPLETE METABOLIC PANEL WITH GFR    2. Gastroesophageal reflux disease with esophagitis, unspecified whether hemorrhage  K21.00    sx well controlled using PPI PRN    3. Hyperlipidemia LDL goal <100  E78.5 Lipid panel    COMPLETE METABOLIC PANEL WITH GFR   LDL high last year, recheck today, ASCVD risk low, not currently on meds    4. Pre-diabetes  R73.03 Hemoglobin A1c    COMPLETE METABOLIC PANEL WITH GFR   recheck    5. Family hx of prostate cancer  Z80.42 PSA    Ambulatory referral to Urology   multiple male family members young ages ~33,  LUTS today, screen PSA and f/up with urology    6. 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 tophus  [id number] Uric acid   monitoring uric acid levels, improved med compliance and diet efforts, no flares since last appt, las Uric  acid elevated, recheck    7. Lower urinary tract symptoms (LUTS)  R39.9 Ambulatory referral to Urology   redid IPSS screening and score lower 7 - only mild sx upon waking, though increased nocturia - he wants urology consult with strong fam hx          Delsa Grana, PA-C 11/25/21 10:35 AM  [facility]

## 2021-11-26 LAB — COMPLETE METABOLIC PANEL WITH GFR
AG Ratio: 1.7 (calc) (ref 1.0–2.5)
ALT: 15 U/L (ref 9–46)
AST: 13 U/L (ref 10–40)
Albumin: 4.5 g/dL (ref 3.6–5.1)
Alkaline phosphatase (APISO): 59 U/L (ref 36–130)
BUN: 14 mg/dL (ref 7–25)
CO2: 29 mmol/L (ref 20–32)
Calcium: 9.9 mg/dL (ref 8.6–10.3)
Chloride: 102 mmol/L (ref 98–110)
Creat: 1.22 mg/dL (ref 0.60–1.29)
Globulin: 2.6 g/dL (calc) (ref 1.9–3.7)
Glucose, Bld: 90 mg/dL (ref 65–99)
Potassium: 4.9 mmol/L (ref 3.5–5.3)
Sodium: 140 mmol/L (ref 135–146)
Total Bilirubin: 0.4 mg/dL (ref 0.2–1.2)
Total Protein: 7.1 g/dL (ref 6.1–8.1)
eGFR: 77 mL/min/{1.73_m2} (ref >=60)

## 2021-11-26 LAB — URIC ACID: Uric Acid, Serum: 6.6 mg/dL (ref 4.0–8.0)

## 2021-11-26 LAB — CBC WITH DIFFERENTIAL/PLATELET
Absolute Monocytes: 351 cells/uL (ref 200–950)
Basophils Absolute: 31 cells/uL (ref 0–200)
Basophils Relative: 0.8 %
Eosinophils Absolute: 39 cells/uL (ref 15–500)
Eosinophils Relative: 1 %
HCT: 49.7 % (ref 38.5–50.0)
Hemoglobin: 16.8 g/dL (ref 13.2–17.1)
Lymphs Abs: 1603 cells/uL (ref 850–3900)
MCH: 27.8 pg (ref 27.0–33.0)
MCHC: 33.8 g/dL (ref 32.0–36.0)
MCV: 82.1 fL (ref 80.0–100.0)
MPV: 11.2 fL (ref 7.5–12.5)
Monocytes Relative: 9 %
Neutro Abs: 1876 cells/uL (ref 1500–7800)
Neutrophils Relative %: 48.1 %
Platelets: 213 10*3/uL (ref 140–400)
RBC: 6.05 10*6/uL — ABNORMAL HIGH (ref 4.20–5.80)
RDW: 14.4 % (ref 11.0–15.0)
Total Lymphocyte: 41.1 %
WBC: 3.9 10*3/uL (ref 3.8–10.8)

## 2021-11-26 LAB — LIPID PANEL
Cholesterol: 206 mg/dL — ABNORMAL HIGH (ref <200)
HDL: 69 mg/dL (ref >=40)
LDL Cholesterol (Calc): 113 mg/dL (calc) — ABNORMAL HIGH
Non-HDL Cholesterol (Calc): 137 mg/dL (calc) — ABNORMAL HIGH (ref <130)
Total CHOL/HDL Ratio: 3 (calc) (ref <5.0)
Triglycerides: 128 mg/dL (ref <150)

## 2021-11-26 LAB — HEMOGLOBIN A1C
Hgb A1c MFr Bld: 5.5 % of total Hgb (ref <5.7)
Mean Plasma Glucose: 111 mg/dL
eAG (mmol/L): 6.2 mmol/L

## 2021-11-26 LAB — PSA: PSA: 0.42 ng/mL (ref <=4.00)

## 2021-12-06 ENCOUNTER — Encounter: Payer: Self-pay | Admitting: *Deleted

## 2022-05-30 ENCOUNTER — Encounter: Payer: Self-pay | Admitting: Family Medicine

## 2022-05-30 ENCOUNTER — Ambulatory Visit (INDEPENDENT_AMBULATORY_CARE_PROVIDER_SITE_OTHER): Payer: Commercial Managed Care - PPO | Admitting: Family Medicine

## 2022-05-30 VITALS — BP 134/82 | HR 77 | Temp 98.1°F | Resp 16 | Ht 77.0 in | Wt 261.1 lb

## 2022-05-30 DIAGNOSIS — M109 Gout, unspecified: Secondary | ICD-10-CM

## 2022-05-30 DIAGNOSIS — R7303 Prediabetes: Secondary | ICD-10-CM | POA: Diagnosis not present

## 2022-05-30 DIAGNOSIS — E669 Obesity, unspecified: Secondary | ICD-10-CM | POA: Diagnosis not present

## 2022-05-30 DIAGNOSIS — E785 Hyperlipidemia, unspecified: Secondary | ICD-10-CM

## 2022-05-30 DIAGNOSIS — K2100 Gastro-esophageal reflux disease with esophagitis, without bleeding: Secondary | ICD-10-CM

## 2022-05-30 DIAGNOSIS — Z6830 Body mass index (BMI) 30.0-30.9, adult: Secondary | ICD-10-CM

## 2022-05-30 DIAGNOSIS — M1A09X0 Idiopathic chronic gout, multiple sites, without tophus (tophi): Secondary | ICD-10-CM

## 2022-05-30 DIAGNOSIS — M545 Low back pain, unspecified: Secondary | ICD-10-CM

## 2022-05-30 MED ORDER — ALLOPURINOL 300 MG PO TABS: 300.0000 mg | ORAL_TABLET | Freq: Two times a day (BID) | ORAL | 3 refills | Status: DC

## 2022-05-30 MED ORDER — PANTOPRAZOLE SODIUM 40 MG PO TBEC: 40.0000 mg | DELAYED_RELEASE_TABLET | Freq: Every day | ORAL | 11 refills | Status: DC

## 2022-05-30 NOTE — Assessment & Plan Note (Signed)
Managed with diet/lifestyle Lab Results  Component Value Date   HGBA1C 5.5 11/25/2021  Last A1C in normal range Will monitor A1C with CPE unless weight/diet/lifestyle changes or concerns

## 2022-05-30 NOTE — Assessment & Plan Note (Signed)
Uric acid improved from 9.1 to 6.6, only having flares when he drinks liquor On allopurinol 300 mg BID, reports good compliance

## 2022-05-30 NOTE — Assessment & Plan Note (Signed)
More than 2 months of sx He is going to [facility] for management On mobic and muscle relaxers and starting PT

## 2022-05-30 NOTE — Assessment & Plan Note (Signed)
Sx controlled, he uses PPI Rx intermittently, not using very much in the last 6 months

## 2022-05-30 NOTE — Progress Notes (Signed)
Name: Donald [name]   MRN: [id number]    DOB: 1980-07-08   Date:05/30/2022       Progress Note  Chief Complaint  Patient presents with   Follow-up   Hyperlipidemia   Gout   Gastroesophageal Reflux     Subjective:   Donald [name] is a 42 y.o. male, presents to clinic for routine f/up HLD/gout/GERD  GERD on pantoprazole- not using regularly, hx of sleeve gastrectomy No concerning abd sx, denies reflux, abd pain, indigestion  Gout:  triggered by alcohol/liquor  On alloupurinol 300 mg BID - prior flares with food choices and running out of med/forgeting meds Last labs improving, he reports in the last 6 months still only gout flares when he drinks alcohol Lab Results  Component Value Date   LABURIC 6.6 11/25/2021    Hyperlipidemia: Not on meds Last Lipids: Lab Results  Component Value Date   CHOL 206 (H) 11/25/2021   HDL 69 11/25/2021   LDLCALC 113 (H) 11/25/2021   TRIG 128 11/25/2021   CHOLHDL 3.0 11/25/2021  The 10-year ASCVD risk score (Arnett DK, et al., 2019) is: 3.3%   Values used to calculate the score:     Age: 41 years     Sex: Male     Is Non-Hispanic African American: Yes     Diabetic: No     Tobacco smoker: No     Systolic Blood Pressure: [id number] mmHg     Is BP treated: No     HDL Cholesterol: 69 mg/dL     Total Cholesterol: 206 mg/dL  - Denies: Chest pain, shortness of breath, myalgias, claudication  Hx of prediabetes, last labs A1C improved to 5.5  Back pain x 2 months - he is going to [facility] - got mobic and muscle relaxers, he is starting PT next week   Current Outpatient Medications:    chlorhexidine (PERIDEX) 0.12 % solution, SMARTSIG:0.5 Ounce(s) By Mouth Twice Daily, Disp: , Rfl:    meloxicam (MOBIC) 15 MG tablet, Take by mouth., Disp: , Rfl:    methocarbamol (ROBAXIN) 500 MG tablet, Take 500 mg by mouth at bedtime as needed., Disp: , Rfl:    Multiple Vitamin (MULTIVITAMIN) tablet, Take 1 tablet by mouth daily., Disp: , Rfl:     allopurinol (ZYLOPRIM) 300 MG tablet, Take 1 tablet (300 mg total) by mouth 2 (two) times daily., Disp: 180 tablet, Rfl: 3   pantoprazole (PROTONIX) 40 MG tablet, Take 1 tablet (40 mg total) by mouth daily., Disp: 30 tablet, Rfl: 11  Patient Active Problem List   Diagnosis Date Noted   Gastroesophageal reflux disease with esophagitis 09/20/2021   Folate deficiency 12/02/2020   Neutropenia ([place]) 12/02/2020   S/P laparoscopic sleeve gastrectomy 03/21/2016   Family hx of prostate cancer 09/10/2015   Pre-diabetes 02/20/2015   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 tophus 02/20/2015   Hyperlipidemia LDL goal <100 01/27/2015    Past Surgical History:  Procedure Laterality Date   LAPAROSCOPIC GASTRIC SLEEVE RESECTION N/A 03/21/2016   Procedure: LAPAROSCOPIC GASTRIC SLEEVE RESECTION, UPPER ENDO;  Surgeon: Greer Pickerel, MD;  Location: [facility];  Service: General;  Laterality: N/A;   WISDOM TOOTH EXTRACTION      Family History  Problem Relation Age of Onset   Hyperlipidemia Mother    Diabetes Mother    Diabetes Father    Prostate cancer Father    Prostate cancer Paternal Grandfather     Social History   Tobacco Use   Smoking status:  Former    Packs/day: 0.30    Types: Cigarettes    Start date: 2008    Quit date: 2018    Years since quitting: 6.1   Smokeless tobacco: Never   Tobacco comments:    smokes at social events rarely  Vaping Use   Vaping Use: Never used  Substance Use Topics   Alcohol use: Yes    Alcohol/week: 0.0 - 1.0 standard drinks of alcohol    Comment: socially   Drug use: No     No Known Allergies  Health Maintenance  Topic Date Due   INFLUENZA VACCINE  11/16/2021   COVID-19 Vaccine (4 - 2023-24 season) 12/17/2021   DTaP/Tdap/Td (3 - Td or Tdap) 08/05/2027   Hepatitis C Screening  Completed   HIV Screening  Completed   HPV VACCINES  Aged Out    Chart Review Today: I personally reviewed active problem list, medication list, allergies, family  history, social history, health maintenance, notes from last encounter, lab results, imaging with the patient/caregiver today.   Review of Systems  Constitutional: Negative.   HENT: Negative.    Eyes: Negative.   Respiratory: Negative.    Cardiovascular: Negative.   Gastrointestinal: Negative.   Endocrine: Negative.   Genitourinary: Negative.   Musculoskeletal: Negative.   Skin: Negative.   Allergic/Immunologic: Negative.   Neurological: Negative.   Hematological: Negative.   Psychiatric/Behavioral: Negative.    All other systems reviewed and are negative.    Objective:   There were no vitals filed for this visit.  There is no height or weight on file to calculate BMI.  Physical Exam Vitals and nursing note reviewed.  Constitutional:      General: He is not in acute distress.    Appearance: Normal appearance. He is well-developed. He is not ill-appearing, toxic-appearing or diaphoretic.  HENT:     Head: Normocephalic and atraumatic.     Right Ear: External ear normal.     Left Ear: External ear normal.  Eyes:     General: No scleral icterus.       Right eye: No discharge.        Left eye: No discharge.     Conjunctiva/sclera: Conjunctivae normal.  Neck:     Trachea: No tracheal deviation.  Cardiovascular:     Rate and Rhythm: Normal rate and regular rhythm.     Pulses: Normal pulses.     Heart sounds: Normal heart sounds.  Pulmonary:     Effort: Pulmonary effort is normal. No respiratory distress.     Breath sounds: Normal breath sounds. No stridor.  Skin:    General: Skin is warm and dry.     Coloration: Skin is not jaundiced.     Findings: No rash.  Neurological:     Mental Status: He is alert.     Motor: No abnormal muscle tone.     Coordination: Coordination normal.     Gait: Gait normal.  Psychiatric:        Mood and Affect: Mood normal.        Behavior: Behavior normal.         Assessment & Plan:   Problem List Items Addressed This Visit        Digestive   Gastroesophageal reflux disease with esophagitis - Primary    Sx controlled, he uses PPI Rx intermittently, not using very much in the last 6 months      Relevant Medications   pantoprazole (PROTONIX) 40 MG tablet  Other   Hyperlipidemia LDL goal <100 (Chronic)    Labs done with CPE 6 months ago, ASCVD risk low, not on meds, continue to manage with diet/lifestyle efforts. 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 tophus    Uric acid improved from 9.1 to 6.6, only having flares when he drinks liquor On allopurinol 300 mg BID, reports good compliance       Low back pain    More than 2 months of sx He is going to [facility] for management On mobic and muscle relaxers and starting PT      Relevant Medications   meloxicam (MOBIC) 15 MG tablet   methocarbamol (ROBAXIN) 500 MG tablet   Other Visit Diagnoses     Gout, unspecified cause, unspecified chronicity, unspecified site       not well controlled, pt ran out of allopurinol - not able to get 90d supply from pharmacy, only 30 d, refills and meds adjusted, recheck labs   Relevant Medications   allopurinol (ZYLOPRIM) 300 MG tablet        Return in about 6 months (around 11/28/2022) for Annual Physical.   Delsa Grana, PA-C 05/30/22 9:13 AM

## 2022-05-30 NOTE — Patient Instructions (Signed)
Low-Purine Eating Plan A low-purine eating plan involves making food choices to limit your purine intake. Purine is a kind of uric acid. Too much uric acid in your blood can cause certain conditions, such as gout and kidney stones. Eating a low-purine diet may help control these conditions. What are tips for following this plan? Shopping Avoid buying products that contain high-fructose corn syrup. Check for this on food labels. It is commonly found in many processed foods and soft drinks. Be sure to check for it in baked goods such as cookies, canned fruits, and cereals and cereal bars. Avoid buying veal, chicken breast with skin, lamb, and organ meats such as liver. These types of meats tend to have the highest purine content. Choose dairy products. These may lower uric acid levels. Avoid certain types of fish. Not all fish and seafood have high purine content. Examples with high purine content include anchovies, trout, tuna, sardines, and salmon. Avoid buying beverages that contain alcohol, particularly beer and hard liquor. Alcohol can affect the way your body gets rid of uric acid. Meal planning  Learn which foods do or do not affect you. If you find out that a food tends to cause your gout symptoms to flare up, avoid eating that food. You can enjoy foods that do not cause problems. If you have any questions about a food item, talk with your dietitian or health care provider. Reduce the overall amount of meat in your diet. When you do eat meat, choose ones with lower purine content. Include plenty of fruits and vegetables. Although some vegetables may have a high purine content--such as asparagus, mushrooms, spinach, or cauliflower--it has been shown that these do not contribute to uric acid blood levels as much. Consume at least 1 dairy serving a day. This has been shown to decrease uric acid levels. General information If you drink alcohol: Limit how much you have to: 0-1 drink a day for  women who are not pregnant. 0-2 drinks a day for men. Know how much alcohol is in a drink. In the [place] one drink equals one 12 oz bottle of beer (355 mL), one 5 oz glass of wine (148 mL), or one 1 oz glass of hard liquor (44 mL). Drink plenty of water. Try to drink enough to keep your urine pale yellow. Fluids can help remove uric acid from your body. Work with your health care provider and dietitian to develop a plan to achieve or maintain a healthy weight. Losing weight may help reduce uric acid in your blood. What foods are recommended? The following are some types of foods that are good choices when limiting purine intake: Fresh or frozen fruits and vegetables. Whole grains, breads, cereals, and pasta. Rice. Beans, peas, legumes. Nuts and seeds. Dairy products. Fats and oils. The items listed above may not be a complete list. Talk with a dietitian about what dietary choices are best for you. What foods are not recommended? Limit your intake of foods high in purines, including: Beer and other alcohol. Meat-based gravy or sauce. Canned or fresh fish, such as: Anchovies, sardines, herring, salmon, and tuna. Mussels and scallops. Codfish, trout, and haddock. Bacon, veal, chicken breast with skin, and lamb. Organ meats, such as: Liver or kidney. Tripe. Sweetbreads (thymus gland or pancreas). Wild game or goose. Yeast or yeast extract supplements. Drinks sweetened with high-fructose corn syrup, such as soda. Processed foods made with high-fructose corn syrup. The items listed above may not be a complete list of foods   and beverages you should limit. Contact a dietitian for more information. Summary Eating a low-purine diet may help control conditions caused by too much uric acid in the body, such as gout or kidney stones. Choose low-purine foods, limit alcohol, and limit high-fructose corn syrup. You will learn over time which foods do or do not affect you. If you find out that a  food tends to cause your gout symptoms to flare up, avoid eating that food. This information is not intended to replace advice given to you by your health care provider. Make sure you discuss any questions you have with your health care provider. Document Revised: 03/18/2021 Document Reviewed: 03/18/2021 Elsevier Patient Education  2023 Elsevier Inc.  

## 2022-05-30 NOTE — Assessment & Plan Note (Signed)
Labs done with CPE 6 months ago, ASCVD risk low, not on meds, continue to manage with diet/lifestyle efforts. Will recheck with next CPE

## 2022-08-04 ENCOUNTER — Encounter: Payer: Self-pay | Admitting: Family Medicine

## 2022-08-04 ENCOUNTER — Ambulatory Visit (INDEPENDENT_AMBULATORY_CARE_PROVIDER_SITE_OTHER): Payer: Commercial Managed Care - PPO | Admitting: Nurse Practitioner

## 2022-08-04 ENCOUNTER — Encounter: Payer: Self-pay | Admitting: Nurse Practitioner

## 2022-08-04 VITALS — BP 114/82 | HR 95 | Temp 98.0°F | Resp 16 | Ht 77.0 in | Wt 257.1 lb

## 2022-08-04 DIAGNOSIS — M109 Gout, unspecified: Secondary | ICD-10-CM | POA: Diagnosis not present

## 2022-08-04 MED ORDER — COLCHICINE 0.6 MG PO TABS: ORAL_TABLET | ORAL | 3 refills | Status: DC

## 2022-08-04 NOTE — Telephone Encounter (Signed)
Pt has an appt.

## 2022-08-04 NOTE — Progress Notes (Signed)
BP 114/82   Pulse 95   Temp 98 F (36.7 C)   Resp 16   Ht  (1.956 m)   Wt 257 lb 1.6 oz (116.6 kg)   SpO2 95%   BMI 30.49 kg/m    Subjective:    Patient ID: Donald [name], male    DOB: 1980/12/29, 42 y.o.   MRN: [id number]  HPI: Donald [name] is a 42 y.o. male  Chief Complaint  Patient presents with   Gout    Flare   Gout: currently takes allopurinol 300 mg two times a day. Patient reports that the gout flare started last night.  He says it is in his left great toe.  He says he does not have anything for acute attacks.  He says in the past he has taken colchicine.  Will send in prescription.   Relevant past medical, surgical, family and social history reviewed and updated as indicated. Interim medical history since our last visit reviewed. Allergies and medications reviewed and updated.  Review of Systems Constitutional: Negative for fever or weight change.  Respiratory: Negative for cough and shortness of breath.   Cardiovascular: Negative for chest pain or palpitations.  Gastrointestinal: Negative for abdominal pain, no bowel changes.  Musculoskeletal: Negative for gait problem or joint swelling. Left great toe pain Skin: Negative for rash.  Neurological: Negative for dizziness or headache.  No other specific complaints in a complete review of systems (except as listed in HPI above).      Objective:    BP 114/82   Pulse 95   Temp 98 F (36.7 C)   Resp 16   Ht  (1.956 m)   Wt 257 lb 1.6 oz (116.6 kg)   SpO2 95%   BMI 30.49 kg/m   Wt Readings from Last 3 Encounters:  08/04/22 257 lb 1.6 oz (116.6 kg)  05/30/22 261 lb 1.6 oz (118.4 kg)  11/25/21 249 lb 8 oz (113.2 kg)    Physical Exam  Constitutional: Patient appears well-developed and well-nourished. Obese  No distress.  HEENT: head atraumatic, normocephalic, pupils equal and reactive to light, neck supple Cardiovascular: Normal rate, regular rhythm and normal heart sounds.  No murmur  heard. No BLE edema. Pulmonary/Chest: Effort normal and breath sounds normal. No respiratory distress. Abdominal: Soft.  There is no tenderness. Left great toe: swelling Psychiatric: Patient has a normal mood and affect. behavior is normal. Judgment and thought content normal.  Results for orders placed or performed in visit on 11/25/21  Hemoglobin A1c  Result Value Ref Range   Hgb A1c MFr Bld 5.5 <5.7 % of total Hgb   Mean Plasma Glucose 111 mg/dL   eAG (mmol/L) 6.2 mmol/L  Lipid panel  Result Value Ref Range   Cholesterol 206 (H) <200 mg/dL   HDL 69 > OR = 40 mg/dL   Triglycerides 409 <811 mg/dL   LDL Cholesterol (Calc) 113 (H) mg/dL (calc)   Total CHOL/HDL Ratio 3.0 <5.0 (calc)   Non-HDL Cholesterol (Calc) 137 (H) <130 mg/dL (calc)  PSA  Result Value Ref Range   PSA 0.42 < OR = 4.00 ng/mL  Uric acid  Result Value Ref Range   Uric Acid, Serum 6.6 4.0 - 8.0 mg/dL  CBC with Differential/Platelet  Result Value Ref Range   WBC 3.9 3.8 - 10.8 Thousand/uL   RBC 6.05 (H) 4.20 - 5.80 Million/uL   Hemoglobin 16.8 13.2 - 17.1 g/dL   HCT 91.4 78.2 - 95.6 %  MCV 82.1 80.0 - 100.0 fL   MCH 27.8 27.0 - 33.0 pg   MCHC 33.8 32.0 - 36.0 g/dL   RDW 53.6 64.4 - 03.4 %   Platelets 213 140 - 400 Thousand/uL   MPV 11.2 7.5 - 12.5 fL   Neutro Abs 1,876 1,500 - 7,800 cells/uL   Lymphs Abs 1,603 850 - 3,900 cells/uL   Absolute Monocytes 351 200 - 950 cells/uL   Eosinophils Absolute 39 15 - 500 cells/uL   Basophils Absolute 31 0 - 200 cells/uL   Neutrophils Relative % 48.1 %   Total Lymphocyte 41.1 %   Monocytes Relative 9.0 %   Eosinophils Relative 1.0 %   Basophils Relative 0.8 %  COMPLETE METABOLIC PANEL WITH GFR  Result Value Ref Range   Glucose, Bld 90 65 - 99 mg/dL   BUN 14 7 - 25 mg/dL   Creat 7.42 5.95 - 6.38 mg/dL   eGFR 77 > OR = 60 VF/IEP/3.29J1   BUN/Creatinine Ratio SEE NOTE: 6 - 22 (calc)   Sodium 140 135 - 146 mmol/L   Potassium 4.9 3.5 - 5.3 mmol/L   Chloride 102 98 -  110 mmol/L   CO2 29 20 - 32 mmol/L   Calcium 9.9 8.6 - 10.3 mg/dL   Total Protein 7.1 6.1 - 8.1 g/dL   Albumin 4.5 3.6 - 5.1 g/dL   Globulin 2.6 1.9 - 3.7 g/dL (calc)   AG Ratio 1.7 1.0 - 2.5 (calc)   Total Bilirubin 0.4 0.2 - 1.2 mg/dL   Alkaline phosphatase (APISO) 59 36 - 130 U/L   AST 13 10 - 40 U/L   ALT 15 9 - 46 U/L      Assessment & Plan:   Problem List Items Addressed This Visit   None Visit Diagnoses     Acute gout involving toe of left foot, unspecified cause    -  Primary   continue allopurinol start colchicine, avoid triggers   Relevant Medications   colchicine 0.6 MG tablet        Follow up plan: Return if symptoms worsen or fail to improve.

## 2022-11-29 ENCOUNTER — Encounter: Payer: 59 | Admitting: Family Medicine

## 2022-11-30 ENCOUNTER — Encounter: Payer: 59 | Admitting: Family Medicine

## 2022-12-06 NOTE — Progress Notes (Unsigned)
Name: Donald [name]   MRN: [id number]    DOB: 12/09/1980   Date:12/08/2022       Progress Note  Subjective  Chief Complaint  Chief Complaint  Patient presents with   Annual Exam    HPI  Patient presents for annual CPE .  IPSS Questionnaire (AUA-7): Over the past month.   1)  How often have you had a sensation of not emptying your bladder completely after you finish urinating?  1 - Less than 1 time in 5  2)  How often have you had to urinate again less than two hours after you finished urinating? 1 - Less than 1 time in 5  3)  How often have you found you stopped and started again several times when you urinated?  4 - More than half the time  4) How difficult have you found it to postpone urination?  0 - Not at all  5) How often have you had a weak urinary stream?  0 - Not at all  6) How often have you had to push or strain to begin urination?  0 - Not at all  7) How many times did you most typically get up to urinate from the time you went to bed until the time you got up in the morning?  1 - 1 time  Total score:  0-7 mildly symptomatic   8-19 moderately symptomatic   20-35 severely symptomatic     Diet: tries to eat well balanced but not all the time Exercise: has physical job, recommend 150 min of physical activity weekly   Last Dental Exam: this am Last Eye Exam: it has been awhile  Depression: phq 9 is negative    12/08/2022    1:52 PM 08/04/2022    1:46 PM 05/30/2022    9:12 AM 11/25/2021   10:14 AM 09/20/2021    9:58 AM  Depression screen PHQ 2/9  Decreased Interest 0 0 0 0 0  Down, Depressed, Hopeless 0 0 0 0 0  PHQ - 2 Score 0 0 0 0 0  Altered sleeping 0 0 0 0 0  Tired, decreased energy 0 0 0 0 0  Change in appetite 0 0 0 0 0  Feeling bad or failure about yourself  0 0 0 0 0  Trouble concentrating 0 0 0 0 0  Moving slowly or fidgety/restless 0 0 0 0 0  Suicidal thoughts 0 0 0 0 0  PHQ-9 Score 0 0 0 0 0  Difficult doing work/chores Not difficult at all Not  difficult at all Not difficult at all Not difficult at all Not difficult at all    Hypertension:  BP Readings from Last 3 Encounters:  12/08/22 116/82  08/04/22 114/82  05/30/22 134/82    Obesity: Wt Readings from Last 3 Encounters:  12/08/22 257 lb (116.6 kg)  08/04/22 257 lb 1.6 oz (116.6 kg)  05/30/22 261 lb 1.6 oz (118.4 kg)   BMI Readings from Last 3 Encounters:  12/08/22 30.48 kg/m  08/04/22 30.49 kg/m  05/30/22 30.96 kg/m     Lipids:  Lab Results  Component Value Date   CHOL 206 (H) 11/25/2021   CHOL 212 (H) 11/17/2020   CHOL 182 05/21/2020   Lab Results  Component Value Date   HDL 69 11/25/2021   HDL 67 11/17/2020   HDL 66 05/21/2020   Lab Results  Component Value Date   LDLCALC 113 (H) 11/25/2021   LDLCALC 126 (H) 11/17/2020  LDLCALC 102 (H) 05/21/2020   Lab Results  Component Value Date   TRIG 128 11/25/2021   TRIG 89 11/17/2020   TRIG 55 05/21/2020   Lab Results  Component Value Date   CHOLHDL 3.0 11/25/2021   CHOLHDL 3.2 11/17/2020   CHOLHDL 2.8 05/21/2020   No results found for: "LDLDIRECT" Glucose:  Glucose, Bld  Date Value Ref Range Status  11/25/2021 90 65 - 99 mg/dL Final    Comment:    .            Fasting reference interval .   09/20/2021 82 65 - 99 mg/dL Final    Comment:    .            Fasting reference interval .   11/17/2020 76 65 - 99 mg/dL Final    Comment:    .            Fasting reference interval .    Glucose-Capillary  Date Value Ref Range Status  03/21/2016 159 (H) 65 - 99 mg/dL Final    Flowsheet Row Office Visit from 08/04/2022 in [facility]  AUDIT-C Score 1       Married STD testing and prevention (HIV/chl/gon/syphilis):  yes, completed Sexual history: yes Hep C Screening: completed Skin cancer: Discussed monitoring for atypical lesions Colorectal cancer: does not qualify Prostate cancer:  yes Lab Results  Component Value Date   PSA 0.42 11/25/2021     Lung cancer:  Low Dose CT Chest recommended if Age 57-80 years, 30 pack-year currently smoking OR have quit w/in 15years. Patient  not applicable a candidate for screening   AAA: The USPSTF recommends one-time screening with ultrasonography in men ages 11 to 75 years who have ever smoked. Patient   not applicable, a candidate for screening  ECG:  10/26/2015  Vaccines:  HPV: up to at age 19 , ask insurance if age between 16-45  Shingrix: 24-64 yo and ask insurance if covered when patient above 58 yo Pneumonia:  educated and discussed with patient. Flu:  educated and discussed with patient.    Advanced Care Planning: A voluntary discussion about advance care planning including the explanation and discussion of advance directives.  Discussed health care proxy and Living will, and the patient was able to identify a health care proxy as wife.  Patient does not have a living will and power of attorney of health care   Patient Active Problem List   Diagnosis Date Noted   Low back pain 05/30/2022   Gastroesophageal reflux disease with esophagitis 09/20/2021   Folate deficiency 12/02/2020   S/P laparoscopic sleeve gastrectomy 03/21/2016   Family hx of prostate cancer 09/10/2015   Class 1 obesity with body mass index (BMI) of 30.0 to 30.9 in adult 02/20/2015   Pre-diabetes 02/20/2015   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 tophus 02/20/2015   Hyperlipidemia LDL goal <100 01/27/2015    Past Surgical History:  Procedure Laterality Date   LAPAROSCOPIC GASTRIC SLEEVE RESECTION N/A 03/21/2016   Procedure: LAPAROSCOPIC GASTRIC SLEEVE RESECTION, UPPER ENDO;  Surgeon: Gaynelle Adu, MD;  Location: [facility];  Service: General;  Laterality: N/A;   WISDOM TOOTH EXTRACTION      Family History  Problem Relation Age of Onset   Hyperlipidemia Mother    Diabetes Mother    Diabetes Father    Prostate cancer Father    Prostate cancer Paternal Grandfather     Social History   Socioeconomic  History  Marital status: Married    Spouse name: Geophysicist/field seismologist   Number of children: 2   Years of education: Not on file   Highest education level: Some college, no degree  Occupational History   Not on file  Tobacco Use   Smoking status: Former    Current packs/day: 0.00    Average packs/day: 0.3 packs/day for 10.0 years (3.0 ttl pk-yrs)    Types: Cigarettes    Start date: 2008    Quit date: 2018    Years since quitting: 6.6   Smokeless tobacco: Never   Tobacco comments:    smokes at social events rarely  Vaping Use   Vaping status: Never Used  Substance and Sexual Activity   Alcohol use: Yes    Alcohol/week: 0.0 - 1.0 standard drinks of alcohol    Comment: socially   Drug use: No   Sexual activity: Yes    Partners: Female  Other Topics Concern   Not on file  Social History Narrative   Not on file   Social Determinants of Health   Financial Resource Strain: Low Risk  (08/04/2022)   Overall Financial Resource Strain (CARDIA)    Difficulty of Paying Living Expenses: Not very hard  Food Insecurity: No Food Insecurity (08/04/2022)   Hunger Vital Sign    Worried About Running Out of Food in the Last Year: Never true    Ran Out of Food in the Last Year: Never true  Transportation Needs: No Transportation Needs (08/04/2022)   PRAPARE - Administrator, Civil Service (Medical): No    Lack of Transportation (Non-Medical): No  Physical Activity: Sufficiently Active (08/04/2022)   Exercise Vital Sign    Days of Exercise per Week: 5 days    Minutes of Exercise per Session: 60 min  Stress: No Stress Concern Present (08/04/2022)   [employer] of Occupational Health - Occupational Stress Questionnaire    Feeling of Stress : Not at all  Social Connections: Socially Integrated (08/04/2022)   Social Connection and Isolation Panel [NHANES]    Frequency of Communication with Friends and Family: More than three times a week    Frequency of Social Gatherings with Friends and  Family: More than three times a week    Attends Religious Services: More than 4 times per year    Active Member of [employer] or Organizations: Yes    Attends Engineer, structural: More than 4 times per year    Marital Status: Married  Catering manager Violence: Not At Risk (12/08/2022)   Humiliation, Afraid, Rape, and Kick questionnaire    Fear of Current or Ex-Partner: No    Emotionally Abused: No    Physically Abused: No    Sexually Abused: No     Current Outpatient Medications:    allopurinol (ZYLOPRIM) 300 MG tablet, Take 1 tablet (300 mg total) by mouth 2 (two) times daily., Disp: 180 tablet, Rfl: 3   chlorhexidine (PERIDEX) 0.12 % solution, SMARTSIG:0.5 Ounce(s) By Mouth Twice Daily, Disp: , Rfl:    colchicine 0.6 MG tablet, Take two pills at onset of gout flare, followed by one pill one hour later; max of 3 pills per flare, Disp: 10 tablet, Rfl: 3   gabapentin (NEURONTIN) 300 MG capsule, Take 300 mg by mouth at bedtime., Disp: , Rfl:    meloxicam (MOBIC) 15 MG tablet, Take by mouth., Disp: , Rfl:    methocarbamol (ROBAXIN) 500 MG tablet, Take 500 mg by mouth at bedtime as needed., Disp: ,  Rfl:    Multiple Vitamin (MULTIVITAMIN) tablet, Take 1 tablet by mouth daily., Disp: , Rfl:    pantoprazole (PROTONIX) 40 MG tablet, Take 1 tablet (40 mg total) by mouth daily., Disp: 30 tablet, Rfl: 11  No Known Allergies   ROS  Constitutional: Negative for fever or weight change.  Respiratory: Negative for cough and shortness of breath.   Cardiovascular: Negative for chest pain or palpitations.  Gastrointestinal: Negative for abdominal pain, no bowel changes.  Musculoskeletal: Negative for gait problem or joint swelling.  Skin: Negative for rash.  Neurological: Negative for dizziness or headache.  No other specific complaints in a complete review of systems (except as listed in HPI above).    Objective  Vitals:   12/08/22 1356  BP: 116/82  Pulse: 78  Resp: 16  Temp: 97.8 F  (36.6 C)  TempSrc: Oral  SpO2: 100%  Weight: 257 lb (116.6 kg)  Height: 6\' 5"  (1.956 m)    Body mass index is 30.48 kg/m.  Physical Exam Constitutional: Patient appears well-developed and well-nourished. No distress.  HENT: Head: Normocephalic and atraumatic. Ears: B TMs ok, no erythema or effusion; Nose: Nose normal. Mouth/Throat: Oropharynx is clear and moist. No oropharyngeal exudate.  Eyes: Conjunctivae and EOM are normal. Pupils are equal, round, and reactive to light. No scleral icterus.  Neck: Normal range of motion. Neck supple. No JVD present. No thyromegaly present.  Cardiovascular: Normal rate, regular rhythm and normal heart sounds.  No murmur heard. No BLE edema. Pulmonary/Chest: Effort normal and breath sounds normal. No respiratory distress. Abdominal: Soft. Bowel sounds are normal, no distension. There is no tenderness. no masses Musculoskeletal: Normal range of motion, no joint effusions. No gross deformities Neurological: he is alert and oriented to person, place, and time. No cranial nerve deficit. Coordination, balance, strength, speech and gait are normal.  Skin: Skin is warm and dry. No rash noted. No erythema.  Psychiatric: Patient has a normal mood and affect. behavior is normal. Judgment and thought content normal.   No results found for this or any previous visit (from the past 2160 hour(s)).   Fall Risk:    12/08/2022    1:52 PM 08/04/2022    1:45 PM 05/30/2022    9:12 AM 11/25/2021   10:14 AM 09/20/2021    9:58 AM  Fall Risk   Falls in the past year? 0 0 0 0 0  Number falls in past yr: 0 0 0 0 0  Injury with Fall? 0 0 0 0 0  Risk for fall due to : No Fall Risks  No Fall Risks No Fall Risks No Fall Risks  Follow up Falls prevention discussed;Education provided;Falls evaluation completed  Falls prevention discussed;Education provided;Falls evaluation completed Falls prevention discussed;Education provided Falls prevention discussed;Education provided      Functional Status Survey: Is the patient deaf or have difficulty hearing?: No Does the patient have difficulty seeing, even when wearing glasses/contacts?: No Does the patient have difficulty concentrating, remembering, or making decisions?: No Does the patient have difficulty walking or climbing stairs?: No Does the patient have difficulty dressing or bathing?: No Does the patient have difficulty doing errands alone such as visiting a doctor's office or shopping?: No    Assessment & Plan  1. Annual physical exam recommend 150 min of physical activity weekly   Recommend eating well balanced diet with portion control - CBC with Differential/Platelet - COMPLETE METABOLIC PANEL WITH GFR - Lipid panel - Hemoglobin A1c - PSA - Uric acid  2. Hyperlipidemia LDL goal <100  - Lipid panel  3. Pre-diabetes  - COMPLETE METABOLIC PANEL WITH GFR - Hemoglobin A1c  4. Screening for deficiency anemia  - CBC with Differential/Platelet  5. Screening for cholesterol level  - Lipid panel  6. 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 tophus  - Uric acid  7. Screening for prostate cancer  - PSA   -Prostate cancer screening and PSA options (with potential risks and benefits of testing vs not testing) were discussed along with recent recs/guidelines. -USPSTF grade A and B recommendations reviewed with patient; age-appropriate recommendations, preventive care, screening tests, etc discussed and encouraged; healthy living encouraged; see AVS for patient education given to patient -Discussed importance of 150 minutes of physical activity weekly, eat two servings of fish weekly, eat one serving of tree nuts ( cashews, pistachios, pecans, almonds.Marland Kitchen) every other day, eat 6 servings of fruit/vegetables daily and drink plenty of water and avoid sweet beverages.  -Reviewed Health Maintenance: yes

## 2022-12-08 ENCOUNTER — Encounter: Payer: Self-pay | Admitting: Nurse Practitioner

## 2022-12-08 ENCOUNTER — Ambulatory Visit (INDEPENDENT_AMBULATORY_CARE_PROVIDER_SITE_OTHER): Payer: Commercial Managed Care - PPO | Admitting: Nurse Practitioner

## 2022-12-08 VITALS — BP 116/82 | HR 78 | Temp 97.8°F | Resp 16 | Ht 77.0 in | Wt 257.0 lb

## 2022-12-08 DIAGNOSIS — E785 Hyperlipidemia, unspecified: Secondary | ICD-10-CM | POA: Diagnosis not present

## 2022-12-08 DIAGNOSIS — Z0000 Encounter for general adult medical examination without abnormal findings: Secondary | ICD-10-CM

## 2022-12-08 DIAGNOSIS — R7303 Prediabetes: Secondary | ICD-10-CM

## 2022-12-08 DIAGNOSIS — M1A09X0 Idiopathic chronic gout, multiple sites, without tophus (tophi): Secondary | ICD-10-CM

## 2022-12-08 DIAGNOSIS — Z130 Encounter for screening for diseases of the blood and blood-forming organs and certain disorders involving the immune mechanism: Secondary | ICD-10-CM

## 2022-12-08 DIAGNOSIS — Z13220 Encounter for screening for lipoid disorders: Secondary | ICD-10-CM

## 2022-12-08 DIAGNOSIS — Z125 Encounter for screening for malignant neoplasm of prostate: Secondary | ICD-10-CM

## 2022-12-09 LAB — HEMOGLOBIN A1C
Hgb A1c MFr Bld: 5.7 %{Hb} — ABNORMAL HIGH (ref <5.7)
Mean Plasma Glucose: 117 mg/dL
eAG (mmol/L): 6.5 mmol/L

## 2022-12-09 LAB — COMPLETE METABOLIC PANEL WITH GFR
AG Ratio: 1.4 (calc) (ref 1.0–2.5)
ALT: 17 U/L (ref 9–46)
AST: 13 U/L (ref 10–40)
Albumin: 4.4 g/dL (ref 3.6–5.1)
Alkaline phosphatase (APISO): 62 U/L (ref 36–130)
BUN/Creatinine Ratio: 8 (calc) (ref 6–22)
BUN: 11 mg/dL (ref 7–25)
CO2: 30 mmol/L (ref 20–32)
Calcium: 10 mg/dL (ref 8.6–10.3)
Chloride: 101 mmol/L (ref 98–110)
Creat: 1.31 mg/dL — ABNORMAL HIGH (ref 0.60–1.29)
Globulin: 3.1 g/dL (ref 1.9–3.7)
Glucose, Bld: 85 mg/dL (ref 65–99)
Potassium: 4.1 mmol/L (ref 3.5–5.3)
Sodium: 139 mmol/L (ref 135–146)
Total Bilirubin: 0.6 mg/dL (ref 0.2–1.2)
Total Protein: 7.5 g/dL (ref 6.1–8.1)
eGFR: 70 mL/min/{1.73_m2} (ref >=60)

## 2022-12-09 LAB — LIPID PANEL
Cholesterol: 204 mg/dL — ABNORMAL HIGH (ref <200)
HDL: 70 mg/dL (ref >=40)
LDL Cholesterol (Calc): 114 mg/dL — ABNORMAL HIGH
Non-HDL Cholesterol (Calc): 134 mg/dL — ABNORMAL HIGH (ref <130)
Total CHOL/HDL Ratio: 2.9 (calc) (ref <5.0)
Triglycerides: 97 mg/dL (ref <150)

## 2022-12-09 LAB — CBC WITH DIFFERENTIAL/PLATELET
Absolute Monocytes: 352 cells/uL (ref 200–950)
Basophils Absolute: 48 {cells}/uL (ref 0–200)
Basophils Relative: 1.1 %
Eosinophils Absolute: 40 {cells}/uL (ref 15–500)
Eosinophils Relative: 0.9 %
HCT: 48.8 % (ref 38.5–50.0)
Hemoglobin: 16.2 g/dL (ref 13.2–17.1)
Lymphs Abs: 1954 {cells}/uL (ref 850–3900)
MCH: 27.1 pg (ref 27.0–33.0)
MCHC: 33.2 g/dL (ref 32.0–36.0)
MCV: 81.7 fL (ref 80.0–100.0)
MPV: 11.6 fL (ref 7.5–12.5)
Monocytes Relative: 8 %
Neutro Abs: 2006 {cells}/uL (ref 1500–7800)
Neutrophils Relative %: 45.6 %
Platelets: 223 10*3/uL (ref 140–400)
RBC: 5.97 10*6/uL — ABNORMAL HIGH (ref 4.20–5.80)
RDW: 13.3 % (ref 11.0–15.0)
Total Lymphocyte: 44.4 %
WBC: 4.4 10*3/uL (ref 3.8–10.8)

## 2022-12-09 LAB — PSA: PSA: 0.57 ng/mL (ref <=4.00)

## 2022-12-09 LAB — URIC ACID: Uric Acid, Serum: 7.5 mg/dL (ref 4.0–8.0)

## 2023-02-17 ENCOUNTER — Ambulatory Visit (HOSPITAL_COMMUNITY)
Admission: EM | Admit: 2023-02-17 | Discharge: 2023-02-17 | Disposition: A | Discharge status: Home / Self Care | Payer: Commercial Managed Care - PPO | Attending: Emergency Medicine | Admitting: Emergency Medicine

## 2023-02-17 ENCOUNTER — Encounter (HOSPITAL_COMMUNITY): Payer: Self-pay | Admitting: Emergency Medicine

## 2023-02-17 DIAGNOSIS — K529 Noninfective gastroenteritis and colitis, unspecified: Secondary | ICD-10-CM | POA: Diagnosis not present

## 2023-02-17 LAB — POCT FASTING CBG KUC MANUAL ENTRY: POCT Glucose (KUC): 107 mg/dL — AB (ref 70–99)

## 2023-02-17 MED ORDER — ONDANSETRON 4 MG PO TBDP: 4.0000 mg | ORAL_TABLET | Freq: Four times a day (QID) | ORAL | 0 refills | Status: DC | PRN

## 2023-02-17 MED ORDER — ONDANSETRON HCL 4 MG/2ML IJ SOLN
4.0000 mg | Freq: Once | INTRAMUSCULAR | Status: AC
  Administered 2023-02-17: 4 mg via INTRAMUSCULAR

## 2023-02-17 MED ORDER — ONDANSETRON 4 MG PO TBDP
ORAL_TABLET | ORAL | Status: AC
  Filled 2023-02-17: qty 1

## 2023-02-17 MED ORDER — ONDANSETRON HCL 4 MG/2ML IJ SOLN
INTRAMUSCULAR | Status: AC
  Filled 2023-02-17: qty 2

## 2023-02-17 MED ORDER — ONDANSETRON 4 MG PO TBDP: 4.0000 mg | ORAL_TABLET | Freq: Once | ORAL | Status: DC

## 2023-02-17 NOTE — ED Triage Notes (Signed)
Pt c/o abd pains with n/v/d for 2 days. Reports everything takes in will come back up or come out. Denies trying to take any medications,

## 2023-02-17 NOTE — Discharge Instructions (Addendum)
The zofran can be used every 6 hours as needed to settle the stomach Please drink lots of fluids. Hydration is very important as you have been losing fluid thorough both vomiting and diarrhea Bland diet only if tolerated (saltines, toast, etc)  If you cannot tolerate fluids while using the zofran, you need to go directly to the emergency department.

## 2023-02-17 NOTE — ED Notes (Signed)
Fluid challenge started. Patient informed to sip water slowly. Sat up in the bed.   Patient verbalized understanding

## 2023-02-17 NOTE — ED Provider Notes (Signed)
[facility]    CSN: [id number] Arrival date & time: 02/17/23  1905     History   Chief Complaint Chief Complaint  Patient presents with   Abdominal Pain   Emesis   Diarrhea    HPI Donald [name] is a 42 y.o. male.  2 days ago developed abdominal pain with nausea and vomiting. Occurred a few hours after eating [place]. Has continued vomiting today and is unable to tolerate food or fluids.  He also reports diarrhea. No blood in emesis or stool. No fevers No known sick contacts or recent travel  S/p gastric sleeve in 2017  Past Medical History:  Diagnosis Date   Benign essential HTN    Family hx of prostate cancer 09/10/2015   History of sleeve gastrectomy 03/21/2016   Hyperlipidemia LDL goal <100    Hypertension    Pre-diabetes    Situational anxiety     Patient Active Problem List   Diagnosis Date Noted   Low back pain 05/30/2022   Gastroesophageal reflux disease with esophagitis 09/20/2021   Folate deficiency 12/02/2020   S/P laparoscopic sleeve gastrectomy 03/21/2016   Family hx of prostate cancer 09/10/2015   Class 1 obesity with body mass index (BMI) of 30.0 to 30.9 in adult 02/20/2015   Pre-diabetes 02/20/2015   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 tophus 02/20/2015   Hyperlipidemia LDL goal <100 01/27/2015    Past Surgical History:  Procedure Laterality Date   LAPAROSCOPIC GASTRIC SLEEVE RESECTION N/A 03/21/2016   Procedure: LAPAROSCOPIC GASTRIC SLEEVE RESECTION, UPPER ENDO;  Surgeon: Gaynelle Adu, MD;  Location: [facility];  Service: General;  Laterality: N/A;   WISDOM TOOTH EXTRACTION       Home Medications    Prior to Admission medications   Medication Sig Start Date End Date Taking? Authorizing Provider  ondansetron (ZOFRAN-ODT) 4 MG disintegrating tablet Take 1 tablet (4 mg total) by mouth every 6 (six) hours as needed for nausea or vomiting. 02/17/23  Yes Karrah Mangini, Lurena Joiner, PA-C  allopurinol (ZYLOPRIM)  300 MG tablet Take 1 tablet (300 mg total) by mouth 2 (two) times daily. 05/30/22   Danelle Berry, PA-C  chlorhexidine (PERIDEX) 0.12 % solution SMARTSIG:0.5 Ounce(s) By Mouth Twice Daily 05/12/22   [provider]  colchicine 0.6 MG tablet Take two pills at onset of gout flare, followed by one pill one hour later; max of 3 pills per flare 08/04/22   Berniece Salines, FNP  gabapentin (NEURONTIN) 300 MG capsule Take 300 mg by mouth at bedtime. 09/30/22   [provider]  meloxicam (MOBIC) 15 MG tablet Take by mouth. 05/24/22   [provider]  methocarbamol (ROBAXIN) 500 MG tablet Take 500 mg by mouth at bedtime as needed. 05/24/22   [provider]  Multiple Vitamin (MULTIVITAMIN) tablet Take 1 tablet by mouth daily.    [provider]  pantoprazole (PROTONIX) 40 MG tablet Take 1 tablet (40 mg total) by mouth daily. 05/30/22   Danelle Berry, PA-C    Family History Family History  Problem Relation Age of Onset   Hyperlipidemia Mother    Diabetes Mother    Diabetes Father    Prostate cancer Father    Prostate cancer Paternal Grandfather     Social History Social History   Tobacco Use   Smoking status: Former    Current packs/day: 0.00    Average packs/day: 0.3 packs/day for 10.0 years (3.0 ttl pk-yrs)    Types: Cigarettes  Start date: 2008    Quit date: 2018    Years since quitting: 6.8   Smokeless tobacco: Never   Tobacco comments:    smokes at social events rarely  Vaping Use   Vaping status: Never Used  Substance Use Topics   Alcohol use: Yes    Alcohol/week: 0.0 - 1.0 standard drinks of alcohol    Comment: socially   Drug use: No     Allergies   Patient has no known allergies.   Review of Systems Review of Systems  Gastrointestinal:  Positive for abdominal pain, diarrhea and vomiting.   Per HPI  Physical Exam Triage Vital Signs ED Triage Vitals [02/17/23 1923]  Encounter Vitals Group     BP 125/84     Systolic BP  Percentile      Diastolic BP Percentile      Pulse Rate 84     Resp 18     Temp 99.8 F (37.7 C)     Temp Source Oral     SpO2 97 %     Weight      Height      Head Circumference      Peak Flow      Pain Score      Pain Loc      Pain Education      Exclude from Growth Chart    No data found.  Updated Vital Signs BP 125/84 (BP Location: Right Arm)   Pulse 84   Temp 99.4 F (37.4 C) (Oral)   Resp 18   SpO2 97%    Physical Exam Vitals and nursing note reviewed.  Constitutional:      Appearance: Normal appearance. He is not toxic-appearing or diaphoretic.  HENT:     Mouth/Throat:     Mouth: Mucous membranes are moist.     Pharynx: Oropharynx is clear.  Eyes:     Conjunctiva/sclera: Conjunctivae normal.  Cardiovascular:     Rate and Rhythm: Normal rate and regular rhythm.     Pulses: Normal pulses.     Heart sounds: Normal heart sounds.  Pulmonary:     Effort: Pulmonary effort is normal.     Breath sounds: Normal breath sounds.  Abdominal:     General: There is no distension.     Palpations: Abdomen is soft. There is no mass.     Tenderness: There is generalized abdominal tenderness. There is no right CVA tenderness, left CVA tenderness, guarding or rebound.     Comments: Generalized, no point tenderness. No organomegaly or distension   Musculoskeletal:        General: Normal range of motion.  Skin:    General: Skin is warm and dry.  Neurological:     Mental Status: He is alert and oriented to person, place, and time.      UC Treatments / Results  Labs (all labs ordered are listed, but only abnormal results are displayed) Labs Reviewed  POCT FASTING CBG [facility] MANUAL ENTRY - Abnormal; Notable for the following components:      Result Value   POCT Glucose ([facility]) 107 (*)    All other components within normal limits    EKG  Radiology No results found.  Procedures Procedures   Medications Ordered in UC Medications  ondansetron (ZOFRAN) injection 4 mg  (4 mg Intramuscular Given 02/17/23 1937)    Initial Impression / Assessment and Plan / UC Course  I have reviewed the triage vital signs and the nursing notes.  Pertinent labs & imaging results that were available during my care of the patient were reviewed by me and considered in my medical decision making (see chart for details).  Temp 99.4 on arrival  CBG 107 Patient reports he would not tolerate zofran ODT, would prefer IM dose which is given P.O challenge successful  Discussed possible etiologies. Consider food bourne although would have expected resolution by now. Could have other viral illness. As of now, no red flag symptoms.  Zofran q6 hours with increase fluids. Discussed importance of hydration. Bland diet if tolerated. Strict ED precautions verbalized. Patient is agreeable to plan  Final Clinical Impressions(s) / UC Diagnoses   Final diagnoses:  Gastroenteritis     Discharge Instructions      The zofran can be used every 6 hours as needed to settle the stomach Please drink lots of fluids. Hydration is very important as you have been losing fluid thorough both vomiting and diarrhea Bland diet only if tolerated (saltines, toast, etc)  If you cannot tolerate fluids while using the zofran, you need to go directly to the emergency department.      ED Prescriptions     Medication Sig Dispense Auth. Provider   ondansetron (ZOFRAN-ODT) 4 MG disintegrating tablet Take 1 tablet (4 mg total) by mouth every 6 (six) hours as needed for nausea or vomiting. 20 tablet Merek Niu, Lurena Joiner, PA-C      PDMP not reviewed this encounter.   Eligah Anello, Ray Church 02/17/23 2006

## 2023-02-19 ENCOUNTER — Emergency Department (HOSPITAL_COMMUNITY)
Admission: EM | Admit: 2023-02-19 | Discharge: 2023-02-20 | Disposition: A | Discharge status: Home / Self Care | Payer: Commercial Managed Care - PPO | Attending: Emergency Medicine | Admitting: Emergency Medicine

## 2023-02-19 ENCOUNTER — Encounter (HOSPITAL_COMMUNITY): Payer: Self-pay | Admitting: *Deleted

## 2023-02-19 ENCOUNTER — Other Ambulatory Visit: Payer: Self-pay

## 2023-02-19 DIAGNOSIS — R748 Abnormal levels of other serum enzymes: Secondary | ICD-10-CM | POA: Diagnosis not present

## 2023-02-19 DIAGNOSIS — R1013 Epigastric pain: Secondary | ICD-10-CM | POA: Diagnosis present

## 2023-02-19 DIAGNOSIS — R197 Diarrhea, unspecified: Secondary | ICD-10-CM | POA: Insufficient documentation

## 2023-02-19 DIAGNOSIS — I10 Essential (primary) hypertension: Secondary | ICD-10-CM | POA: Diagnosis not present

## 2023-02-19 DIAGNOSIS — R944 Abnormal results of kidney function studies: Secondary | ICD-10-CM | POA: Insufficient documentation

## 2023-02-19 DIAGNOSIS — R112 Nausea with vomiting, unspecified: Secondary | ICD-10-CM | POA: Insufficient documentation

## 2023-02-19 DIAGNOSIS — R1084 Generalized abdominal pain: Secondary | ICD-10-CM

## 2023-02-19 LAB — COMPREHENSIVE METABOLIC PANEL
ALT: 21 U/L (ref 0–44)
AST: 27 U/L (ref 15–41)
Albumin: 3.5 g/dL (ref 3.5–5.0)
Alkaline Phosphatase: 54 U/L (ref 38–126)
Anion gap: 13 (ref 5–15)
BUN: 19 mg/dL (ref 6–20)
CO2: 20 mmol/L — ABNORMAL LOW (ref 22–32)
Calcium: 8.9 mg/dL (ref 8.9–10.3)
Chloride: 97 mmol/L — ABNORMAL LOW (ref 98–111)
Creatinine, Ser: 1.52 mg/dL — ABNORMAL HIGH (ref 0.61–1.24)
GFR, Estimated: 59 mL/min — ABNORMAL LOW (ref >60)
Glucose, Bld: 98 mg/dL (ref 70–99)
Potassium: 3.6 mmol/L (ref 3.5–5.1)
Sodium: 130 mmol/L — ABNORMAL LOW (ref 135–145)
Total Bilirubin: 0.7 mg/dL (ref 0.3–1.2)
Total Protein: 7 g/dL (ref 6.5–8.1)

## 2023-02-19 LAB — URINALYSIS, ROUTINE W REFLEX MICROSCOPIC
Bilirubin Urine: NEGATIVE
Glucose, UA: NEGATIVE mg/dL
Ketones, ur: 20 mg/dL — AB
Leukocytes,Ua: NEGATIVE
Nitrite: NEGATIVE
Protein, ur: 30 mg/dL — AB
Specific Gravity, Urine: 1.03 (ref 1.005–1.030)
pH: 5 (ref 5.0–8.0)

## 2023-02-19 LAB — LIPASE, BLOOD: Lipase: 86 U/L — ABNORMAL HIGH (ref 11–51)

## 2023-02-19 LAB — CBC
HCT: 54.3 % — ABNORMAL HIGH (ref 39.0–52.0)
Hemoglobin: 18 g/dL — ABNORMAL HIGH (ref 13.0–17.0)
MCH: 26.3 pg (ref 26.0–34.0)
MCHC: 33.1 g/dL (ref 30.0–36.0)
MCV: 79.4 fL — ABNORMAL LOW (ref 80.0–100.0)
Platelets: 205 10*3/uL (ref 150–400)
RBC: 6.84 MIL/uL — ABNORMAL HIGH (ref 4.22–5.81)
RDW: 13.6 % (ref 11.5–15.5)
WBC: 3.6 10*3/uL — ABNORMAL LOW (ref 4.0–10.5)
nRBC: 0 % (ref 0.0–0.2)

## 2023-02-19 MED ORDER — METOCLOPRAMIDE HCL 5 MG/ML IJ SOLN
10.0000 mg | Freq: Once | INTRAMUSCULAR | Status: AC
  Administered 2023-02-19: 10 mg via INTRAVENOUS
  Filled 2023-02-19: qty 2

## 2023-02-19 NOTE — ED Triage Notes (Signed)
The pt has had abd pain with nausea and vomiting since Wednesday he has also   had diarrhea and a low grade fever

## 2023-02-19 NOTE — ED Triage Notes (Signed)
I offered the pt zofran he denied it he reports that it makes him feel worse

## 2023-02-19 NOTE — ED Provider Notes (Incomplete)
Donald [name] Provider Note   CSN: [id number] Arrival date & time: 02/19/23  2041     History {Add pertinent medical, surgical, social history, OB history to HPI:1} Chief Complaint  Patient presents with  . Abdominal Pain    Donald [name] is a 42 y.o. male.  Patient with past medical history significant for sleeve gastrectomy, hypertension, prediabetes, GERD presents to the emergency room complaining of generalized abdominal pain worse in epigastric region, nausea, and vomiting which began on Wednesday.  Patient also endorses onset of diarrhea.  He was seen at an urgent care and treated with Zofran.  The patient states that the Zofran did cut down on the vomiting but made him feel that there was something stuck in his throat.  He stopped taking Zofran in order to be able to vomit again.  He states that the Zofran did allow him to keep small amounts of fluids down but he has had no solid food since before onset of symptoms on Wednesday.  He does endorse diarrhea which stopped approximately 2 days ago.  He denies chest pain, shortness of breath, urinary symptoms, blood in stool.  He endorses a low-grade fever at home but is afebrile upon arrival.   Abdominal Pain      Home Medications Prior to Admission medications   Medication Sig Start Date End Date Taking? Authorizing Provider  allopurinol (ZYLOPRIM) 300 MG tablet Take 1 tablet (300 mg total) by mouth 2 (two) times daily. 05/30/22   Danelle Berry, PA-C  chlorhexidine (PERIDEX) 0.12 % solution SMARTSIG:0.5 Ounce(s) By Mouth Twice Daily 05/12/22   [provider]  colchicine 0.6 MG tablet Take two pills at onset of gout flare, followed by one pill one hour later; max of 3 pills per flare 08/04/22   Berniece Salines, FNP  gabapentin (NEURONTIN) 300 MG capsule Take 300 mg by mouth at bedtime. 09/30/22   [provider]  meloxicam (MOBIC) 15 MG tablet Take by mouth. 05/24/22    [provider]  methocarbamol (ROBAXIN) 500 MG tablet Take 500 mg by mouth at bedtime as needed. 05/24/22   [provider]  Multiple Vitamin (MULTIVITAMIN) tablet Take 1 tablet by mouth daily.    [provider]  ondansetron (ZOFRAN-ODT) 4 MG disintegrating tablet Take 1 tablet (4 mg total) by mouth every 6 (six) hours as needed for nausea or vomiting. 02/17/23   Rising, Lurena Joiner, PA-C  pantoprazole (PROTONIX) 40 MG tablet Take 1 tablet (40 mg total) by mouth daily. 05/30/22   Danelle Berry, PA-C      Allergies    Patient has no known allergies.    Review of Systems   Review of Systems  Gastrointestinal:  Positive for abdominal pain.    Physical Exam Updated Vital Signs BP 117/87   Pulse 79   Temp (!) 97.5 F (36.4 C)   Resp 17   Ht 6\' 5"  (1.956 m)   Wt 116.6 kg   SpO2 98%   BMI 30.48 kg/m  Physical Exam Vitals and nursing note reviewed.  Constitutional:      General: He is not in acute distress.    Appearance: He is well-developed.  HENT:     Head: Normocephalic and atraumatic.  Eyes:     Conjunctiva/sclera: Conjunctivae normal.  Cardiovascular:     Rate and Rhythm: Normal rate and regular rhythm.  Pulmonary:     Effort: Pulmonary effort is normal. No respiratory distress.  Breath sounds: Normal breath sounds.  Abdominal:     Palpations: Abdomen is soft.     Tenderness: There is generalized abdominal tenderness (Diffuse abdominal tenderness, worse in epigastric region) and tenderness in the epigastric area.  Musculoskeletal:        General: No swelling.     Cervical back: Neck supple.  Skin:    General: Skin is warm and dry.     Capillary Refill: Capillary refill takes less than 2 seconds.  Neurological:     Mental Status: He is alert.  Psychiatric:        Mood and Affect: Mood normal.     ED Results / Procedures / Treatments   Labs (all labs ordered are listed, but only abnormal results are displayed) Labs Reviewed  LIPASE,  BLOOD - Abnormal; Notable for the following components:      Result Value   Lipase 86 (*)    All other components within normal limits  COMPREHENSIVE METABOLIC PANEL - Abnormal; Notable for the following components:   Sodium 130 (*)    Chloride 97 (*)    CO2 20 (*)    Creatinine, Ser 1.52 (*)    GFR, Estimated 59 (*)    All other components within normal limits  CBC - Abnormal; Notable for the following components:   WBC 3.6 (*)    RBC 6.84 (*)    Hemoglobin 18.0 (*)    HCT 54.3 (*)    MCV 79.4 (*)    All other components within normal limits  URINALYSIS, ROUTINE W REFLEX MICROSCOPIC - Abnormal; Notable for the following components:   APPearance HAZY (*)    Hgb urine dipstick SMALL (*)    Ketones, ur 20 (*)    Protein, ur 30 (*)    Bacteria, UA FEW (*)    All other components within normal limits    EKG None  Radiology No results found.  Procedures Ultrasound ED Peripheral IV (Provider)  Date/Time: 02/19/2023 11:49 PM  Performed by: Darrick Grinder, PA-C Authorized by: Darrick Grinder, PA-C   Procedure details:    Indications: hydration     Skin Prep: chlorhexidine gluconate     Location:  Right AC   Angiocath:  20 G   Bedside Ultrasound Guided: Yes     Images: archived     Patient tolerated procedure without complications: Yes     Dressing applied: Yes     {Document cardiac monitor, telemetry assessment procedure when appropriate:1}  Medications Ordered in ED Medications  metoCLOPramide (REGLAN) injection 10 mg (has no administration in time range)    ED Course/ Medical Decision Making/ A&P   {   Click here for ABCD2, HEART and other calculatorsREFRESH Note before signing :1}                              Medical Decision Making Amount and/or Complexity of Data Reviewed Labs: ordered. Radiology: ordered.  Risk Prescription drug management.   This patient presents to the ED for concern of abdominal pain, nausea vomiting diarrhea, this involves  an extensive number of treatment options, and is a complaint that carries with it a high risk of complications and morbidity.  The differential diagnosis includes gastroenteritis, pancreatitis, appendicitis, cholecystitis, others   Co morbidities that complicate the patient evaluation  History of gastric sleeve, prediabetes, hypertension   Additional history obtained:  Additional history obtained from patient's wife at bedside External records from  outside source obtained and reviewed including urgent care notes   Lab Tests:  I Ordered, and personally interpreted labs.  The pertinent results include:  Lipase 86, grossly unremarkable CBC, creatinine 1.52 (1.31 2 months ago), UA with small hemoglobin, ketones, protein, few bacteria.   Imaging Studies ordered:  I ordered imaging studies including CT abdomen pelvis with contrast I independently visualized and interpreted imaging which showed *** I agree with the radiologist interpretation   Cardiac Monitoring: / EKG:  The patient was maintained on a cardiac monitor.  I personally viewed and interpreted the cardiac monitored which showed an underlying rhythm of: ***   Consultations Obtained:  I requested consultation with the ***,  and discussed lab and imaging findings as well as pertinent plan - they recommend: ***   Problem List / ED Course / Critical interventions / Medication management   I ordered medication including Reglan for abdominal pain/nausea/vomiting Reevaluation of the patient after these medicines showed that the patient {resolved/improved/worsened:23923::"improved"} I have reviewed the patients home medicines and have made adjustments as needed   Social Determinants of Health:  ***   Test / Admission - Considered:  ***   {Document critical care time when appropriate:1} {Document review of labs and clinical decision tools ie heart score, Chads2Vasc2 etc:1}  {Document your independent review of  radiology images, and any outside records:1} {Document your discussion with family members, caretakers, and with consultants:1} {Document social determinants of health affecting pt's care:1} {Document your decision making why or why not admission, treatments were needed:1} Final Clinical Impression(s) / ED Diagnoses Final diagnoses:  None    Rx / DC Orders ED Discharge Orders     None

## 2023-02-19 NOTE — ED Provider Notes (Signed)
[place] EMERGENCY DEPARTMENT AT [facility] Provider Note   CSN: [id number] Arrival date & time: 02/19/23  2041     History {Add pertinent medical, surgical, social history, OB history to HPI:1} Chief Complaint  Patient presents with   Abdominal Pain    Donald [name] is a 42 y.o. male.  Patient with past medical history significant for sleeve gastrectomy, hypertension, prediabetes, GERD presents to the emergency room complaining of generalized abdominal pain worse in epigastric region, nausea, and vomiting which began on Wednesday.  Patient also endorses onset of diarrhea.  He was seen at an urgent care and treated with Zofran.  The patient states that the Zofran did cut down on the vomiting but made him feel that there was something stuck in his throat.  He stopped taking Zofran in order to be able to vomit again.  He states that the Zofran did allow him to keep small amounts of fluids down but he has had no solid food since before onset of symptoms on Wednesday.  He does endorse diarrhea which stopped approximately 2 days ago.  He denies chest pain, shortness of breath, urinary symptoms, blood in stool.  He endorses a low-grade fever at home but is afebrile upon arrival.   Abdominal Pain      Home Medications Prior to Admission medications   Medication Sig Start Date End Date Taking? Authorizing Provider  allopurinol (ZYLOPRIM) 300 MG tablet Take 1 tablet (300 mg total) by mouth 2 (two) times daily. 05/30/22   Danelle Berry, PA-C  chlorhexidine (PERIDEX) 0.12 % solution SMARTSIG:0.5 Ounce(s) By Mouth Twice Daily 05/12/22   [provider]  colchicine 0.6 MG tablet Take two pills at onset of gout flare, followed by one pill one hour later; max of 3 pills per flare 08/04/22   Berniece Salines, FNP  gabapentin (NEURONTIN) 300 MG capsule Take 300 mg by mouth at bedtime. 09/30/22   [provider]  meloxicam (MOBIC) 15 MG tablet Take by mouth. 05/24/22    [provider]  methocarbamol (ROBAXIN) 500 MG tablet Take 500 mg by mouth at bedtime as needed. 05/24/22   [provider]  Multiple Vitamin (MULTIVITAMIN) tablet Take 1 tablet by mouth daily.    [provider]  ondansetron (ZOFRAN-ODT) 4 MG disintegrating tablet Take 1 tablet (4 mg total) by mouth every 6 (six) hours as needed for nausea or vomiting. 02/17/23   Rising, Lurena Joiner, PA-C  pantoprazole (PROTONIX) 40 MG tablet Take 1 tablet (40 mg total) by mouth daily. 05/30/22   Danelle Berry, PA-C      Allergies    Patient has no known allergies.    Review of Systems   Review of Systems  Gastrointestinal:  Positive for abdominal pain.    Physical Exam Updated Vital Signs BP 117/87   Pulse 79   Temp (!) 97.5 F (36.4 C)   Resp 17   Ht 6\' 5"  (1.956 m)   Wt 116.6 kg   SpO2 98%   BMI 30.48 kg/m  Physical Exam Vitals and nursing note reviewed.  Constitutional:      General: He is not in acute distress.    Appearance: He is well-developed.  HENT:     Head: Normocephalic and atraumatic.  Eyes:     Conjunctiva/sclera: Conjunctivae normal.  Cardiovascular:     Rate and Rhythm: Normal rate and regular rhythm.  Pulmonary:     Effort: Pulmonary effort is normal. No respiratory distress.  Breath sounds: Normal breath sounds.  Abdominal:     Palpations: Abdomen is soft.     Tenderness: There is generalized abdominal tenderness (Diffuse abdominal tenderness, worse in epigastric region) and tenderness in the epigastric area.  Musculoskeletal:        General: No swelling.     Cervical back: Neck supple.  Skin:    General: Skin is warm and dry.     Capillary Refill: Capillary refill takes less than 2 seconds.  Neurological:     Mental Status: He is alert.  Psychiatric:        Mood and Affect: Mood normal.     ED Results / Procedures / Treatments   Labs (all labs ordered are listed, but only abnormal results are displayed) Labs Reviewed  LIPASE,  BLOOD - Abnormal; Notable for the following components:      Result Value   Lipase 86 (*)    All other components within normal limits  COMPREHENSIVE METABOLIC PANEL - Abnormal; Notable for the following components:   Sodium 130 (*)    Chloride 97 (*)    CO2 20 (*)    Creatinine, Ser 1.52 (*)    GFR, Estimated 59 (*)    All other components within normal limits  CBC - Abnormal; Notable for the following components:   WBC 3.6 (*)    RBC 6.84 (*)    Hemoglobin 18.0 (*)    HCT 54.3 (*)    MCV 79.4 (*)    All other components within normal limits  URINALYSIS, ROUTINE W REFLEX MICROSCOPIC - Abnormal; Notable for the following components:   APPearance HAZY (*)    Hgb urine dipstick SMALL (*)    Ketones, ur 20 (*)    Protein, ur 30 (*)    Bacteria, UA FEW (*)    All other components within normal limits    EKG None  Radiology No results found.  Procedures Procedures  {Document cardiac monitor, telemetry assessment procedure when appropriate:1}  Medications Ordered in ED Medications  metoCLOPramide (REGLAN) injection 10 mg (has no administration in time range)    ED Course/ Medical Decision Making/ A&P   {   Click here for ABCD2, HEART and other calculatorsREFRESH Note before signing :1}                              Medical Decision Making Amount and/or Complexity of Data Reviewed Labs: ordered. Radiology: ordered.  Risk Prescription drug management.   This patient presents to the ED for concern of abdominal pain, nausea vomiting diarrhea, this involves an extensive number of treatment options, and is a complaint that carries with it a high risk of complications and morbidity.  The differential diagnosis includes gastroenteritis, pancreatitis, appendicitis, cholecystitis, others   Co morbidities that complicate the patient evaluation  History of gastric sleeve, prediabetes, hypertension   Additional history obtained:  Additional history obtained from  patient's wife at bedside External records from outside source obtained and reviewed including urgent care notes   Lab Tests:  I Ordered, and personally interpreted labs.  The pertinent results include:  Lipase 86, grossly unremarkable CBC, creatinine 1.52 (1.31 2 months ago), UA with small hemoglobin, ketones, protein, few bacteria.   Imaging Studies ordered:  I ordered imaging studies including CT abdomen pelvis with contrast I independently visualized and interpreted imaging which showed *** I agree with the radiologist interpretation   Cardiac Monitoring: / EKG:  The patient  was maintained on a cardiac monitor.  I personally viewed and interpreted the cardiac monitored which showed an underlying rhythm of: ***   Consultations Obtained:  I requested consultation with the ***,  and discussed lab and imaging findings as well as pertinent plan - they recommend: ***   Problem List / ED Course / Critical interventions / Medication management   I ordered medication including Reglan for abdominal pain/nausea/vomiting Reevaluation of the patient after these medicines showed that the patient {resolved/improved/worsened:23923::"improved"} I have reviewed the patients home medicines and have made adjustments as needed   Social Determinants of Health:  ***   Test / Admission - Considered:  ***   {Document critical care time when appropriate:1} {Document review of labs and clinical decision tools ie heart score, Chads2Vasc2 etc:1}  {Document your independent review of radiology images, and any outside records:1} {Document your discussion with family members, caretakers, and with consultants:1} {Document social determinants of health affecting pt's care:1} {Document your decision making why or why not admission, treatments were needed:1} Final Clinical Impression(s) / ED Diagnoses Final diagnoses:  None    Rx / DC Orders ED Discharge Orders     None

## 2023-02-20 ENCOUNTER — Emergency Department (HOSPITAL_COMMUNITY): Payer: Commercial Managed Care - PPO

## 2023-02-20 MED ORDER — IOHEXOL 350 MG/ML SOLN
75.0000 mL | Freq: Once | INTRAVENOUS | Status: AC | PRN
  Administered 2023-02-20: 75 mL via INTRAVENOUS

## 2023-02-20 MED ORDER — METOCLOPRAMIDE HCL 10 MG PO TABS: 10.0000 mg | ORAL_TABLET | Freq: Four times a day (QID) | ORAL | 0 refills | Status: DC

## 2023-02-20 NOTE — Discharge Instructions (Addendum)
Your workup today was overall reassuring.  You did have a mild elevation in your lipase levels.  I recommend follow-up with primary care for repeat labs to check for resolution of symptoms and for further evaluation as needed.  If you develop any life-threatening symptoms return to the emergency department.  Take the prescribed Reglan as directed.

## 2023-06-09 ENCOUNTER — Telehealth: Payer: Commercial Managed Care - PPO | Admitting: Emergency Medicine

## 2023-06-09 ENCOUNTER — Telehealth: Payer: Commercial Managed Care - PPO

## 2023-06-09 DIAGNOSIS — R112 Nausea with vomiting, unspecified: Secondary | ICD-10-CM

## 2023-06-09 DIAGNOSIS — R197 Diarrhea, unspecified: Secondary | ICD-10-CM

## 2023-06-09 MED ORDER — METOCLOPRAMIDE HCL 10 MG PO TABS: 10.0000 mg | ORAL_TABLET | Freq: Four times a day (QID) | ORAL | 0 refills | Status: DC

## 2023-06-09 NOTE — Progress Notes (Signed)
Virtual Visit Consent   Donald [name], you are scheduled for a virtual visit with a [facility] provider today. Just as with appointments in the office, your consent must be obtained to participate. Your consent will be active for this visit and any virtual visit you may have with one of our providers in the next 365 days. If you have a MyChart account, a copy of this consent can be sent to you electronically.  As this is a virtual visit, video technology does not allow for your provider to perform a traditional examination. This may limit your provider's ability to fully assess your condition. If your provider identifies any concerns that need to be evaluated in person or the need to arrange testing (such as labs, EKG, etc.), we will make arrangements to do so. Although advances in technology are sophisticated, we cannot ensure that it will always work on either your end or our end. If the connection with a video visit is poor, the visit may have to be switched to a telephone visit. With either a video or telephone visit, we are not always able to ensure that we have a secure connection.  By engaging in this virtual visit, you consent to the provision of healthcare and authorize for your insurance to be billed (if applicable) for the services provided during this visit. Depending on your insurance coverage, you may receive a charge related to this service.  I need to obtain your verbal consent now. Are you willing to proceed with your visit today? Donald [name] has provided verbal consent on 06/09/2023 for a virtual visit (video or telephone). Donald Horseman, PA-C  Date: 06/09/2023 3:46 PM   Virtual Visit via Video Note   I, Donald [name], connected with  Donald [name]  ([id number], September 19, 1980) on 06/09/23 at  3:45 PM EST by a video-enabled telemedicine application and verified that I am speaking with the correct person using two identifiers.  Location: Patient: Virtual Visit  Location Patient: Home Provider: Virtual Visit Location Provider: Home Office   I discussed the limitations of evaluation and management by telemedicine and the availability of in person appointments. The patient expressed understanding and agreed to proceed.    History of Present Illness: Donald [name] is a 43 y.o. who identifies as a male who was assigned male at birth, and is being seen today for nausea and vomiting.  States that he has had associated diarrhea.  States that symptoms started yesterday.  Has tried Zofran before, but didn't feel well with it.  States that Reglan has worked well in the past.  Denies known sick contacts.  HPI: HPI  Problems:  Patient Active Problem List   Diagnosis Date Noted   Low back pain 05/30/2022   Gastroesophageal reflux disease with esophagitis 09/20/2021   Folate deficiency 12/02/2020   S/P laparoscopic sleeve gastrectomy 03/21/2016   Family hx of prostate cancer 09/10/2015   Class 1 obesity with body mass index (BMI) of 30.0 to 30.9 in adult 02/20/2015   Pre-diabetes 02/20/2015   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 tophus 02/20/2015   Hyperlipidemia LDL goal <100 01/27/2015    Allergies: No Known Allergies Medications:  Current Outpatient Medications:    allopurinol (ZYLOPRIM) 300 MG tablet, Take 1 tablet (300 mg total) by mouth 2 (two) times daily., Disp: 180 tablet, Rfl: 3   chlorhexidine (PERIDEX) 0.12 % solution, SMARTSIG:0.5 Ounce(s) By Mouth Twice Daily, Disp: , Rfl:    colchicine 0.6 MG tablet, Take two  pills at onset of gout flare, followed by one pill one hour later; max of 3 pills per flare, Disp: 10 tablet, Rfl: 3   gabapentin (NEURONTIN) 300 MG capsule, Take 300 mg by mouth at bedtime., Disp: , Rfl:    meloxicam (MOBIC) 15 MG tablet, Take by mouth., Disp: , Rfl:    methocarbamol (ROBAXIN) 500 MG tablet, Take 500 mg by mouth at bedtime as needed., Disp: , Rfl:    Multiple Vitamin (MULTIVITAMIN) tablet, Take 1  tablet by mouth daily., Disp: , Rfl:    ondansetron (ZOFRAN-ODT) 4 MG disintegrating tablet, Take 1 tablet (4 mg total) by mouth every 6 (six) hours as needed for nausea or vomiting., Disp: 20 tablet, Rfl: 0   pantoprazole (PROTONIX) 40 MG tablet, Take 1 tablet (40 mg total) by mouth daily., Disp: 30 tablet, Rfl: 11  Observations/Objective: Patient is well-developed, well-nourished in no acute distress.  Resting comfortably at home.  Head is normocephalic, atraumatic.  No labored breathing.  Speech is clear and coherent with logical content.  Patient is alert and oriented at baseline.    Assessment and Plan: 1. Nausea vomiting and diarrhea (Primary)  Meds ordered this encounter  Medications   metoCLOPramide (REGLAN) 10 MG tablet    Sig: Take 1 tablet (10 mg total) by mouth 4 (four) times daily.    Dispense:  20 tablet    Refill:  0    Supervising Provider:   Merrilee Jansky [id number]   Symptoms sound consistent with norovirus.  Supportive care and return precautions discussed.  Follow Up Instructions: I discussed the assessment and treatment plan with the patient. The patient was provided an opportunity to ask questions and all were answered. The patient agreed with the plan and demonstrated an understanding of the instructions.  A copy of instructions were sent to the patient via MyChart unless otherwise noted below.     The patient was advised to call back or seek an in-person evaluation if the symptoms worsen or if the condition fails to improve as anticipated.    Donald Horseman, PA-C

## 2023-06-09 NOTE — Patient Instructions (Signed)
  Donald [name], thank you for joining Roxy Horseman, PA-C for today's virtual visit.  While this provider is not your primary care provider (PCP), if your PCP is located in our provider database this encounter information will be shared with them immediately following your visit.   A [facility] MyChart account gives you access to today's visit and all your visits, tests, and labs performed at [facility] " click here if you don't have a [facility] MyChart account or go to [email or web address]  Consent: (Patient) Donald [name] provided verbal consent for this virtual visit at the beginning of the encounter.  Current Medications:  Current Outpatient Medications:    metoCLOPramide (REGLAN) 10 MG tablet, Take 1 tablet (10 mg total) by mouth 4 (four) times daily., Disp: 20 tablet, Rfl: 0   allopurinol (ZYLOPRIM) 300 MG tablet, Take 1 tablet (300 mg total) by mouth 2 (two) times daily., Disp: 180 tablet, Rfl: 3   chlorhexidine (PERIDEX) 0.12 % solution, SMARTSIG:0.5 Ounce(s) By Mouth Twice Daily, Disp: , Rfl:    colchicine 0.6 MG tablet, Take two pills at onset of gout flare, followed by one pill one hour later; max of 3 pills per flare, Disp: 10 tablet, Rfl: 3   gabapentin (NEURONTIN) 300 MG capsule, Take 300 mg by mouth at bedtime., Disp: , Rfl:    meloxicam (MOBIC) 15 MG tablet, Take by mouth., Disp: , Rfl:    methocarbamol (ROBAXIN) 500 MG tablet, Take 500 mg by mouth at bedtime as needed., Disp: , Rfl:    Multiple Vitamin (MULTIVITAMIN) tablet, Take 1 tablet by mouth daily., Disp: , Rfl:    ondansetron (ZOFRAN-ODT) 4 MG disintegrating tablet, Take 1 tablet (4 mg total) by mouth every 6 (six) hours as needed for nausea or vomiting., Disp: 20 tablet, Rfl: 0   pantoprazole (PROTONIX) 40 MG tablet, Take 1 tablet (40 mg total) by mouth daily., Disp: 30 tablet, Rfl: 11   Medications ordered in this encounter:  Meds ordered this encounter  Medications   metoCLOPramide  (REGLAN) 10 MG tablet    Sig: Take 1 tablet (10 mg total) by mouth 4 (four) times daily.    Dispense:  20 tablet    Refill:  0    Supervising Provider:   Merrilee Jansky [[id number]]     *If you need refills on other medications prior to your next appointment, please contact your pharmacy*  Follow-Up: Call back or seek an in-person evaluation if the symptoms worsen or if the condition fails to improve as anticipated.  [facility] [phone]  Other Instructions    If you have been instructed to have an in-person evaluation today at a local Urgent Care facility, please use the link below. It will take you to a list of all of our available [facility], including address, phone number and hours of operation. Please do not delay care.  [facility]  If you or a family member do not have a primary care provider, use the link below to schedule a visit and establish care. When you choose a [facility] primary care physician or advanced practice provider, you gain a long-term partner in health. Find a Primary Care Provider  Learn more about [facility]'s in-office and virtual care options:  - Get Care Now

## 2023-06-20 ENCOUNTER — Encounter: Payer: Self-pay | Admitting: Family Medicine

## 2023-06-21 NOTE — Progress Notes (Unsigned)
 Name: Donald [name]   MRN: [id number]    DOB: Oct 16, 1980   Date:06/22/2023       Progress Note  Chief Complaint  Patient presents with   Gout    FMLA paperwork     Subjective:   Donald [name] is a 43 y.o. male, presents to clinic for routine follow up on chronic conditions   Gout: Sent mychart msg asking for [facility] recertification for gout flares - upon showing up with the paperwork he actually didn't need any paperwork done.  He does need refills. Reports gout flares often.  Lab Results  Component Value Date   LABURIC 7.5 12/08/2022   On allopurinol 300 mg BID and last acute flare provider gave him colchicine prn Last OV routine for gout was April 2024 No other flares or OV noted in this EMR Colchicine works much better for him than indomethacin Left 1st MTP joint always affected  Prediabetes - last A1C back in prediabetic range  GI sx and concerns - he would like GI consult He reports multiple and more frequent GI upset episodes which he says are viruses, pain cramping N/V/D making him sick for several days.  He had ED visit in nov with these sx.  He was told he may have pancreatitis.  He did not f/up after that He notes no specific food that "trigger" sx, but he can feel it "coming on" after eating.  Last two episodes happened after eating [place] food and another time chicken tenders Normal bowel hx for him is 3 x a day loose to watery Hx of sleeve gastrectomy  Also hx of GERD, he takes PPI intermittently when sx flare up, but he does not take daily He denies ETOH or NSAID use.  Last ETOH was on vacation a few months ago, none since.  ER visit labs, notes and imaging reviewed today    Current Outpatient Medications:    allopurinol (ZYLOPRIM) 300 MG tablet, Take 1 tablet (300 mg total) by mouth 2 (two) times daily., Disp: 180 tablet, Rfl: 3   chlorhexidine (PERIDEX) 0.12 % solution, SMARTSIG:0.5 Ounce(s) By Mouth Twice Daily, Disp: , Rfl:    colchicine 0.6 MG  tablet, Take two pills at onset of gout flare, followed by one pill one hour later; max of 3 pills per flare, Disp: 10 tablet, Rfl: 3   Multiple Vitamin (MULTIVITAMIN) tablet, Take 1 tablet by mouth daily., Disp: , Rfl:    pantoprazole (PROTONIX) 40 MG tablet, Take 1 tablet (40 mg total) by mouth daily., Disp: 30 tablet, Rfl: 11   gabapentin (NEURONTIN) 300 MG capsule, Take 300 mg by mouth at bedtime. (Patient not taking: Reported on 06/22/2023), Disp: , Rfl:    meloxicam (MOBIC) 15 MG tablet, Take by mouth. (Patient not taking: Reported on 06/22/2023), Disp: , Rfl:    methocarbamol (ROBAXIN) 500 MG tablet, Take 500 mg by mouth at bedtime as needed. (Patient not taking: Reported on 06/22/2023), Disp: , Rfl:    metoCLOPramide (REGLAN) 10 MG tablet, Take 1 tablet (10 mg total) by mouth 4 (four) times daily. (Patient not taking: Reported on 06/22/2023), Disp: 20 tablet, Rfl: 0   ondansetron (ZOFRAN-ODT) 4 MG disintegrating tablet, Take 1 tablet (4 mg total) by mouth every 6 (six) hours as needed for nausea or vomiting. (Patient not taking: Reported on 06/22/2023), Disp: 20 tablet, Rfl: 0  Patient Active Problem List   Diagnosis Date Noted   Low back pain 05/30/2022   Gastroesophageal reflux disease with esophagitis  09/20/2021   Folate deficiency 12/02/2020   S/P laparoscopic sleeve gastrectomy 03/21/2016   Family hx of prostate cancer 09/10/2015   Class 1 obesity with body mass index (BMI) of 30.0 to 30.9 in adult 02/20/2015   Pre-diabetes 02/20/2015   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 tophus 02/20/2015   Hyperlipidemia LDL goal <100 01/27/2015    Past Surgical History:  Procedure Laterality Date   LAPAROSCOPIC GASTRIC SLEEVE RESECTION N/A 03/21/2016   Procedure: LAPAROSCOPIC GASTRIC SLEEVE RESECTION, UPPER ENDO;  Surgeon: Gaynelle Adu, MD;  Location: [facility];  Service: General;  Laterality: N/A;   WISDOM TOOTH EXTRACTION      Family History  Problem Relation Age of Onset    Hyperlipidemia Mother    Diabetes Mother    Diabetes Father    Prostate cancer Father    Prostate cancer Paternal Grandfather     Social History   Tobacco Use   Smoking status: Former    Current packs/day: 0.00    Average packs/day: 0.3 packs/day for 10.0 years (3.0 ttl pk-yrs)    Types: Cigarettes    Start date: 2008    Quit date: 2018    Years since quitting: 7.1   Smokeless tobacco: Never   Tobacco comments:    smokes at social events rarely  Vaping Use   Vaping status: Never Used  Substance Use Topics   Alcohol use: Yes    Alcohol/week: 0.0 - 1.0 standard drinks of alcohol    Comment: socially   Drug use: No     No Known Allergies  Health Maintenance  Topic Date Due   INFLUENZA VACCINE  11/17/2022   COVID-19 Vaccine (4 - 2024-25 season) 12/18/2022   DTaP/Tdap/Td (3 - Td or Tdap) 08/05/2027   Hepatitis C Screening  Completed   HIV Screening  Completed   HPV VACCINES  Aged Out    Chart Review Today: I personally reviewed active problem list, medication list, allergies, family history, social history, health maintenance, notes from last encounter, lab results, imaging with the patient/caregiver today.   Review of Systems  Constitutional: Negative.   HENT: Negative.    Eyes: Negative.   Respiratory: Negative.    Cardiovascular: Negative.   Gastrointestinal: Negative.   Endocrine: Negative.   Genitourinary: Negative.   Musculoskeletal: Negative.   Skin: Negative.   Allergic/Immunologic: Negative.   Neurological: Negative.   Hematological: Negative.   Psychiatric/Behavioral: Negative.    All other systems reviewed and are negative.    Objective:   Vitals:   06/22/23 1126  Pulse: 86  Resp: 18  Temp: 98.6 F (37 C)  TempSrc: Oral  SpO2: 97%  Weight: 255 lb 8 oz (115.9 kg)    Body mass index is 30.3 kg/m.  Physical Exam Vitals and nursing note reviewed.  Constitutional:      General: He is not in acute distress.    Appearance: Normal  appearance. He is well-developed. He is not ill-appearing, toxic-appearing or diaphoretic.  HENT:     Head: Normocephalic and atraumatic.     Nose: Nose normal.  Eyes:     General: No scleral icterus.       Right eye: No discharge.        Left eye: No discharge.     Conjunctiva/sclera: Conjunctivae normal.  Neck:     Trachea: No tracheal deviation.  Cardiovascular:     Rate and Rhythm: Normal rate and regular rhythm.     Pulses: Normal pulses.     Heart  sounds: Normal heart sounds.  Pulmonary:     Effort: Pulmonary effort is normal. No respiratory distress.     Breath sounds: Normal breath sounds. No stridor. No wheezing, rhonchi or rales.  Abdominal:     General: Bowel sounds are normal. There is no distension.     Palpations: Abdomen is soft.     Tenderness: There is no abdominal tenderness. There is no right CVA tenderness, left CVA tenderness, guarding or rebound.     Hernia: No hernia is present.  Skin:    General: Skin is warm and dry.     Findings: No rash.  Neurological:     Mental Status: He is alert. Mental status is at baseline.     Motor: No abnormal muscle tone.     Coordination: Coordination normal.  Psychiatric:        Behavior: Behavior normal.      Functional Status Survey: Is the patient deaf or have difficulty hearing?: No Does the patient have difficulty seeing, even when wearing glasses/contacts?: No Does the patient have difficulty concentrating, remembering, or making decisions?: No Does the patient have difficulty walking or climbing stairs?: No Does the patient have difficulty dressing or bathing?: No Does the patient have difficulty doing errands alone such as visiting a doctor's office or shopping?: No  Results for orders placed or performed during the hospital encounter of 02/19/23  Lipase, blood   Collection Time: 02/19/23  9:07 PM  Result Value Ref Range   Lipase 86 (H) 11 - 51 U/L  Comprehensive metabolic panel   Collection Time:  02/19/23  9:07 PM  Result Value Ref Range   Sodium 130 (L) 135 - 145 mmol/L   Potassium 3.6 3.5 - 5.1 mmol/L   Chloride 97 (L) 98 - 111 mmol/L   CO2 20 (L) 22 - 32 mmol/L   Glucose, Bld 98 70 - 99 mg/dL   BUN 19 6 - 20 mg/dL   Creatinine, Ser 1.61 (H) 0.61 - 1.24 mg/dL   Calcium 8.9 8.9 - 09.6 mg/dL   Total Protein 7.0 6.5 - 8.1 g/dL   Albumin 3.5 3.5 - 5.0 g/dL   AST 27 15 - 41 U/L   ALT 21 0 - 44 U/L   Alkaline Phosphatase 54 38 - 126 U/L   Total Bilirubin 0.7 0.3 - 1.2 mg/dL   GFR, Estimated 59 (L) >60 mL/min   Anion gap 13 5 - 15  CBC   Collection Time: 02/19/23  9:07 PM  Result Value Ref Range   WBC 3.6 (L) 4.0 - 10.5 K/uL   RBC 6.84 (H) 4.22 - 5.81 MIL/uL   Hemoglobin 18.0 (H) 13.0 - 17.0 g/dL   HCT 04.5 (H) 40.9 - 81.1 %   MCV 79.4 (L) 80.0 - 100.0 fL   MCH 26.3 26.0 - 34.0 pg   MCHC 33.1 30.0 - 36.0 g/dL   RDW 91.4 78.2 - 95.6 %   Platelets 205 150 - 400 K/uL   nRBC 0.0 0.0 - 0.2 %  Urinalysis, Routine w reflex microscopic -Urine, Clean Catch   Collection Time: 02/19/23  9:07 PM  Result Value Ref Range   Color, Urine YELLOW YELLOW   APPearance HAZY (A) CLEAR   Specific Gravity, Urine 1.030 1.005 - 1.030   pH 5.0 5.0 - 8.0   Glucose, UA NEGATIVE NEGATIVE mg/dL   Hgb urine dipstick SMALL (A) NEGATIVE   Bilirubin Urine NEGATIVE NEGATIVE   Ketones, ur 20 (A) NEGATIVE mg/dL   Protein,  ur 30 (A) NEGATIVE mg/dL   Nitrite NEGATIVE NEGATIVE   Leukocytes,Ua NEGATIVE NEGATIVE   RBC / HPF 0-5 0 - 5 RBC/hpf   WBC, UA 0-5 0 - 5 WBC/hpf   Bacteria, UA FEW (A) NONE SEEN   Squamous Epithelial / HPF 0-5 0 - 5 /HPF   Mucus PRESENT    Hyaline Casts, UA PRESENT       Assessment & Plan:    Problem List Items Addressed This Visit     Hyperlipidemia LDL goal <100 (Chronic)   Lipids elevated, not currently on meds, reviewed his last labs Lab Results  Component Value Date   CHOL 204 (H) 12/08/2022   HDL 70 12/08/2022   LDLCALC 114 (H) 12/08/2022   TRIG 97 12/08/2022    CHOLHDL 2.9 12/08/2022  The 10-year ASCVD risk score (Arnett DK, et al., 2019) is: 2.7%   Values used to calculate the score:     Age: 22 years     Sex: Male     Is Non-Hispanic African American: Yes     Diabetic: No     Tobacco smoker: No     Systolic Blood Pressure: 118 mmHg     Is BP treated: No     HDL Cholesterol: 70 mg/dL     Total Cholesterol: 204 mg/dL        Relevant Orders   COMPLETE METABOLIC PANEL WITH GFR   Pre-diabetes (Chronic)   Labs had returned to normal range for several years after weight loss surgery, however last A1C 5.7 Recheck today      Relevant Orders   COMPLETE METABOLIC PANEL WITH GFR   Hemoglobin A1c   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 tophus - Primary   Not well controlled, he reports frequent sx needing to take meds or take a day or two off at least monthly Recheck uric acid Colchicine works much better for him than other meds in the past Refilled colchicine Will refill allopurinol after labs reviewed      Relevant Medications   colchicine 0.6 MG tablet   Other Relevant Orders   Uric acid   Lipase   S/P laparoscopic sleeve gastrectomy   Relevant Orders   Ambulatory referral to Gastroenterology   Gastroesophageal reflux disease with esophagitis   He managed with PPI use only intermittently, not taking daily S/p gastric sleeve, no past GI consult or EGD      Relevant Medications   pantoprazole (PROTONIX) 40 MG tablet   Other Relevant Orders   Ambulatory referral to Gastroenterology   Other Visit Diagnoses       Nausea vomiting and diarrhea       recurrent episodes of illness, hes not sure if its viral illnesses or something else wrong with GI system, more frequent   Relevant Orders   COMPLETE METABOLIC PANEL WITH GFR   Lipase   Ambulatory referral to Gastroenterology     Encounter for examination following treatment at hospital       ER encounter thoroughly reviewed, labs, notes, CT abd/pelvis results    Relevant Orders   COMPLETE METABOLIC PANEL WITH GFR   CBC with Differential/Platelet   Lipase     Chronic diarrhea       BM 3+ daily bristol stool 4-7   Relevant Orders   COMPLETE METABOLIC PANEL WITH GFR   Ambulatory referral to Gastroenterology     Elevated lipase       with prior ED visit, no current ETOH, recheck labs  Relevant Orders   COMPLETE METABOLIC PANEL WITH GFR   Lipase   Ambulatory referral to Gastroenterology         Return in about 6 months (around 12/23/2023) for Annual Physical.   Danelle Berry, PA-C 06/22/23 11:27 AM

## 2023-06-22 ENCOUNTER — Other Ambulatory Visit: Payer: Self-pay

## 2023-06-22 ENCOUNTER — Ambulatory Visit (INDEPENDENT_AMBULATORY_CARE_PROVIDER_SITE_OTHER): Admitting: Family Medicine

## 2023-06-22 ENCOUNTER — Encounter: Payer: Self-pay | Admitting: Family Medicine

## 2023-06-22 VITALS — BP 118/74 | HR 86 | Temp 98.6°F | Resp 18 | Ht 77.0 in | Wt 255.5 lb

## 2023-06-22 DIAGNOSIS — M1A09X0 Idiopathic chronic gout, multiple sites, without tophus (tophi): Secondary | ICD-10-CM

## 2023-06-22 DIAGNOSIS — K529 Noninfective gastroenteritis and colitis, unspecified: Secondary | ICD-10-CM

## 2023-06-22 DIAGNOSIS — E538 Deficiency of other specified B group vitamins: Secondary | ICD-10-CM

## 2023-06-22 DIAGNOSIS — R748 Abnormal levels of other serum enzymes: Secondary | ICD-10-CM

## 2023-06-22 DIAGNOSIS — R7303 Prediabetes: Secondary | ICD-10-CM

## 2023-06-22 DIAGNOSIS — K2100 Gastro-esophageal reflux disease with esophagitis, without bleeding: Secondary | ICD-10-CM

## 2023-06-22 DIAGNOSIS — Z9884 Bariatric surgery status: Secondary | ICD-10-CM

## 2023-06-22 DIAGNOSIS — E785 Hyperlipidemia, unspecified: Secondary | ICD-10-CM | POA: Diagnosis not present

## 2023-06-22 DIAGNOSIS — Z0289 Encounter for other administrative examinations: Secondary | ICD-10-CM

## 2023-06-22 DIAGNOSIS — Z09 Encounter for follow-up examination after completed treatment for conditions other than malignant neoplasm: Secondary | ICD-10-CM

## 2023-06-22 DIAGNOSIS — R112 Nausea with vomiting, unspecified: Secondary | ICD-10-CM

## 2023-06-22 MED ORDER — PANTOPRAZOLE SODIUM 40 MG PO TBEC: 40.0000 mg | DELAYED_RELEASE_TABLET | Freq: Every day | ORAL | 2 refills | Status: AC

## 2023-06-22 MED ORDER — COLCHICINE 0.6 MG PO TABS: ORAL_TABLET | ORAL | 5 refills | Status: AC

## 2023-06-22 NOTE — Assessment & Plan Note (Signed)
 Lipids elevated, not currently on meds, reviewed his last labs Lab Results  Component Value Date   CHOL 204 (H) 12/08/2022   HDL 70 12/08/2022   LDLCALC 114 (H) 12/08/2022   TRIG 97 12/08/2022   CHOLHDL 2.9 12/08/2022  The 10-year ASCVD risk score (Arnett DK, et al., 2019) is: 2.7%   Values used to calculate the score:     Age: 43 years     Sex: Male     Is Non-Hispanic African American: Yes     Diabetic: No     Tobacco smoker: No     Systolic Blood Pressure: 118 mmHg     Is BP treated: No     HDL Cholesterol: 70 mg/dL     Total Cholesterol: 204 mg/dL

## 2023-06-22 NOTE — Assessment & Plan Note (Signed)
 Labs had returned to normal range for several years after weight loss surgery, however last A1C 5.7 Recheck today

## 2023-06-22 NOTE — Assessment & Plan Note (Addendum)
 Not well controlled, he reports frequent sx needing to take meds or take a day or two off at least monthly Recheck uric acid Colchicine works much better for him than other meds in the past Refilled colchicine Will refill allopurinol after labs reviewed

## 2023-06-22 NOTE — Assessment & Plan Note (Signed)
 He managed with PPI use only intermittently, not taking daily S/p gastric sleeve, no past GI consult or EGD

## 2023-06-23 ENCOUNTER — Encounter: Payer: Self-pay | Admitting: Family Medicine

## 2023-06-23 ENCOUNTER — Other Ambulatory Visit: Payer: Self-pay | Admitting: Family Medicine

## 2023-06-23 DIAGNOSIS — M109 Gout, unspecified: Secondary | ICD-10-CM

## 2023-06-23 LAB — COMPLETE METABOLIC PANEL WITH GFR
AG Ratio: 1.6 (calc) (ref 1.0–2.5)
ALT: 18 U/L (ref 9–46)
AST: 14 U/L (ref 10–40)
Albumin: 4.3 g/dL (ref 3.6–5.1)
Alkaline phosphatase (APISO): 60 U/L (ref 36–130)
BUN: 16 mg/dL (ref 7–25)
CO2: 29 mmol/L (ref 20–32)
Calcium: 9.7 mg/dL (ref 8.6–10.3)
Chloride: 103 mmol/L (ref 98–110)
Creat: 1.23 mg/dL (ref 0.60–1.29)
Globulin: 2.7 g/dL (ref 1.9–3.7)
Glucose, Bld: 79 mg/dL (ref 65–99)
Potassium: 4.7 mmol/L (ref 3.5–5.3)
Sodium: 138 mmol/L (ref 135–146)
Total Bilirubin: 0.4 mg/dL (ref 0.2–1.2)
Total Protein: 7 g/dL (ref 6.1–8.1)
eGFR: 75 mL/min/{1.73_m2} (ref >=60)

## 2023-06-23 LAB — URIC ACID: Uric Acid, Serum: 7.8 mg/dL (ref 4.0–8.0)

## 2023-06-23 LAB — CBC WITH DIFFERENTIAL/PLATELET
Absolute Lymphocytes: 1451 {cells}/uL (ref 850–3900)
Absolute Monocytes: 291 {cells}/uL (ref 200–950)
Basophils Absolute: 29 {cells}/uL (ref 0–200)
Basophils Relative: 0.7 %
Eosinophils Absolute: 29 {cells}/uL (ref 15–500)
Eosinophils Relative: 0.7 %
HCT: 49.1 % (ref 38.5–50.0)
Hemoglobin: 15.9 g/dL (ref 13.2–17.1)
MCH: 26.9 pg — ABNORMAL LOW (ref 27.0–33.0)
MCHC: 32.4 g/dL (ref 32.0–36.0)
MCV: 83.1 fL (ref 80.0–100.0)
MPV: 11.6 fL (ref 7.5–12.5)
Monocytes Relative: 7.1 %
Neutro Abs: 2300 {cells}/uL (ref 1500–7800)
Neutrophils Relative %: 56.1 %
Platelets: 226 10*3/uL (ref 140–400)
RBC: 5.91 10*6/uL — ABNORMAL HIGH (ref 4.20–5.80)
RDW: 14.2 % (ref 11.0–15.0)
Total Lymphocyte: 35.4 %
WBC: 4.1 10*3/uL (ref 3.8–10.8)

## 2023-06-23 LAB — HEMOGLOBIN A1C
Hgb A1c MFr Bld: 5.7 %{Hb} — ABNORMAL HIGH (ref <5.7)
Mean Plasma Glucose: 117 mg/dL
eAG (mmol/L): 6.5 mmol/L

## 2023-06-23 LAB — LIPASE: Lipase: 114 U/L — ABNORMAL HIGH (ref 7–60)

## 2023-06-23 MED ORDER — ALLOPURINOL 300 MG PO TABS: 300.0000 mg | ORAL_TABLET | Freq: Two times a day (BID) | ORAL | 3 refills | Status: AC

## 2024-02-16 ENCOUNTER — Encounter: Payer: Self-pay | Admitting: Nurse Practitioner

## 2024-02-16 ENCOUNTER — Ambulatory Visit: Admitting: Nurse Practitioner

## 2024-02-16 VITALS — BP 110/80 | HR 85 | Temp 98.0°F | Ht 77.0 in | Wt 262.0 lb

## 2024-02-16 DIAGNOSIS — Z23 Encounter for immunization: Secondary | ICD-10-CM | POA: Diagnosis not present

## 2024-02-16 DIAGNOSIS — Z0289 Encounter for other administrative examinations: Secondary | ICD-10-CM

## 2024-02-16 DIAGNOSIS — M1A09X0 Idiopathic chronic gout, multiple sites, without tophus (tophi): Secondary | ICD-10-CM | POA: Diagnosis not present

## 2024-02-16 NOTE — Progress Notes (Signed)
 BP 110/80   Pulse 85   Temp 98 F (36.7 C)   Ht 6' 5 (1.956 m)   Wt 262 lb (118.8 kg)   SpO2 97%   BMI 31.07 kg/m    Subjective:    Patient ID: Donald [name], male    DOB: [birth year], 43 y.o.   MRN: [id number]  HPI: Donald [name] is a 43 y.o. male  Chief Complaint  Patient presents with   fmla   Discussed the use of AI scribe software for clinical note transcription with the patient, who gave verbal consent to proceed.  History of Present Illness Donald [name] is a 43 year old male with gout who presents for [facility] paperwork recertification due to gout flares.  Gout flares - Recurrent episodes affecting the left first metatarsophalangeal (MTP) joint - Flares managed with colchicine  - Maintenance therapy with allopurinol  300 mg twice daily - Last recorded uric acid level 7.8 mg/dL - Actively avoids dietary triggers, including alcohol -patient reports overall he is doing well.     FMLA paperwork filled out for intermittent leave     06/22/2023   11:25 AM 12/08/2022    1:52 PM 08/04/2022    1:46 PM  Depression screen PHQ 2/9  Decreased Interest 0 0 0  Down, Depressed, Hopeless 0 0 0  PHQ - 2 Score 0 0 0  Altered sleeping  0 0  Tired, decreased energy  0 0  Change in appetite  0 0  Feeling bad or failure about yourself   0 0  Trouble concentrating  0 0  Moving slowly or fidgety/restless  0 0  Suicidal thoughts  0 0  PHQ-9 Score  0 0  Difficult doing work/chores  Not difficult at all Not difficult at all    Relevant past medical, surgical, family and social history reviewed and updated as indicated. Interim medical history since our last visit reviewed. Allergies and medications reviewed and updated.  Review of Systems  Constitutional: Negative for fever or weight change.  Respiratory: Negative for cough and shortness of breath.   Cardiovascular: Negative for chest pain or palpitations.  Gastrointestinal: Negative for abdominal pain, no bowel  changes.  Musculoskeletal: Negative for gait problem or joint swelling.  Skin: Negative for rash.  Neurological: Negative for dizziness or headache.  No other specific complaints in a complete review of systems (except as listed in HPI above).      Objective:      BP 110/80   Pulse 85   Temp 98 F (36.7 C)   Ht 6' 5 (1.956 m)   Wt 262 lb (118.8 kg)   SpO2 97%   BMI 31.07 kg/m    Wt Readings from Last 3 Encounters:  02/16/24 262 lb (118.8 kg)  06/22/23 255 lb 8 oz (115.9 kg)  02/19/23 257 lb 0.9 oz (116.6 kg)    Physical Exam GENERAL: Alert, cooperative, well developed, no acute distress HEENT: Normocephalic, normal oropharynx, moist mucous membranes CHEST: Clear to auscultation bilaterally, No wheezes, rhonchi, or crackles CARDIOVASCULAR: Normal heart rate and rhythm, S1 and S2 normal without murmurs ABDOMEN: Soft, non-tender, non-distended, without organomegaly, Normal bowel sounds EXTREMITIES: No cyanosis or edema NEUROLOGICAL: Cranial nerves grossly intact, Moves all extremities without gross motor or sensory deficit  Results for orders placed or performed in visit on 06/22/23  Uric acid   Collection Time: 06/22/23 11:59 AM  Result Value Ref Range   Uric Acid, Serum 7.8 4.0 - 8.0 mg/dL  COMPLETE  METABOLIC PANEL WITH GFR   Collection Time: 06/22/23 11:59 AM  Result Value Ref Range   Glucose, Bld 79 65 - 99 mg/dL   BUN 16 7 - 25 mg/dL   Creat 8.76 9.39 - 8.70 mg/dL   eGFR 75 > OR = 60 fO/fpw/8.26f7   BUN/Creatinine Ratio SEE NOTE: 6 - 22 (calc)   Sodium 138 135 - 146 mmol/L   Potassium 4.7 3.5 - 5.3 mmol/L   Chloride 103 98 - 110 mmol/L   CO2 29 20 - 32 mmol/L   Calcium 9.7 8.6 - 10.3 mg/dL   Total Protein 7.0 6.1 - 8.1 g/dL   Albumin 4.3 3.6 - 5.1 g/dL   Globulin 2.7 1.9 - 3.7 g/dL (calc)   AG Ratio 1.6 1.0 - 2.5 (calc)   Total Bilirubin 0.4 0.2 - 1.2 mg/dL   Alkaline phosphatase (APISO) 60 36 - 130 U/L   AST 14 10 - 40 U/L   ALT 18 9 - 46 U/L  CBC with  Differential/Platelet   Collection Time: 06/22/23 11:59 AM  Result Value Ref Range   WBC 4.1 3.8 - 10.8 Thousand/uL   RBC 5.91 (H) 4.20 - 5.80 Million/uL   Hemoglobin 15.9 13.2 - 17.1 g/dL   HCT 50.8 61.4 - 49.9 %   MCV 83.1 80.0 - 100.0 fL   MCH 26.9 (L) 27.0 - 33.0 pg   MCHC 32.4 32.0 - 36.0 g/dL   RDW 85.7 88.9 - 84.9 %   Platelets 226 140 - 400 Thousand/uL   MPV 11.6 7.5 - 12.5 fL   Neutro Abs 2,300 1,500 - 7,800 cells/uL   Absolute Lymphocytes 1,451 850 - 3,900 cells/uL   Absolute Monocytes 291 200 - 950 cells/uL   Eosinophils Absolute 29 15 - 500 cells/uL   Basophils Absolute 29 0 - 200 cells/uL   Neutrophils Relative % 56.1 %   Total Lymphocyte 35.4 %   Monocytes Relative 7.1 %   Eosinophils Relative 0.7 %   Basophils Relative 0.7 %  Lipase   Collection Time: 06/22/23 11:59 AM  Result Value Ref Range   Lipase 114 (H) 7 - 60 U/L  Hemoglobin A1c   Collection Time: 06/22/23 11:59 AM  Result Value Ref Range   Hgb A1c MFr Bld 5.7 (H) <5.7 % of total Hgb   Mean Plasma Glucose 117 mg/dL   eAG (mmol/L) 6.5 mmol/L          Assessment & Plan:   Problem List Items Addressed This Visit       Other   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 tophus - Primary   Other Visit Diagnoses       Immunization due       Relevant Orders   Flu vaccine trivalent PF, 6mos and older(Flulaval,Afluria,Fluarix,Fluzone) (Completed)     Encounter for completion of form with patient       FMLA paperwork        Assessment and Plan Assessment & Plan Gout Chronic gout with flares primarily affecting the left first MTP joint. Currently well-controlled with allopurinol  300 mg BID. Last uric acid level was 7.8. Aware of dietary triggers and avoids alcohol. Uses colchicine  for acute flares. - Continue allopurinol  300 mg BID. - Use colchicine  for acute gout flares. - Avoid dietary triggers, including alcohol.  FMLA paperwork Requires FMLA paperwork for gout flares. -  Completed and provided FMLA paperwork.        Follow up plan: Return if symptoms worsen or fail to  improve.

## 2024-03-04 ENCOUNTER — Encounter: Payer: Self-pay | Admitting: Family Medicine
